# Patient Record
Sex: Male | Born: 1935
Health system: Southern US, Community
[De-identification: ages and names within clinical notes are randomized; demographics above are authoritative.]

## PROBLEM LIST (undated history)

## (undated) DIAGNOSIS — I10 Essential (primary) hypertension: Secondary | ICD-10-CM

## (undated) DIAGNOSIS — R600 Localized edema: Secondary | ICD-10-CM

## (undated) DIAGNOSIS — E785 Hyperlipidemia, unspecified: Secondary | ICD-10-CM

## (undated) DIAGNOSIS — I639 Cerebral infarction, unspecified: Secondary | ICD-10-CM

## (undated) HISTORY — PX: CHOLECYSTECTOMY: SHX55

## (undated) HISTORY — PX: ELBOW FRACTURE SURGERY: SHX616

## (undated) HISTORY — PX: TONSILLECTOMY AND ADENOIDECTOMY: SHX28

## (undated) HISTORY — PX: COLONOSCOPY: SHX174

## (undated) HISTORY — DX: Hyperlipidemia, unspecified: E78.5

## (undated) HISTORY — PX: APPENDECTOMY: SHX54

## (undated) HISTORY — DX: Localized edema: R60.0

---
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
## 2001-01-17 ENCOUNTER — Encounter: Admission: RE | Admit: 2001-01-17 | Discharge: 2001-01-17 | Payer: Self-pay | Admitting: *Deleted

## 2002-01-23 ENCOUNTER — Ambulatory Visit (HOSPITAL_COMMUNITY): Admission: RE | Admit: 2002-01-23 | Discharge: 2002-01-23 | Payer: Self-pay | Admitting: *Deleted

## 2004-08-16 ENCOUNTER — Ambulatory Visit: Payer: Self-pay | Admitting: Internal Medicine

## 2004-10-18 ENCOUNTER — Inpatient Hospital Stay (HOSPITAL_COMMUNITY): Admission: AD | Admit: 2004-10-18 | Discharge: 2004-10-22 | Payer: Self-pay | Admitting: Internal Medicine

## 2004-10-18 ENCOUNTER — Ambulatory Visit: Payer: Self-pay | Admitting: Internal Medicine

## 2004-10-19 ENCOUNTER — Encounter (INDEPENDENT_AMBULATORY_CARE_PROVIDER_SITE_OTHER): Payer: Self-pay | Admitting: *Deleted

## 2004-10-22 ENCOUNTER — Ambulatory Visit: Payer: Self-pay | Admitting: Internal Medicine

## 2004-11-16 ENCOUNTER — Ambulatory Visit: Payer: Self-pay | Admitting: Internal Medicine

## 2005-03-22 ENCOUNTER — Ambulatory Visit: Payer: Self-pay | Admitting: Internal Medicine

## 2005-06-22 ENCOUNTER — Ambulatory Visit: Payer: Self-pay | Admitting: Internal Medicine

## 2005-06-29 ENCOUNTER — Ambulatory Visit: Payer: Self-pay | Admitting: Internal Medicine

## 2005-09-21 ENCOUNTER — Ambulatory Visit: Payer: Self-pay | Admitting: Internal Medicine

## 2005-12-30 ENCOUNTER — Ambulatory Visit: Payer: Self-pay | Admitting: Internal Medicine

## 2006-03-30 ENCOUNTER — Ambulatory Visit: Payer: Self-pay | Admitting: Internal Medicine

## 2006-06-29 ENCOUNTER — Ambulatory Visit: Payer: Self-pay | Admitting: Internal Medicine

## 2006-10-17 ENCOUNTER — Ambulatory Visit: Payer: Self-pay | Admitting: Internal Medicine

## 2006-10-17 LAB — CONVERTED CEMR LAB
AST: 26 units/L (ref 0–37)
Alkaline Phosphatase: 75 units/L (ref 39–117)
Calcium: 9.5 mg/dL (ref 8.4–10.5)
Chloride: 103 meq/L (ref 96–112)
Eosinophils Absolute: 0.2 10*3/uL (ref 0.0–0.6)
Eosinophils Relative: 2.5 % (ref 0.0–5.0)
GFR calc Af Amer: 123 mL/min
GFR calc non Af Amer: 102 mL/min
HDL: 28.3 mg/dL — ABNORMAL LOW (ref >39.0)
Hemoglobin: 15.6 g/dL (ref 13.0–17.0)
Hgb A1c MFr Bld: 6.9 % — ABNORMAL HIGH (ref 4.6–6.0)
Lymphocytes Relative: 23.6 % (ref 12.0–46.0)
MCV: 88.8 fL (ref 78.0–100.0)
Microalb, Ur: 0.8 mg/dL (ref 0.0–1.9)
Monocytes Absolute: 0.6 10*3/uL (ref 0.2–0.7)
Neutro Abs: 4.6 10*3/uL (ref 1.4–7.7)
Neutrophils Relative %: 65.5 % (ref 43.0–77.0)
Potassium: 4.4 meq/L (ref 3.5–5.1)
RDW: 12.3 % (ref 11.5–14.6)
TSH: 2.05 microintl units/mL (ref 0.35–5.50)
Total Bilirubin: 0.8 mg/dL (ref 0.3–1.2)
Triglycerides: 117 mg/dL (ref 0–149)
WBC: 7.1 10*3/uL (ref 4.5–10.5)

## 2006-10-24 ENCOUNTER — Ambulatory Visit: Payer: Self-pay | Admitting: Internal Medicine

## 2006-12-04 ENCOUNTER — Ambulatory Visit: Payer: Self-pay | Admitting: Internal Medicine

## 2006-12-06 ENCOUNTER — Encounter: Payer: Self-pay | Admitting: Internal Medicine

## 2006-12-06 DIAGNOSIS — E119 Type 2 diabetes mellitus without complications: Secondary | ICD-10-CM | POA: Insufficient documentation

## 2006-12-06 DIAGNOSIS — H409 Unspecified glaucoma: Secondary | ICD-10-CM | POA: Insufficient documentation

## 2006-12-06 DIAGNOSIS — N400 Enlarged prostate without lower urinary tract symptoms: Secondary | ICD-10-CM | POA: Insufficient documentation

## 2007-01-29 ENCOUNTER — Encounter: Admission: RE | Admit: 2007-01-29 | Discharge: 2007-02-16 | Payer: Self-pay | Admitting: Orthopedic Surgery

## 2008-05-08 ENCOUNTER — Encounter: Admission: RE | Admit: 2008-05-08 | Discharge: 2008-05-08 | Payer: Self-pay | Admitting: Internal Medicine

## 2008-05-09 ENCOUNTER — Encounter: Admission: RE | Admit: 2008-05-09 | Discharge: 2008-05-09 | Payer: Self-pay | Admitting: Orthopedic Surgery

## 2008-05-14 ENCOUNTER — Ambulatory Visit (HOSPITAL_COMMUNITY): Admission: RE | Admit: 2008-05-14 | Discharge: 2008-05-15 | Payer: Self-pay | Admitting: Orthopedic Surgery

## 2010-11-30 NOTE — Op Note (Signed)
NAMECEDARIUS, KERSH NO.:  [id number]   MEDICAL RECORD NO.:  [id number]          PATIENT TYPE:  OIB   LOCATION:                               FACILITY:  [facility]   PHYSICIAN:  Madelynn Done, MD  DATE OF BIRTH:  May 04, 1934   DATE OF PROCEDURE:  05/14/2008  DATE OF DISCHARGE:                               OPERATIVE REPORT   PREOPERATIVE DIAGNOSES:  1. Right elbow, common type 3 radial head fracture.  2. Right elbow coronoid fracture.   POSTOPERATIVE DIAGNOSES:  1. Right elbow, common type 3 radial head fracture.  2. Right elbow coronoid fracture.   ATTENDING SURGEON:  Sharma Covert IV, MD, who was scrubbed and present  the entire procedure.   ASSISTANT SURGEON:  None.   SURGICAL PROCEDURES:  1. Open treatment of proximal radius fracture with a radial head      arthroplasty.  2. Closed treatment of right proximal olecranon fracture, coronoid      fracture.  3. Stress radiography, 3 views right elbow.  4. Surgical implant neck and 8 mm thick.   TOURNIQUET TIME:  250 mmHg.   INDICATIONS:  Mr. Derrick Wilson is a 77 year old right-hand-dominant gentleman  who sustained injury to his right arm from a fall.  He was evaluated in  the office and noted to have a displaced radial head fracture.  The  patient was counseled about his options, and elected to proceed with the  above procedure.  The benefits and options were discussed in detail with  the patient and signed consent was obtained.   DESCRIPTION OF PROCEDURE:  The patient was properly identified in the  preoperative holding area and mark made with a marker on the right elbow  to indicate correct operative site.  The patient was brought back to the  operating room, placed supine on the anesthesia table, where general  anesthesia was administered via endotracheal tube..  The patient  tolerated this well.  A well-padded tourniquet was then placed on the  right brachium and sealed with 1000 drape.  The patient  received  preoperative antibiotics prior to skin incision.  The right upper  extremity was then prepped and draped in normal sterile fashion.  A time-  out was called and correct site was identified.  The procedure was then  begun.  A lateral approach to the elbow was then used.  Dissection was  carried down through the skin and subcutaneous tissues, Kocher approach.  The interval was identified between the ECU and the interval was then  opened longitudinally to expose the joint.  A capsulotomy was then  carried out and the fracture site was then exposed.  After this exposure  of the fracture site there was noted to have a large degree of  comminution of the radial head.  Attempt was made to reduce the  fragments and was unsuccessful, and it felt necessary to perform then  radial head arthroplasty.  Making a perpendicular cut to the radial  shaft using a small sagittal saw, a radial neck cut was then made.  The  loose fragments  were then removed out of the joint.  The coronoid  fracture was visualized and I was able to continue with the closed  treatment of the coronoid fracture.  Following this, the radial shaft  was then repaired with the appropriate size broach, broaching up to 8  mm.  The head was removed and sized, and found to be 26 mm in diameter.  Following resection of the head with the correct sizing of the head, the  radial neck was then prepared appropriately.  The trial measurements  were then made and it was felt to be necessary for a 26+ to 8-mm stem.  After the trial implant implants were completed, the appropriate implant  was then prepared on the back table, and then placed down the radial  shaft.  The joint was then reduced and the elbow was placed through a  full range of motion with good stability, flexion and extension, as well  as pronation and supination.  Final images were obtained using the mini  C-arm, and felt to be in good position in all planes.  The wound  was  then thoroughly irrigated.  The deep layer of the elbow was then closed  with a 2-0 FiberWire suture and figure-of-eight sutures.  The tourniquet  was then deflated.  The subcutaneous tissues were then closed with 2-0  Vicryl and the skin was then closed with 3-0 nylon horizontal mattress.  Then 20 mL of 0.25% Marcaine were infiltrated locally around the  incision site.  Xeroform dressing, a sterile compressive dressing and a  long-arm splint were then applied.  The patient was then extubated and  taken to the recovery room in good condition.   Intraoperative radiographs revealed the elbow radial head arthroplasty  placed.  Good position and maintenance of the alignment of the coronoid,  and no joint incongruity.   POSTOPERATIVE PLAN:  The patient will be admitted for IV antibiotics and  pain control.  I will see him back in the office in approximately 10  days for radiographs to the elbow and then begin a therapy protocol to  begin motion and use the elbow.      Madelynn Done, MD  Electronically Signed     FWO/MEDQ  D:  05/14/2008  T:  05/14/2008  Job:  [id number]

## 2010-12-03 NOTE — H&P (Signed)
[name], [name] NO.:  [id number]   MEDICAL RECORD NO.:  [id number]          PATIENT TYPE:  INP   LOCATION:  5735                         FACILITY:  [facility]   PHYSICIAN:  Anselm Pancoast. Weatherly, M.D.DATE OF BIRTH:  March 09, 1934   DATE OF ADMISSION:  10/18/2004  DATE OF DISCHARGE:                                HISTORY & PHYSICAL   TOTALLY INAUDIBLE DICTATION      WJW/MEDQ  D:  10/19/2004  T:  10/19/2004  Job:  [id number]

## 2010-12-03 NOTE — Op Note (Signed)
Derrick Wilson, Derrick Wilson NO.:  [id number]   MEDICAL RECORD NO.:  [id number]          PATIENT TYPE:  INP   LOCATION:  5735                         FACILITY:  [facility]   PHYSICIAN:  Anselm Pancoast. Weatherly, M.D.DATE OF BIRTH:  1933-09-27   DATE OF PROCEDURE:  10/19/2004  DATE OF DISCHARGE:                                 OPERATIVE REPORT   PREOPERATIVE DIAGNOSIS:  Acute cholecystitis with stones.   POSTOPERATIVE DIAGNOSIS:  Acute cholecystitis.   OPERATION/PROCEDURE:  Laparoscopic cholecystectomy with cholangiogram.   ANESTHESIA:  General.   SURGEON:  Anselm Pancoast. Zachery Dakins, M.D.   ASSISTANT:  Gabrielle Dare. Janee Morn, M.D.   HISTORY:  Mr. Quame Spratlin is a 77 year old male who is a diabetic was  admitted yesterday afternoon by Dr. Eleonore Chiquito with acute  cholecystitis.  Surgical consultation was requested this morning.  On exam  the patient was significantly tender in the right upper quadrant with some  fullness.  Ultrasound yesterday afternoon had shown an acutely inflamed  gallbladder.  He has had temperature up to about 102 and was started on  Unasyn yesterday and his white count was 17,000 yesterday.  I recommend that  we proceed on with a urgent cholecystectomy.  I saw the patient probably  about 10 a.m. and added him to the OR schedule.  He was given a dose of  Unasyn immediately preoperatively.  His liver tests were normal with the  exception that his bilirubin was about 3 and on ultrasound the gallbladder  was acutely inflamed.  The cystic duct was not dilated and I thought that  this was probably just markedly inflamed gallbladder.   DESCRIPTION OF PROCEDURE:  The patient was given dose of Unasyn immediately  prior to going back to the operative suite and induction of general  anesthesia.  The abdomen was shaved around the port sites and a Betadine  prep and then draped in a sterile manner.   A small incision was made around the infraumbilical area.  The  fascia was  identified and picked up between two Kochers.  A small opening was made and  entered carefully into the peritoneal cavity.  A pursestring suture of 0  Vicryl was placed and the Hasson cannula introduced.  The gallbladder was  acutely inflamed.  All adherent up to the anterior intra-abdominal surface.  Fortunately I could see the falciform ligament subxiphoid area.  A 10 mm  trocar under direct vision was placed carefully at this level.  Next it was  necessary to use hot scissors to kind of free the adhesions from where the  gallbladder was markedly inflamed.  Obviously he has had previous episodes  even though the patient denied it.  He had also had an open appendectomy  years ago. He had a lot of adhesions to that and these were kind of taken  down so that we could get the 5 mm trocar placed by Dr. Janee Morn.  With  this, we then kind of peeled this very thick omentum off the markedly  inflamed gallbladder and we could this frankly nearly necrotic gallbladder.  We peeled the omentum off  medially and laterally and then used a __________  aspirator to aspirate the gallbladder and then could grasp it with grasper.  We continued kind of dissecting free and up and then I put another 5 mm port  to the left of the midline so that I could use it to sucker to kind of push  down on the omentum since visualization was still very limited and we also  had switched to the 30-degree scope.  We then placed the second 5 mm trocar  on the right subcostal area and then kept gradually dissecting, working down  to the proximal portion of the gallbladder.  Finally, we got to the most  dependent portion of the gallbladder and carefully dissected and identified  our little short cystic duct, probably about 1 cm in length and could not  see the cystic artery but I was sure that this was cystic duct since it  joined definitely a very inflamed edematous gallbladder and was placed a  clip flush with the  gallbladder, made a little opening and just proximal put  in a Cook catheter into the proximal cystic duct, held in place with clip.  Cholangiogram was obtained which showed good fill of the extrahepatic  biliary system, a very small cystic duct had been demonstrated with the  ultrasound.  The catheter showed good flow into the small bowel.  It did  show the intrahepatic radicals and we removed the catheter, then kind of  triply clipped the little short cystic duct.   Next, the cystic artery, after dividing it, we could tease and most likely  we clipped both the anterior and posterior branches of the cystic artery  since we did not dissect down in this markedly inflamed area but clipped  these proximally, then divided them.  The gallbladder was very difficult to  remove because of the marked inflammation.  Everything was very woody and at  the most distal portion of the gallbladder, there was a little venous  bleeding up into the liver that took cauterization to get hemostasis.  We  thoroughly irrigated, aspirated, reinspected.  I used a little Surgicel for  a few minutes to hold this and then removed it, cauterized the area again  and finally got good hemostasis.   Next, the gallbladder was then placed into an EndoCatch bag and we switched  the camera to the upper 10 mm port, withdrew the bag containing the  gallbladder and then cultured the bile aerobically and anaerobically.  Next,  we reinserted the Hasson cannula in the umbilicus, went ahead and put a #19  Blake drain through the lateral 5 mm port.  I cut it off and then put in the  gallbladder fossa area and then sutured it to the skin.  Next, the  reinspection and the hemostasis appeared adequate.  I put the camera back to  the upper 10 mm port, withdrew the Hasson cannula and then sutured the  fascia of the umbilicus with an additional figure-of-eight suture of 0 Vicryl, and then removed the 5 mm ports.  The drain was in good  position and  removed the upper 10 mm port.  Subcutaneous wounds were closed with 4-0  Vicryl and Benzoin and Steri-Strips for the skin.   We will continue the patient on broad antibiotic coverage and I do not think  there is any way he will go home tomorrow.  I expect we will keep the drain  in for two or three days and probably  discharge him with the drain.  Hopefully, he will not have any problems with bleeding.  He is not on any  type of anticoagulation and we will continue the PAS stockings.  Check an  Accu-Chek in the recovery room.  He is normally on just an oral insulin  medication but he may require a little insulin coverage if his sugar is over  about 200.       ___________________________________________  Anselm Pancoast. Zachery Dakins, M.D.    WJW/MEDQ  D:  10/19/2004  T:  10/20/2004  Job:  [id number]   cc:   Gordy Savers, M.D. [facility]

## 2010-12-03 NOTE — Discharge Summary (Signed)
NAME:  Derrick Wilson, Derrick Wilson             ACCOUNT NO.:  [id number]   MEDICAL RECORD NO.:  [id number]          PATIENT TYPE:  INP   LOCATION:  5735                         FACILITY:  [facility]   PHYSICIAN:  Valetta Mole. Swords, M.D. [facility] OF BIRTH:  1934/03/24   DATE OF ADMISSION:  10/18/2004  DATE OF DISCHARGE:  10/22/2004                                 DISCHARGE SUMMARY   DISCHARGE DIAGNOSES:  1.  Gangrenous cholecystitis, status post laparoscopic cholecystectomy.  2.  Diabetes.  3.  Hypertension, new start Atenolol.  4.  Glaucoma.  5.  History of appendectomy and tonsillectomy.   DISCHARGE MEDICATIONS:  1.  Metformin 1 gram p.o. every day.  2.  Glyburide 5 mg p.o. every day.  3.  Aspirin 81 mg daily.  4.  Vicodin p.r.n.  5.  Augmentin 500 mg p.o. b.i.d.  6.  Atenolol 25 mg p.o. b.i.d.  7.  He will continue eye drops for glaucoma.   HOSPITAL PROCEDURES:  Laparoscopic cholecystectomy, Jackson-Pratt drain  still in place.   FOLLOWUP PLANS:  1.  Dr. Zachery Dakins, Monday, 12:30 p.m.  2.  He will follow up with Dr. Amador Cunas in 2-3 weeks.   DISCHARGE LABORATORIES:  C-MET, on discharge, significant for bilirubin 1.6,  protein 5.6, albumin 2.2, and calcium 8.3.  CBC, on discharge, with a white  count of 13.1, hemoglobin 13.3, platelet count 255,000.   HOSPITAL COURSE:  The patient was admitted to the hospitalist service, on  October 18, 2004, with presumptive diagnosis of cholecystitis.  The patient  underwent laparoscopic cholecystectomy, after an abnormal ultrasound, after  abnormal laboratories reviewed.  Dr. Zachery Dakins surgeon.  Diabetes stayed well controlled.  Hypertension. The patient started on atenolol 25 mg p.o. every day that  increased to 25 mg p.o. b.i.d. at the time of discharge and follow up with  Dr. Amador Cunas in 2-3 weeks.      BHS/MEDQ  D:  10/22/2004  T:  10/22/2004  Job:  [id number]   cc:   Gordy Savers, M.D. [facility]   Anselm Pancoast. Zachery Dakins, M.D.  [address]  [place]  [place] [address]

## 2010-12-03 NOTE — Procedures (Signed)
[facility]  Patient:    Derrick Wilson, Derrick Wilson Visit Number: [id number] MRN: [id number]          Service Type: END Location: ENDO Attending Physician:  Sabino Gasser Dictated by:   Sabino Gasser, M.D. Proc. Date: 01/23/02 Admit Date:  01/23/2002 Discharge Date: 01/23/2002                             Procedure Report  PROCEDURE:  Colonoscopy.  INDICATION FOR PROCEDURE:  Colon cancer screening, rectal bleeding.  ANESTHESIA:  Demerol 60, Versed 7.5 mg.  DESCRIPTION OF PROCEDURE:  With the patient mildly sedated in the left lateral decubitus position, the Olympus videoscopic colonoscope was inserted in the rectum after a normal rectum exam and passed under direct vision to the cecum. identified by the ileocecal valve and appendiceal orifice. We entered into the terminal ileum which also appeared normal. From this point, the colonoscope was slowly withdrawn taking circumferential views of the entire colonic mucosa, stopping only in the rectum which appeared normal on direct and showed hemorrhoids on retroflexed view. The endoscope was straightened and withdrawn. The patients vital signs and pulse oximeter remained stable. The patient tolerated the procedure well without apparent complications.  FINDINGS:  Internal hemorrhoids, otherwise, unremarkable colonoscopic examination to the cecum.  PLAN:  Have the patient follow-up with me as needed. Dictated by:   Sabino Gasser, M.D. Attending Physician:  Sabino Gasser DD:  01/23/02 TD:  01/26/02 Job: [id number]

## 2010-12-03 NOTE — H&P (Signed)
[name], [name] NO.:  [id number]   MEDICAL RECORD NO.:  [id number]          PATIENT TYPE:  INP   LOCATION:  5735                         FACILITY:  [facility]   PHYSICIAN:  Gordy Savers, M.D. LHCDATE OF BIRTH:  1933-11-14   DATE OF ADMISSION:  10/18/2004  DATE OF DISCHARGE:                                HISTORY & PHYSICAL   CHIEF COMPLAINT:  Right-sided abdominal pain.   HISTORY OF PRESENT ILLNESS:  The patient is a 77 year old gentleman who  presents with a three-day history of right-sided abdominal pain.  This is  described as sharp and fairly constant.  This has been associated with  occasional episodes of emesis, weakness, and anorexia.  He denies any fever  or chills.   PAST MEDICAL HISTORY:  1.  The patient has had a remote appendectomy in 1944 and a tonsillectomy in      1947.  2.  He has a greater than 15-year history of diabetes.  3.  History of glaucoma.   He has no known allergies.   He is a nonsmoker.  No ethanol.   MEDICAL REGIMEN:  1.  Metformin 1 g daily.  2.  Glyburide 5 mg daily.  3.  Aspirin 81 mg daily.  4.  He is on two antiglaucomatous drops.   REVIEW OF SYSTEMS:  Fairly unremarkable.  He is followed by Dr. Linward Foster for his eye care.  Last colonoscopy was in 2002.  Diabetic control  has been good, with his last hemoglobin A1c of 5.9.   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  He  is married, with one son.   FAMILY HISTORY:  Father died at 65 of a stroke.  Mother died at 51.  Three  sisters in good health.  No family history of cancer.   EXAMINATION:  GENERAL:  Moderately overweight male who appeared unwell but  in no acute distress.  VITAL SIGNS:  Temperature 102.1 degrees; weight 248; blood pressure 128/68.  SKIN:  Slightly diaphoretic.  HEAD/NECK:  Revealed no scleral icterus.  Head and neck unremarkable.  NECK:  No bruits or adenopathy.  CHEST:  Clear.  CARDIOVASCULAR:   Revealed normal S1, S2.  Rate was about 100.  ABDOMEN:  Revealed some tenderness along the right midabdominal area, with  some mild voluntary guarding.  No rebound tenderness noted.  Bowel sounds  were present.  EXTREMITIES:  Revealed full peripheral pulses, without edema.  NEUROLOGIC:  Negative.   IMPRESSION:  Fever.  Right-sided abdominal pain.  Rule out cholecystitis.   DISPOSITION:  The patient will be admitted to the hospital, made n.p.o., and  supported with IV fluids.  Laboratory evaluation, including an abdominal  ultrasound, will be reviewed.      PFK/MEDQ  D:  10/18/2004  T:  10/18/2004  Job:  [id number]

## 2011-04-19 LAB — BASIC METABOLIC PANEL
BUN: 20
CO2: 29
Calcium: 9.2
Chloride: 103
Creatinine, Ser: 0.88
GFR calc Af Amer: >60
Glucose, Bld: 179 — ABNORMAL HIGH
Potassium: 4.5
Sodium: 138

## 2011-04-19 LAB — CBC
HCT: 44.7
Hemoglobin: 15
MCV: 89.6
RDW: 13.1

## 2011-04-19 LAB — GLUCOSE, CAPILLARY
Glucose-Capillary: 163 — ABNORMAL HIGH
Glucose-Capillary: 176 — ABNORMAL HIGH
Glucose-Capillary: 211 — ABNORMAL HIGH
Glucose-Capillary: 246 — ABNORMAL HIGH

## 2011-10-31 ENCOUNTER — Encounter: Payer: Self-pay | Admitting: Internal Medicine

## 2011-12-13 ENCOUNTER — Ambulatory Visit (AMBULATORY_SURGERY_CENTER): Payer: Medicare Other

## 2011-12-13 VITALS — Ht 69.0 in | Wt 245.0 lb

## 2011-12-13 DIAGNOSIS — Z1211 Encounter for screening for malignant neoplasm of colon: Secondary | ICD-10-CM

## 2011-12-13 MED ORDER — PEG-KCL-NACL-NASULF-NA ASC-C 100 G PO SOLR: 1.0000 | Freq: Once | ORAL | Status: AC

## 2011-12-13 NOTE — Progress Notes (Signed)
Pt came into the office today for his pre-visit prior to his colonoscopy with Dr Marina Goodell on 12/26/11. He states he had a colonoscopy done 11 years ago but does not remember the doctor's name. Ulis Rias RN

## 2011-12-26 ENCOUNTER — Ambulatory Visit (AMBULATORY_SURGERY_CENTER): Payer: Medicare Other | Admitting: Internal Medicine

## 2011-12-26 ENCOUNTER — Encounter: Payer: Self-pay | Admitting: Internal Medicine

## 2011-12-26 VITALS — BP 124/68 | HR 86 | Temp 98.6°F | Resp 22 | Ht 69.0 in | Wt 245.0 lb

## 2011-12-26 DIAGNOSIS — D126 Benign neoplasm of colon, unspecified: Secondary | ICD-10-CM

## 2011-12-26 DIAGNOSIS — Z1211 Encounter for screening for malignant neoplasm of colon: Secondary | ICD-10-CM

## 2011-12-26 MED ORDER — SODIUM CHLORIDE 0.9 % IV SOLN: 500.0000 mL | INTRAVENOUS | Status: DC

## 2011-12-26 NOTE — Patient Instructions (Signed)
YOU HAD AN ENDOSCOPIC PROCEDURE TODAY AT THE [facility]: Refer to the procedure report that was given to you for any specific questions about what was found during the examination.  If the procedure report does not answer your questions, please call your gastroenterologist to clarify.  If you requested that your care partner not be given the details of your procedure findings, then the procedure report has been included in a sealed envelope for you to review at your convenience later.  YOU SHOULD EXPECT: Some feelings of bloating in the abdomen. Passage of more gas than usual.  Walking can help get rid of the air that was put into your GI tract during the procedure and reduce the bloating. If you had a lower endoscopy (such as a colonoscopy or flexible sigmoidoscopy) you may notice spotting of blood in your stool or on the toilet paper. If you underwent a bowel prep for your procedure, then you may not have a normal bowel movement for a few days.  DIET: Your first meal following the procedure should be a light meal and then it is ok to progress to your normal diet.  A half-sandwich or bowl of soup is an example of a good first meal.  Heavy or fried foods are harder to digest and may make you feel nauseous or bloated.  Likewise meals heavy in dairy and vegetables can cause extra gas to form and this can also increase the bloating.  Drink plenty of fluids but you should avoid alcoholic beverages for 24 hours.  ACTIVITY: Your care partner should take you home directly after the procedure.  You should plan to take it easy, moving slowly for the rest of the day.  You can resume normal activity the day after the procedure however you should NOT DRIVE or use heavy machinery for 24 hours (because of the sedation medicines used during the test).    SYMPTOMS TO REPORT IMMEDIATELY: A gastroenterologist can be reached at any hour.  During normal business hours, 8:30 AM to 5:00 PM Monday through Friday,  call [phone].  After hours and on weekends, please call the GI answering service at [phone] who will take a message and have the physician on call contact you.   Following lower endoscopy (colonoscopy or flexible sigmoidoscopy):  Excessive amounts of blood in the stool  Significant tenderness or worsening of abdominal pains  Swelling of the abdomen that is new, acute  Fever of 100F or higher    FOLLOW UP: If any biopsies were taken you will be contacted by phone or by letter within the next 1-3 weeks.  Call your gastroenterologist if you have not heard about the biopsies in 3 weeks.  Our staff will call the home number listed on your records the next business day following your procedure to check on you and address any questions or concerns that you may have at that time regarding the information given to you following your procedure. This is a courtesy call and so if there is no answer at the home number and we have not heard from you through the emergency physician on call, we will assume that you have returned to your regular daily activities without incident.  SIGNATURES/CONFIDENTIALITY: You and/or your care partner have signed paperwork which will be entered into your electronic medical record.  These signatures attest to the fact that that the information above on your After Visit Summary has been reviewed and is understood.  Full responsibility of the confidentiality   of this discharge information lies with you and/or your care-partner.    INFORMATION ON POLYPS, DIVERTICULOSIS, AND HIGH FIBER DIET GIVEN TO YOU TODAY.   ,

## 2011-12-26 NOTE — Op Note (Signed)
[facility] [address] [place], [place]  [address]  COLONOSCOPY PROCEDURE REPORT  PATIENT:  Derrick Wilson, Derrick Wilson  MR#:  [id number] BIRTHDATE:  [birth year], 78 yrs. old  GENDER:  male ENDOSCOPIST:  Wilhemina Bonito. Eda Keys, MD REF. BY:  Ivery Quale, M.D. PROCEDURE DATE:  12/26/2011 PROCEDURE:  Colonoscopy with snare polypectomy x 1 ASA CLASS:  Class II INDICATIONS:  Routine Risk Screening ; He reports a negative exam about 11 years ago (Dr Virginia Rochester) MEDICATIONS:   MAC sedation, administered by CRNA, propofol (Diprivan) 280 mg IV  DESCRIPTION OF PROCEDURE:   After the risks benefits and alternatives of the procedure were thoroughly explained, informed consent was obtained.  Digital rectal exam was performed and revealed no abnormalities.   The LB CF-H180AL [id number] endoscope was introduced through the anus and advanced to the cecum, which was identified by both the appendix and ileocecal valve, without limitations.  The quality of the prep was excellent, using MoviPrep.  The instrument was then slowly withdrawn as the colon was fully examined. <<PROCEDUREIMAGES>>  FINDINGS:  A diminutive polyp was found in the cecum and snared without cautery. Retrieval was successful.  Mild diverticulosis was found in the sigmoid colon.  Otherwise normal colonoscopy without other polyps, masses, vascular ectasias, or inflammatory changes.   Retroflexed views in the rectum revealed no abnormalities.    The time to cecum =   6:50  minutes. The scope was then withdrawn in   13:40  minutes from the cecum and the procedure completed.  COMPLICATIONS:  None  ENDOSCOPIC IMPRESSION: 1) Diminutive polyp in the cecum - removed 2) Mild diverticulosis in the sigmoid colon 3) Otherwise normal colonoscopy  RECOMMENDATIONS: 1) Return to the care of your primary provider. GI follow up as needed  ______________________________ Wilhemina Bonito. Eda Keys, MD  CC:  Jarome Matin, MD;  The Patient  n. eSIGNED:    Wilhemina Bonito. Eda Keys at 12/26/2011 11:00 AM  Felicity Pellegrini, [phone]

## 2011-12-26 NOTE — Progress Notes (Signed)
Patient did not have preoperative order for IV antibiotic SSI prophylaxis. (G8918)  Patient did not experience any of the following events: a burn prior to discharge; a fall within the facility; wrong site/side/patient/procedure/implant event; or a hospital transfer or hospital admission upon discharge from the facility. (G8907)  

## 2011-12-27 ENCOUNTER — Telehealth: Payer: Self-pay | Admitting: *Deleted

## 2011-12-27 NOTE — Telephone Encounter (Signed)
  Follow up Call-  Call back number 12/26/2011  Post procedure Call Back phone  # [phone]  Permission to leave phone message Yes     No answer, phone rang but no answering machine picked up to leave message.

## 2011-12-30 ENCOUNTER — Encounter: Payer: Self-pay | Admitting: Internal Medicine

## 2014-05-08 ENCOUNTER — Encounter: Payer: Self-pay | Admitting: Podiatry

## 2014-05-08 ENCOUNTER — Ambulatory Visit (INDEPENDENT_AMBULATORY_CARE_PROVIDER_SITE_OTHER): Payer: Medicare Other | Admitting: Podiatry

## 2014-05-08 ENCOUNTER — Ambulatory Visit (INDEPENDENT_AMBULATORY_CARE_PROVIDER_SITE_OTHER): Payer: Medicare Other

## 2014-05-08 VITALS — BP 152/77 | HR 81 | Temp 97.6°F | Resp 14 | Ht 68.5 in | Wt 231.0 lb

## 2014-05-08 DIAGNOSIS — L03032 Cellulitis of left toe: Secondary | ICD-10-CM

## 2014-05-08 DIAGNOSIS — M79675 Pain in left toe(s): Secondary | ICD-10-CM

## 2014-05-08 MED ORDER — CEPHALEXIN 500 MG PO CAPS: 500.0000 mg | ORAL_CAPSULE | Freq: Three times a day (TID) | ORAL | Status: DC

## 2014-05-08 NOTE — Progress Notes (Signed)
Subjective:     Patient ID: Derrick Wilson, male   DOB: [birth year], 80 y.o.   MRN: [id number]  HPI patient presents stating that he thinks he did something to his left big toe around a week ago and the nail has been loose and the toe has been red left big toe. He does have neuropathy Derrick Wilson is not sure but he did dragging against the carpet and may have traumatized   Review of Systems  All other systems reviewed and are negative.      Objective:   Physical Exam  Nursing note and vitals reviewed. Cardiovascular: Intact distal pulses.   Musculoskeletal: Normal range of motion.  Neurological: He is alert.  Skin: Skin is warm and dry.   neurovascular status found to be diminished but intact with diminished sharp tall and vibratory and mild diminishment of pulses bilateral. I noted that the toes are well perfused and I did note mild warmth in the left big toe and extending into the metatarsophalangeal joint. The left big toe is moderately erythematous and the nail is loose with mild localized odor and there is keratotic tissue on the hallux itself     Assessment:     Abscess of the left big nail with possible cellulitic issue or trauma to the left big toe secondary to injury that he was not well aware of do to long-term neuropathy and 25 year history of diabetes    Plan:     H&P and x-ray reviewed. I discussed with him condition and today I infiltrated 100 mg I can Marcaine proximal to the MPJs and I then removed the nail and I did not note any drainage or anything for me to culture. I removed crusted tissue from the left big toe and flushed the area and applied sterile dressing and placed him on cephalexin 500 mg 3 times a day. Gave him strict instructions to check his temperature daily if he should develop any fever or indications of systemic infection he is to go straight to the emergency room or if he should develop redness in his foot or other conditions. I instructed him on soaks and he  will reappoint in 1 week

## 2014-05-08 NOTE — Patient Instructions (Signed)

## 2014-05-08 NOTE — Progress Notes (Signed)
   Subjective:    Patient ID: Derrick Wilson, male    DOB: 17-Jan-1934, 80 y.o.   MRN: [id number]  HPI Comments: Pt states he caught his left 1st toe in the bath rug, 1 week ago.  Pt has treated toe with epsom salt soaks and ice, without improvement.  Pt's left 1st toe is swollen, red and peeling, and the toenail has blood subungal.     Review of Systems  All other systems reviewed and are negative.      Objective:   Physical Exam        Assessment & Plan:

## 2014-05-09 ENCOUNTER — Telehealth: Payer: Self-pay | Admitting: *Deleted

## 2014-05-09 NOTE — Telephone Encounter (Signed)
Patient called wanting to know if he should continue with his therapy till next visit , spoke to Quintarius told him to continue til next visit

## 2014-05-15 ENCOUNTER — Telehealth: Payer: Self-pay | Admitting: *Deleted

## 2014-05-15 ENCOUNTER — Ambulatory Visit (INDEPENDENT_AMBULATORY_CARE_PROVIDER_SITE_OTHER): Payer: Medicare Other

## 2014-05-15 ENCOUNTER — Encounter: Payer: Self-pay | Admitting: Podiatry

## 2014-05-15 ENCOUNTER — Ambulatory Visit (INDEPENDENT_AMBULATORY_CARE_PROVIDER_SITE_OTHER): Payer: Medicare Other | Admitting: Podiatry

## 2014-05-15 VITALS — BP 119/62 | HR 77 | Temp 99.4°F | Resp 16

## 2014-05-15 DIAGNOSIS — L03032 Cellulitis of left toe: Secondary | ICD-10-CM

## 2014-05-15 MED ORDER — DOXYCYCLINE HYCLATE 100 MG PO TABS: 100.0000 mg | ORAL_TABLET | Freq: Two times a day (BID) | ORAL | Status: DC

## 2014-05-15 MED ORDER — LEVOFLOXACIN 500 MG PO TABS: 750.0000 mg | ORAL_TABLET | Freq: Every day | ORAL | Status: DC

## 2014-05-15 NOTE — Progress Notes (Signed)
Subjective:     Patient ID: Derrick Wilson, male   DOB: September 28, 1933, 80 y.o.   MRN: [id number]  HPI patient presents stating maybe it's a little bit better but it still sore. This patient does have 30 year history of diabetes which is been under good control and states that his sugar has been relatively normal the last week. Also states that he has not been taking his temperature as I instructed and I did check it today and it was 99.5   Review of Systems     Objective:   Physical Exam Neurovascular status found to be intact with muscle strength adequate and range of motion within normal limits. Patient is found to have continued calor and continued erythema around the left big toe with no odor noted or drainage and it extends around the metatarsal phalangeal joint patient is not noted to have any erythema or edema into the ankle or lower leg at this time. I did not note any active drainage currently    Assessment:     Continued infective process of the left big toe with no active drainage but still of concern with slight elevation of temperature    Plan:     X-rayed his foot and spent a great deal time educating him that there is a strong possibility he may have some bone infection. We are going to switch him to a much stronger oral antibiotic regimen and I gave him strict instructions to take his temperature every day and to watch the redness pattern on his foot and if it were to advance I want him to go straight to the hospital for IV antibiotics. I also advised that there is a chance he is in the loose his big toe due to the fact that it appears based on serial x-rays that there may be bone infection present. Placed on Levaquin 750 mg daily and doxycycline today and will reappoint in 1 week and he was given my cell number if there should be any issues that occur

## 2014-05-15 NOTE — Patient Instructions (Signed)

## 2014-05-15 NOTE — Telephone Encounter (Signed)
"  I'm calling in regards to two prescriptions that were written for antibiotics.  Patient is asking if he is to take them at the same time or separately."  I told him patient is to take both of them at the same time according to Dr. Mellody Drown note.  I verified with Dr. Paulla Dolly.

## 2014-05-22 ENCOUNTER — Ambulatory Visit (INDEPENDENT_AMBULATORY_CARE_PROVIDER_SITE_OTHER): Payer: Medicare Other | Admitting: Podiatry

## 2014-05-22 ENCOUNTER — Encounter: Payer: Self-pay | Admitting: Podiatry

## 2014-05-22 VITALS — BP 112/58 | HR 75 | Temp 98.9°F | Resp 16

## 2014-05-22 DIAGNOSIS — L03032 Cellulitis of left toe: Secondary | ICD-10-CM

## 2014-05-22 NOTE — Progress Notes (Signed)
Subjective:     Patient ID: Derrick Wilson, male   DOB: [birth year], 80 y.o.   MRN: [id number]  HPIpatient presents stating my foot been feeling a lot better this week and my temperature has been normal and today it was 98.5. I've noticed less drainage swelling and no discomfort   Review of Systems     Objective:   Physical Exam Neurovascular status intact with continued forefoot edema left but significant reduced calor and erythema. The left hallux itself shows no current drainage and there is no crusted tissue with no down of skin noted. There is no proximal edema erythema or lymph no distention noted    Assessment:     Appears to be responding currently oral antibiotic treatment    Plan:     Reviewed condition and continuation of antibiotics for the next 10 days. Reappoint one week or we will reevaluate and re-x-ray the left foot. Instructed if temperature should arise or any indications of cellulitic process to go straight to the emergency room and contact us

## 2014-05-23 ENCOUNTER — Other Ambulatory Visit: Payer: Self-pay | Admitting: *Deleted

## 2014-05-23 NOTE — Telephone Encounter (Signed)
[facility] sent over a refill request for Levofloxacin 500 mg.  Dr. Josephina Shih the refill with 2 additional refills.

## 2014-05-28 ENCOUNTER — Other Ambulatory Visit: Payer: Self-pay | Admitting: Podiatry

## 2014-05-29 ENCOUNTER — Ambulatory Visit (INDEPENDENT_AMBULATORY_CARE_PROVIDER_SITE_OTHER): Payer: Medicare Other

## 2014-05-29 ENCOUNTER — Ambulatory Visit (INDEPENDENT_AMBULATORY_CARE_PROVIDER_SITE_OTHER): Payer: Medicare Other | Admitting: Podiatry

## 2014-05-29 ENCOUNTER — Encounter: Payer: Self-pay | Admitting: Podiatry

## 2014-05-29 VITALS — BP 133/63 | HR 74 | Temp 98.8°F | Resp 16

## 2014-05-29 DIAGNOSIS — R609 Edema, unspecified: Secondary | ICD-10-CM

## 2014-05-29 DIAGNOSIS — L03032 Cellulitis of left toe: Secondary | ICD-10-CM

## 2014-05-29 MED ORDER — LEVOFLOXACIN 500 MG PO TABS: 500.0000 mg | ORAL_TABLET | Freq: Every day | ORAL | Status: DC

## 2014-05-29 MED ORDER — DOXYCYCLINE HYCLATE 100 MG PO TABS: 100.0000 mg | ORAL_TABLET | Freq: Two times a day (BID) | ORAL | Status: DC

## 2014-05-29 NOTE — Progress Notes (Signed)
Subjective:     Patient ID: Derrick Wilson, male   DOB: 1934-03-08, 80 y.o.   MRN: [id number]  HPIpatient states the toe is continuing to improve he still has some swelling in his foot but he's had no discomfort no noted redness has been checking his temperature daily and it's been running normal   Review of Systems     Objective:   Physical Exam Neurovascular status intact with continued forefoot edema left with no drainage currently noted from the left hallux and no indications of proximal edema erythema or drainage noted or systemic signs of infection    Assessment:     Continues to improve from of infection of the left hallux with still the possibility for osteomyelitis based on x-ray evaluation    Plan:     Re-x-rayed today and at this time told him to continue to keep taking anti biotics for several more weeks and at this point I'm very hopeful that this will completely resolve. Due to the edema I did go ahead and I placed him in an Unna boot today and Ace wrap in order to compress the foot and instructed him to keep it on for 3 days. He will take continued  Doxycycline and Levaquin for the next 3 weeks and will be seen back at that time unless there is any issues

## 2014-06-19 ENCOUNTER — Encounter: Payer: Self-pay | Admitting: Podiatry

## 2014-06-19 ENCOUNTER — Ambulatory Visit (INDEPENDENT_AMBULATORY_CARE_PROVIDER_SITE_OTHER): Payer: Medicare Other

## 2014-06-19 ENCOUNTER — Ambulatory Visit (INDEPENDENT_AMBULATORY_CARE_PROVIDER_SITE_OTHER): Payer: Medicare Other | Admitting: Podiatry

## 2014-06-19 VITALS — BP 115/61 | HR 70 | Resp 16

## 2014-06-19 DIAGNOSIS — M79675 Pain in left toe(s): Secondary | ICD-10-CM

## 2014-06-19 DIAGNOSIS — R609 Edema, unspecified: Secondary | ICD-10-CM

## 2014-06-19 DIAGNOSIS — L03032 Cellulitis of left toe: Secondary | ICD-10-CM

## 2014-06-19 NOTE — Progress Notes (Signed)
Subjective:     Patient ID: Derrick Wilson, male   DOB: 1934-06-03, 80 y.o.   MRN: [id number]  HPI patient states my toe seems to be doing much better and I'm not had any drainage or pain with it and I am almost done with my antibiotics   Review of Systems     Objective:   Physical Exam Neurovascular status intact muscle strength was within normal limits and I noted the left hallux the erythema continues to reduce as does the edema with the nailbed growing back with what appears to be good health and no proximal edema erythema or drainage noted currently    Assessment:     Appears to be doing well with infection of the left big toe with possible bone involvement    Plan:     X-rays were reviewed and I did discuss that there continues to be change in the proximal phalanx and distal phalanx and want him to stay on antibiotics at the current time and then we will stop and if redness were to occur swelling or proximal erythema edema it is very likely he will have to have the left hallux amputated for osteomyelitis. Clinically he is doing excellent so  i'm hopeful that this will die out and I am planning on seeing him in 5 weeks but strict instructions that I will see him earlier if any issues should occur and he will stop his antibiotics in 2 weeks and we will see how he does with no antibiotic treatment

## 2014-07-24 ENCOUNTER — Ambulatory Visit: Payer: Medicare Other | Admitting: Podiatry

## 2014-07-24 ENCOUNTER — Ambulatory Visit (INDEPENDENT_AMBULATORY_CARE_PROVIDER_SITE_OTHER): Payer: Medicare Other | Admitting: Podiatry

## 2014-07-24 VITALS — BP 128/66 | HR 58 | Resp 16

## 2014-07-24 DIAGNOSIS — M79675 Pain in left toe(s): Secondary | ICD-10-CM

## 2014-07-24 DIAGNOSIS — L03032 Cellulitis of left toe: Secondary | ICD-10-CM

## 2014-07-25 NOTE — Progress Notes (Signed)
Subjective:     Patient ID: Derrick Wilson, male   DOB: [birth year], 81 y.o.   MRN: [id number]  HPI patient presents stating my big toe is improving with diminished redness and swelling and while it's lower than it should be I'm able to walk without discomfort   Review of Systems     Objective:   Physical Exam Neurovascular status intact with significant improvement in the appearance of the left big toe with no drainage noted and minimal erythema or edema noted. I did note dysfunction of the extensor hallucis longus tendon    Assessment:     Doing well with possibility for osteomyelitic process left big toe that appears to be under control with a probable tear of the extensor hallucis longus tendon which she does not remember any history of trauma    Plan:     Discussed condition and at this point I want to keep him in a good stable shoe and explained if he should start to stumble on his toe we may need to treat the dysfunction of the extensor tendon. We will watch the infective condition and if any issues should occur he is to let us know immediately

## 2015-09-03 DIAGNOSIS — I10 Essential (primary) hypertension: Secondary | ICD-10-CM | POA: Diagnosis not present

## 2015-09-03 DIAGNOSIS — E1151 Type 2 diabetes mellitus with diabetic peripheral angiopathy without gangrene: Secondary | ICD-10-CM | POA: Diagnosis not present

## 2015-09-03 DIAGNOSIS — E784 Other hyperlipidemia: Secondary | ICD-10-CM | POA: Diagnosis not present

## 2015-09-03 DIAGNOSIS — Z125 Encounter for screening for malignant neoplasm of prostate: Secondary | ICD-10-CM | POA: Diagnosis not present

## 2015-09-10 DIAGNOSIS — Z6835 Body mass index (BMI) 35.0-35.9, adult: Secondary | ICD-10-CM | POA: Diagnosis not present

## 2015-09-10 DIAGNOSIS — E11319 Type 2 diabetes mellitus with unspecified diabetic retinopathy without macular edema: Secondary | ICD-10-CM | POA: Diagnosis not present

## 2015-09-10 DIAGNOSIS — I7389 Other specified peripheral vascular diseases: Secondary | ICD-10-CM | POA: Diagnosis not present

## 2015-09-10 DIAGNOSIS — E1151 Type 2 diabetes mellitus with diabetic peripheral angiopathy without gangrene: Secondary | ICD-10-CM | POA: Diagnosis not present

## 2015-09-10 DIAGNOSIS — E784 Other hyperlipidemia: Secondary | ICD-10-CM | POA: Diagnosis not present

## 2015-09-10 DIAGNOSIS — Z0000 Encounter for general adult medical examination without abnormal findings: Secondary | ICD-10-CM | POA: Diagnosis not present

## 2015-09-10 DIAGNOSIS — G4733 Obstructive sleep apnea (adult) (pediatric): Secondary | ICD-10-CM | POA: Diagnosis not present

## 2015-09-10 DIAGNOSIS — H4089 Other specified glaucoma: Secondary | ICD-10-CM | POA: Diagnosis not present

## 2015-09-10 DIAGNOSIS — Z1389 Encounter for screening for other disorder: Secondary | ICD-10-CM | POA: Diagnosis not present

## 2015-09-10 DIAGNOSIS — I10 Essential (primary) hypertension: Secondary | ICD-10-CM | POA: Diagnosis not present

## 2015-12-17 DIAGNOSIS — G4733 Obstructive sleep apnea (adult) (pediatric): Secondary | ICD-10-CM | POA: Diagnosis not present

## 2015-12-17 DIAGNOSIS — E1151 Type 2 diabetes mellitus with diabetic peripheral angiopathy without gangrene: Secondary | ICD-10-CM | POA: Diagnosis not present

## 2015-12-17 DIAGNOSIS — Z6835 Body mass index (BMI) 35.0-35.9, adult: Secondary | ICD-10-CM | POA: Diagnosis not present

## 2015-12-17 DIAGNOSIS — I7389 Other specified peripheral vascular diseases: Secondary | ICD-10-CM | POA: Diagnosis not present

## 2015-12-17 DIAGNOSIS — I10 Essential (primary) hypertension: Secondary | ICD-10-CM | POA: Diagnosis not present

## 2015-12-17 DIAGNOSIS — H401122 Primary open-angle glaucoma, left eye, moderate stage: Secondary | ICD-10-CM | POA: Diagnosis not present

## 2015-12-17 DIAGNOSIS — H401111 Primary open-angle glaucoma, right eye, mild stage: Secondary | ICD-10-CM | POA: Diagnosis not present

## 2016-03-18 DIAGNOSIS — E784 Other hyperlipidemia: Secondary | ICD-10-CM | POA: Diagnosis not present

## 2016-03-18 DIAGNOSIS — I7389 Other specified peripheral vascular diseases: Secondary | ICD-10-CM | POA: Diagnosis not present

## 2016-03-18 DIAGNOSIS — G4733 Obstructive sleep apnea (adult) (pediatric): Secondary | ICD-10-CM | POA: Diagnosis not present

## 2016-03-18 DIAGNOSIS — Z6835 Body mass index (BMI) 35.0-35.9, adult: Secondary | ICD-10-CM | POA: Diagnosis not present

## 2016-03-18 DIAGNOSIS — E1151 Type 2 diabetes mellitus with diabetic peripheral angiopathy without gangrene: Secondary | ICD-10-CM | POA: Diagnosis not present

## 2016-03-18 DIAGNOSIS — I10 Essential (primary) hypertension: Secondary | ICD-10-CM | POA: Diagnosis not present

## 2016-04-30 DIAGNOSIS — Z23 Encounter for immunization: Secondary | ICD-10-CM | POA: Diagnosis not present

## 2016-06-17 DIAGNOSIS — E113291 Type 2 diabetes mellitus with mild nonproliferative diabetic retinopathy without macular edema, right eye: Secondary | ICD-10-CM | POA: Diagnosis not present

## 2016-06-17 DIAGNOSIS — E113292 Type 2 diabetes mellitus with mild nonproliferative diabetic retinopathy without macular edema, left eye: Secondary | ICD-10-CM | POA: Diagnosis not present

## 2016-06-17 DIAGNOSIS — H401111 Primary open-angle glaucoma, right eye, mild stage: Secondary | ICD-10-CM | POA: Diagnosis not present

## 2016-07-20 DIAGNOSIS — E1151 Type 2 diabetes mellitus with diabetic peripheral angiopathy without gangrene: Secondary | ICD-10-CM | POA: Diagnosis not present

## 2016-07-20 DIAGNOSIS — E784 Other hyperlipidemia: Secondary | ICD-10-CM | POA: Diagnosis not present

## 2016-07-20 DIAGNOSIS — I7389 Other specified peripheral vascular diseases: Secondary | ICD-10-CM | POA: Diagnosis not present

## 2016-07-20 DIAGNOSIS — I10 Essential (primary) hypertension: Secondary | ICD-10-CM | POA: Diagnosis not present

## 2016-07-20 DIAGNOSIS — Z6835 Body mass index (BMI) 35.0-35.9, adult: Secondary | ICD-10-CM | POA: Diagnosis not present

## 2016-07-20 DIAGNOSIS — Z1389 Encounter for screening for other disorder: Secondary | ICD-10-CM | POA: Diagnosis not present

## 2016-07-20 DIAGNOSIS — G4733 Obstructive sleep apnea (adult) (pediatric): Secondary | ICD-10-CM | POA: Diagnosis not present

## 2016-07-25 DIAGNOSIS — R0683 Snoring: Secondary | ICD-10-CM | POA: Diagnosis not present

## 2016-08-11 ENCOUNTER — Encounter: Payer: Self-pay | Admitting: Podiatry

## 2016-08-11 ENCOUNTER — Ambulatory Visit (INDEPENDENT_AMBULATORY_CARE_PROVIDER_SITE_OTHER): Payer: Medicare Other | Admitting: Podiatry

## 2016-08-11 DIAGNOSIS — B351 Tinea unguium: Secondary | ICD-10-CM | POA: Diagnosis not present

## 2016-08-11 DIAGNOSIS — M79675 Pain in left toe(s): Secondary | ICD-10-CM | POA: Diagnosis not present

## 2016-08-11 NOTE — Progress Notes (Signed)
Subjective:     Patient ID: Derrick Wilson, male   DOB: [birth year], 83 y.o.   MRN: [id number]  HPI patient presents stating the nail second left is loose and he is worried it's going to become infected   Review of Systems     Objective:   Physical Exam Neurovascular status intact with damaged second nail left that's loose and irritated    Assessment:     Damaged second nail left with trauma    Plan:     Carefully remove nail with no drainage underneath and advised on soaks and applied Band-Aid and reappoint as needed for condition

## 2016-12-16 DIAGNOSIS — H401111 Primary open-angle glaucoma, right eye, mild stage: Secondary | ICD-10-CM | POA: Diagnosis not present

## 2017-01-20 DIAGNOSIS — G4733 Obstructive sleep apnea (adult) (pediatric): Secondary | ICD-10-CM | POA: Diagnosis not present

## 2017-01-20 DIAGNOSIS — M25521 Pain in right elbow: Secondary | ICD-10-CM | POA: Diagnosis not present

## 2017-01-20 DIAGNOSIS — I10 Essential (primary) hypertension: Secondary | ICD-10-CM | POA: Diagnosis not present

## 2017-01-20 DIAGNOSIS — E1151 Type 2 diabetes mellitus with diabetic peripheral angiopathy without gangrene: Secondary | ICD-10-CM | POA: Diagnosis not present

## 2017-02-09 DIAGNOSIS — M7711 Lateral epicondylitis, right elbow: Secondary | ICD-10-CM | POA: Diagnosis not present

## 2017-02-10 DIAGNOSIS — I10 Essential (primary) hypertension: Secondary | ICD-10-CM | POA: Diagnosis not present

## 2017-05-02 DIAGNOSIS — G4733 Obstructive sleep apnea (adult) (pediatric): Secondary | ICD-10-CM | POA: Diagnosis not present

## 2017-05-02 DIAGNOSIS — R2681 Unsteadiness on feet: Secondary | ICD-10-CM | POA: Diagnosis not present

## 2017-05-02 DIAGNOSIS — E7849 Other hyperlipidemia: Secondary | ICD-10-CM | POA: Diagnosis not present

## 2017-05-02 DIAGNOSIS — E1151 Type 2 diabetes mellitus with diabetic peripheral angiopathy without gangrene: Secondary | ICD-10-CM | POA: Diagnosis not present

## 2017-05-09 DIAGNOSIS — R2681 Unsteadiness on feet: Secondary | ICD-10-CM | POA: Diagnosis not present

## 2017-05-18 DIAGNOSIS — R2681 Unsteadiness on feet: Secondary | ICD-10-CM | POA: Diagnosis not present

## 2017-05-23 DIAGNOSIS — R2681 Unsteadiness on feet: Secondary | ICD-10-CM | POA: Diagnosis not present

## 2017-05-31 DIAGNOSIS — R2681 Unsteadiness on feet: Secondary | ICD-10-CM | POA: Diagnosis not present

## 2017-06-07 DIAGNOSIS — R2681 Unsteadiness on feet: Secondary | ICD-10-CM | POA: Diagnosis not present

## 2017-06-15 DIAGNOSIS — R2681 Unsteadiness on feet: Secondary | ICD-10-CM | POA: Diagnosis not present

## 2017-06-21 DIAGNOSIS — R2681 Unsteadiness on feet: Secondary | ICD-10-CM | POA: Diagnosis not present

## 2017-06-23 DIAGNOSIS — E113293 Type 2 diabetes mellitus with mild nonproliferative diabetic retinopathy without macular edema, bilateral: Secondary | ICD-10-CM | POA: Diagnosis not present

## 2017-06-23 DIAGNOSIS — H401111 Primary open-angle glaucoma, right eye, mild stage: Secondary | ICD-10-CM | POA: Diagnosis not present

## 2017-06-28 DIAGNOSIS — R2681 Unsteadiness on feet: Secondary | ICD-10-CM | POA: Diagnosis not present

## 2017-07-06 DIAGNOSIS — R2681 Unsteadiness on feet: Secondary | ICD-10-CM | POA: Diagnosis not present

## 2017-07-19 DIAGNOSIS — R2681 Unsteadiness on feet: Secondary | ICD-10-CM | POA: Diagnosis not present

## 2017-07-27 DIAGNOSIS — R2681 Unsteadiness on feet: Secondary | ICD-10-CM | POA: Diagnosis not present

## 2017-08-03 DIAGNOSIS — R2681 Unsteadiness on feet: Secondary | ICD-10-CM | POA: Diagnosis not present

## 2017-08-10 DIAGNOSIS — R2681 Unsteadiness on feet: Secondary | ICD-10-CM | POA: Diagnosis not present

## 2017-08-15 DIAGNOSIS — G4733 Obstructive sleep apnea (adult) (pediatric): Secondary | ICD-10-CM | POA: Diagnosis not present

## 2017-08-15 DIAGNOSIS — R2681 Unsteadiness on feet: Secondary | ICD-10-CM | POA: Diagnosis not present

## 2017-08-15 DIAGNOSIS — I7389 Other specified peripheral vascular diseases: Secondary | ICD-10-CM | POA: Diagnosis not present

## 2017-08-15 DIAGNOSIS — E1151 Type 2 diabetes mellitus with diabetic peripheral angiopathy without gangrene: Secondary | ICD-10-CM | POA: Diagnosis not present

## 2017-08-17 DIAGNOSIS — R2681 Unsteadiness on feet: Secondary | ICD-10-CM | POA: Diagnosis not present

## 2017-08-24 DIAGNOSIS — R2681 Unsteadiness on feet: Secondary | ICD-10-CM | POA: Diagnosis not present

## 2017-08-31 DIAGNOSIS — R2681 Unsteadiness on feet: Secondary | ICD-10-CM | POA: Diagnosis not present

## 2017-11-14 DIAGNOSIS — Z125 Encounter for screening for malignant neoplasm of prostate: Secondary | ICD-10-CM | POA: Diagnosis not present

## 2017-11-14 DIAGNOSIS — E7849 Other hyperlipidemia: Secondary | ICD-10-CM | POA: Diagnosis not present

## 2017-11-14 DIAGNOSIS — I10 Essential (primary) hypertension: Secondary | ICD-10-CM | POA: Diagnosis not present

## 2017-11-14 DIAGNOSIS — R82998 Other abnormal findings in urine: Secondary | ICD-10-CM | POA: Diagnosis not present

## 2017-11-14 DIAGNOSIS — E1151 Type 2 diabetes mellitus with diabetic peripheral angiopathy without gangrene: Secondary | ICD-10-CM | POA: Diagnosis not present

## 2017-11-17 DIAGNOSIS — Z1212 Encounter for screening for malignant neoplasm of rectum: Secondary | ICD-10-CM | POA: Diagnosis not present

## 2017-11-21 DIAGNOSIS — R2681 Unsteadiness on feet: Secondary | ICD-10-CM | POA: Diagnosis not present

## 2017-11-21 DIAGNOSIS — Z794 Long term (current) use of insulin: Secondary | ICD-10-CM | POA: Diagnosis not present

## 2017-11-21 DIAGNOSIS — Z0000 Encounter for general adult medical examination without abnormal findings: Secondary | ICD-10-CM | POA: Diagnosis not present

## 2017-11-21 DIAGNOSIS — R152 Fecal urgency: Secondary | ICD-10-CM | POA: Diagnosis not present

## 2017-12-22 DIAGNOSIS — H401111 Primary open-angle glaucoma, right eye, mild stage: Secondary | ICD-10-CM | POA: Diagnosis not present

## 2018-03-06 DIAGNOSIS — E1151 Type 2 diabetes mellitus with diabetic peripheral angiopathy without gangrene: Secondary | ICD-10-CM | POA: Diagnosis not present

## 2018-03-06 DIAGNOSIS — I7389 Other specified peripheral vascular diseases: Secondary | ICD-10-CM | POA: Diagnosis not present

## 2018-03-06 DIAGNOSIS — Z794 Long term (current) use of insulin: Secondary | ICD-10-CM | POA: Diagnosis not present

## 2018-03-06 DIAGNOSIS — R2681 Unsteadiness on feet: Secondary | ICD-10-CM | POA: Diagnosis not present

## 2018-03-24 DIAGNOSIS — Z23 Encounter for immunization: Secondary | ICD-10-CM | POA: Diagnosis not present

## 2018-06-04 DIAGNOSIS — E1151 Type 2 diabetes mellitus with diabetic peripheral angiopathy without gangrene: Secondary | ICD-10-CM | POA: Diagnosis not present

## 2018-06-04 DIAGNOSIS — G4733 Obstructive sleep apnea (adult) (pediatric): Secondary | ICD-10-CM | POA: Diagnosis not present

## 2018-06-04 DIAGNOSIS — I7389 Other specified peripheral vascular diseases: Secondary | ICD-10-CM | POA: Diagnosis not present

## 2018-06-04 DIAGNOSIS — Z794 Long term (current) use of insulin: Secondary | ICD-10-CM | POA: Diagnosis not present

## 2018-06-21 DIAGNOSIS — E113291 Type 2 diabetes mellitus with mild nonproliferative diabetic retinopathy without macular edema, right eye: Secondary | ICD-10-CM | POA: Diagnosis not present

## 2018-06-21 DIAGNOSIS — H401111 Primary open-angle glaucoma, right eye, mild stage: Secondary | ICD-10-CM | POA: Diagnosis not present

## 2018-07-18 DIAGNOSIS — I639 Cerebral infarction, unspecified: Secondary | ICD-10-CM

## 2018-07-18 HISTORY — DX: Cerebral infarction, unspecified: I63.9

## 2018-07-29 ENCOUNTER — Observation Stay (HOSPITAL_COMMUNITY): Payer: Medicare Other

## 2018-07-29 ENCOUNTER — Emergency Department (HOSPITAL_COMMUNITY): Payer: Medicare Other

## 2018-07-29 ENCOUNTER — Other Ambulatory Visit: Payer: Self-pay

## 2018-07-29 ENCOUNTER — Encounter (HOSPITAL_COMMUNITY): Payer: Self-pay | Admitting: Emergency Medicine

## 2018-07-29 ENCOUNTER — Observation Stay (HOSPITAL_COMMUNITY)
Admission: EM | Admit: 2018-07-29 | Discharge: 2018-07-30 | Disposition: A | Discharge status: Home / Self Care | Payer: Medicare Other | Attending: Family Medicine | Admitting: Family Medicine

## 2018-07-29 DIAGNOSIS — Z7982 Long term (current) use of aspirin: Secondary | ICD-10-CM | POA: Diagnosis not present

## 2018-07-29 DIAGNOSIS — I4510 Unspecified right bundle-branch block: Secondary | ICD-10-CM | POA: Diagnosis not present

## 2018-07-29 DIAGNOSIS — E1165 Type 2 diabetes mellitus with hyperglycemia: Secondary | ICD-10-CM | POA: Diagnosis not present

## 2018-07-29 DIAGNOSIS — R0902 Hypoxemia: Secondary | ICD-10-CM | POA: Diagnosis not present

## 2018-07-29 DIAGNOSIS — Z79899 Other long term (current) drug therapy: Secondary | ICD-10-CM | POA: Diagnosis not present

## 2018-07-29 DIAGNOSIS — R000 Tachycardia, unspecified: Secondary | ICD-10-CM | POA: Diagnosis not present

## 2018-07-29 DIAGNOSIS — R531 Weakness: Secondary | ICD-10-CM | POA: Diagnosis not present

## 2018-07-29 DIAGNOSIS — Z794 Long term (current) use of insulin: Secondary | ICD-10-CM | POA: Diagnosis not present

## 2018-07-29 DIAGNOSIS — E119 Type 2 diabetes mellitus without complications: Secondary | ICD-10-CM | POA: Diagnosis present

## 2018-07-29 DIAGNOSIS — I10 Essential (primary) hypertension: Secondary | ICD-10-CM | POA: Diagnosis not present

## 2018-07-29 DIAGNOSIS — M6281 Muscle weakness (generalized): Secondary | ICD-10-CM | POA: Diagnosis not present

## 2018-07-29 DIAGNOSIS — G459 Transient cerebral ischemic attack, unspecified: Principal | ICD-10-CM | POA: Diagnosis present

## 2018-07-29 DIAGNOSIS — I499 Cardiac arrhythmia, unspecified: Secondary | ICD-10-CM | POA: Diagnosis not present

## 2018-07-29 DIAGNOSIS — I959 Hypotension, unspecified: Secondary | ICD-10-CM | POA: Diagnosis not present

## 2018-07-29 DIAGNOSIS — E1159 Type 2 diabetes mellitus with other circulatory complications: Secondary | ICD-10-CM | POA: Diagnosis not present

## 2018-07-29 DIAGNOSIS — R29818 Other symptoms and signs involving the nervous system: Secondary | ICD-10-CM | POA: Diagnosis not present

## 2018-07-29 DIAGNOSIS — I639 Cerebral infarction, unspecified: Secondary | ICD-10-CM | POA: Diagnosis not present

## 2018-07-29 LAB — DIFFERENTIAL
Abs Immature Granulocytes: 0.22 10*3/uL — ABNORMAL HIGH (ref 0.00–0.07)
Basophils Absolute: 0.1 10*3/uL (ref 0.0–0.1)
Basophils Relative: 0 %
EOS ABS: 0.1 10*3/uL (ref 0.0–0.5)
Eosinophils Relative: 1 %
Immature Granulocytes: 2 %
LYMPHS ABS: 1 10*3/uL (ref 0.7–4.0)
Lymphocytes Relative: 8 %
Monocytes Absolute: 0.6 10*3/uL (ref 0.1–1.0)
Monocytes Relative: 5 %
Neutro Abs: 9.6 10*3/uL — ABNORMAL HIGH (ref 1.7–7.7)
Neutrophils Relative %: 84 %

## 2018-07-29 LAB — COMPREHENSIVE METABOLIC PANEL
ALT: 21 U/L (ref 0–44)
AST: 30 U/L (ref 15–41)
Albumin: 3.7 g/dL (ref 3.5–5.0)
Alkaline Phosphatase: 81 U/L (ref 38–126)
Anion gap: 14 (ref 5–15)
BILIRUBIN TOTAL: 1.4 mg/dL — AB (ref 0.3–1.2)
BUN: 16 mg/dL (ref 8–23)
CO2: 19 mmol/L — ABNORMAL LOW (ref 22–32)
Calcium: 9.3 mg/dL (ref 8.9–10.3)
Chloride: 105 mmol/L (ref 98–111)
Creatinine, Ser: 1.23 mg/dL (ref 0.61–1.24)
GFR calc Af Amer: >60 mL/min (ref >60)
GFR calc non Af Amer: 54 mL/min — ABNORMAL LOW (ref >60)
Glucose, Bld: 269 mg/dL — ABNORMAL HIGH (ref 70–99)
POTASSIUM: 4.1 mmol/L (ref 3.5–5.1)
Sodium: 138 mmol/L (ref 135–145)
Total Protein: 6.1 g/dL — ABNORMAL LOW (ref 6.5–8.1)

## 2018-07-29 LAB — I-STAT CHEM 8, ED
BUN: 19 mg/dL (ref 8–23)
Calcium, Ion: 1.09 mmol/L — ABNORMAL LOW (ref 1.15–1.40)
Chloride: 105 mmol/L (ref 98–111)
Creatinine, Ser: 1.1 mg/dL (ref 0.61–1.24)
Glucose, Bld: 269 mg/dL — ABNORMAL HIGH (ref 70–99)
HCT: 49 % (ref 39.0–52.0)
Hemoglobin: 16.7 g/dL (ref 13.0–17.0)
Potassium: 4.1 mmol/L (ref 3.5–5.1)
Sodium: 139 mmol/L (ref 135–145)
TCO2: 19 mmol/L — ABNORMAL LOW (ref 22–32)

## 2018-07-29 LAB — PROTIME-INR
INR: 1.02
Prothrombin Time: 13.3 seconds (ref 11.4–15.2)

## 2018-07-29 LAB — LIPID PANEL
CHOLESTEROL: 97 mg/dL (ref 0–200)
HDL: 28 mg/dL — ABNORMAL LOW (ref >40)
LDL Cholesterol: 47 mg/dL (ref 0–99)
Total CHOL/HDL Ratio: 3.5 RATIO
Triglycerides: 111 mg/dL (ref <150)
VLDL: 22 mg/dL (ref 0–40)

## 2018-07-29 LAB — HEMOGLOBIN A1C
Hgb A1c MFr Bld: 8.5 % — ABNORMAL HIGH (ref 4.8–5.6)
Mean Plasma Glucose: 197.25 mg/dL

## 2018-07-29 LAB — APTT: aPTT: 24 seconds (ref 24–36)

## 2018-07-29 LAB — CBC
HCT: 50.9 % (ref 39.0–52.0)
Hemoglobin: 16.3 g/dL (ref 13.0–17.0)
MCH: 29.8 pg (ref 26.0–34.0)
MCHC: 32 g/dL (ref 30.0–36.0)
MCV: 93.1 fL (ref 80.0–100.0)
PLATELETS: 174 10*3/uL (ref 150–400)
RBC: 5.47 MIL/uL (ref 4.22–5.81)
RDW: 12.7 % (ref 11.5–15.5)
WBC: 11.5 10*3/uL — ABNORMAL HIGH (ref 4.0–10.5)
nRBC: 0 % (ref 0.0–0.2)

## 2018-07-29 LAB — ETHANOL: Alcohol, Ethyl (B): <10 mg/dL (ref <10)

## 2018-07-29 LAB — I-STAT TROPONIN, ED: Troponin i, poc: 0.01 ng/mL (ref 0.00–0.08)

## 2018-07-29 MED ORDER — LATANOPROST 0.005 % OP SOLN
1.0000 [drp] | Freq: Every day | OPHTHALMIC | Status: DC
  Filled 2018-07-29: qty 2.5

## 2018-07-29 MED ORDER — TIMOLOL MALEATE 0.25 % OP SOLN
1.0000 [drp] | Freq: Every day | OPHTHALMIC | Status: DC
  Administered 2018-07-30: 1 [drp] via OPHTHALMIC
  Filled 2018-07-29: qty 5

## 2018-07-29 MED ORDER — ACETAMINOPHEN 160 MG/5ML PO SOLN: 650.0000 mg | ORAL | Status: DC | PRN

## 2018-07-29 MED ORDER — ACETAMINOPHEN 325 MG PO TABS: 650.0000 mg | ORAL_TABLET | ORAL | Status: DC | PRN

## 2018-07-29 MED ORDER — SODIUM CHLORIDE 0.9 % IV SOLN
INTRAVENOUS | Status: DC
  Administered 2018-07-30: 1000 mL via INTRAVENOUS
  Administered 2018-07-30: 11:00:00 via INTRAVENOUS

## 2018-07-29 MED ORDER — ASPIRIN 325 MG PO TABS: 325.0000 mg | ORAL_TABLET | Freq: Every day | ORAL | Status: DC

## 2018-07-29 MED ORDER — IOPAMIDOL (ISOVUE-370) INJECTION 76%
INTRAVENOUS | Status: AC
  Filled 2018-07-29: qty 100

## 2018-07-29 MED ORDER — LACTATED RINGERS IV BOLUS
1000.0000 mL | Freq: Once | INTRAVENOUS | Status: AC
  Administered 2018-07-29: 1000 mL via INTRAVENOUS

## 2018-07-29 MED ORDER — BRIMONIDINE TARTRATE 0.15 % OP SOLN
1.0000 [drp] | Freq: Every day | OPHTHALMIC | Status: DC
  Filled 2018-07-29: qty 5

## 2018-07-29 MED ORDER — ACETAMINOPHEN 650 MG RE SUPP: 650.0000 mg | RECTAL | Status: DC | PRN

## 2018-07-29 MED ORDER — ATORVASTATIN CALCIUM 10 MG PO TABS
20.0000 mg | ORAL_TABLET | Freq: Every evening | ORAL | Status: DC
  Filled 2018-07-29: qty 2

## 2018-07-29 MED ORDER — LOSARTAN POTASSIUM 50 MG PO TABS: 100.0000 mg | ORAL_TABLET | Freq: Every day | ORAL | Status: DC

## 2018-07-29 MED ORDER — INSULIN ASPART 100 UNIT/ML ~~LOC~~ SOLN
0.0000 [IU] | Freq: Three times a day (TID) | SUBCUTANEOUS | Status: DC
  Administered 2018-07-30: 5 [IU] via SUBCUTANEOUS

## 2018-07-29 MED ORDER — ENOXAPARIN SODIUM 40 MG/0.4ML ~~LOC~~ SOLN
40.0000 mg | SUBCUTANEOUS | Status: DC
  Administered 2018-07-30: 40 mg via SUBCUTANEOUS
  Filled 2018-07-29: qty 0.4

## 2018-07-29 MED ORDER — IOPAMIDOL (ISOVUE-370) INJECTION 76%
75.0000 mL | Freq: Once | INTRAVENOUS | Status: AC | PRN
  Administered 2018-07-29: 75 mL via INTRAVENOUS

## 2018-07-29 MED ORDER — STROKE: EARLY STAGES OF RECOVERY BOOK
Freq: Once | Status: AC
  Administered 2018-07-30: 02:00:00
  Filled 2018-07-29: qty 1

## 2018-07-29 MED ORDER — ASPIRIN 300 MG RE SUPP: 300.0000 mg | Freq: Every day | RECTAL | Status: DC

## 2018-07-29 MED ORDER — INSULIN GLARGINE 100 UNIT/ML ~~LOC~~ SOLN
20.0000 [IU] | Freq: Every day | SUBCUTANEOUS | Status: DC
  Administered 2018-07-30: 20 [IU] via SUBCUTANEOUS
  Filled 2018-07-29: qty 0.2

## 2018-07-29 NOTE — ED Notes (Signed)
ED Provider at bedside. 

## 2018-07-29 NOTE — ED Notes (Signed)
Pt taken to MRI from CT, Per Desirae in MRI she will transport pt to 3W after MRI

## 2018-07-29 NOTE — ED Notes (Signed)
Attempted report to 3W unsuccessful

## 2018-07-29 NOTE — H&P (Signed)
History and Physical    Derrick Wilson BTD:[id number] DOB: 11/16/1933 DOA: 07/29/2018  PCP: Leanna Battles, MD  Patient coming from: Home.  Chief Complaint: Left lower extremity weakness.  HPI: Derrick Wilson is a 85 y.o. male with history of hypertension, hyperlipidemia, diabetes mellitus type 2 started developing sudden onset of left lower extremity weakness around 7 PM tonight.  Patient was not able to walk when this happened and was trying to move the trash can.  Neighbors came to help him and EMS was called.  Patient did not have any difficulty with speaking swallowing or any weakness of the other extremities or any visual symptoms.  ED Course: In the ER patient was mildly hypotensive and was given fluid bolus.  Patient states he has not eaten the whole day.  Blood sugars were high.  CT head followed by CT angiogram of the head and neck and MRI of the brain done were negative.  Initially when patient came he was weak on the left side and by the time he was getting a CT his symptoms completely resolved.  Neurology on-call was consulted and patient admitted for TIA work-up.  Review of Systems: As per HPI, rest all negative.   Past Medical History:  Diagnosis Date  . Diabetes mellitus   . Edema leg    legs   . Glaucoma   . Hyperlipidemia     Past Surgical History:  Procedure Laterality Date  . APPENDECTOMY    . CHOLECYSTECTOMY    . COLONOSCOPY    . TONSILLECTOMY AND ADENOIDECTOMY       reports that he has never smoked. He has never used smokeless tobacco. He reports that he does not drink alcohol or use drugs.  No Known Allergies  Family History  Problem Relation Age of Onset  . Diabetes Father     Prior to Admission medications   Medication Sig Start Date End Date Taking? Authorizing Provider  aspirin 81 MG tablet Take 81 mg by mouth every evening.    Yes [provider]  atorvastatin (LIPITOR) 20 MG tablet Take 20 mg by mouth every evening.   Yes  [provider]  brimonidine (ALPHAGAN P) 0.15 % ophthalmic solution Place 1 drop into both eyes at bedtime. Use one drop each eye bid    Yes [provider]  Canagliflozin (INVOKANA) 100 MG TABS Take 100 mg by mouth every morning.    Yes [provider]  glipiZIDE (GLUCOTROL XL) 5 MG 24 hr tablet Take 5 mg by mouth daily with breakfast.   Yes [provider]  insulin glargine (LANTUS) 100 UNIT/ML injection Inject 20 Units into the skin every morning.    Yes [provider]  latanoprost (XALATAN) 0.005 % ophthalmic solution Place 1 drop into both eyes at bedtime.   Yes [provider]  losartan (COZAAR) 100 MG tablet Take 100 mg by mouth every morning.    Yes [provider]  metFORMIN (GLUCOPHAGE) 1000 MG tablet Take 1,000 mg by mouth at bedtime.    Yes [provider]  Multiple Vitamin (MULTIVITAMIN) tablet Take 1 tablet by mouth every morning. Mature multi-vitamin and minerals   Yes [provider]  OMEGA-3 KRILL OIL PO Take 1 capsule by mouth every morning.   Yes [provider]  timolol (TIMOPTIC) 0.25 % ophthalmic solution Place 1 drop into both eyes every morning.    Yes [provider]  glucose blood test strip 1 each by Other route  as needed. Use as instructed    [provider]  [facility] DELICA LANCETS MISC by Does not apply route daily.    [provider]    Physical Exam: Vitals:   07/29/18 2100 07/29/18 2115 07/29/18 2130 07/29/18 2145  BP: (!) 100/58 112/70 103/62 125/61  Pulse: 90 87 92 95  Resp: (!) 22 17 14  (!) 24  Temp:      SpO2: 94% 94% 96% 95%  Weight:          Constitutional: Moderately built and nourished. Vitals:   07/29/18 2100 07/29/18 2115 07/29/18 2130 07/29/18 2145  BP: (!) 100/58 112/70 103/62 125/61  Pulse: 90 87 92 95  Resp: (!) 22 17 14  (!) 24  Temp:      SpO2: 94% 94% 96% 95%  Weight:       Eyes: Anicteric no pallor. ENMT: No  discharge from the ears eyes nose or mouth. Neck: No mass felt.  No neck rigidity.  No JVD appreciated. Respiratory: No rhonchi or crepitations. Cardiovascular: S1-S2 heard.  Abdomen: Soft nontender bowel sounds present. Musculoskeletal: No edema.  No joint effusion. Skin: No rash. Neurologic: Alert awake oriented to time place and person.  Moves all extremities 5 x 5.  No facial asymmetry tongue is midline. Psychiatric: Appears normal.  Normal affect.   Labs on Admission: I have personally reviewed following labs and imaging studies  CBC: Recent Labs  Lab 07/29/18 2020 07/29/18 2032  WBC 11.5*  --   NEUTROABS 9.6*  --   HGB 16.3 16.7  HCT 50.9 49.0  MCV 93.1  --   PLT 174  --    Basic Metabolic Panel: Recent Labs  Lab 07/29/18 2020 07/29/18 2032  NA 138 139  K 4.1 4.1  CL 105 105  CO2 19*  --   GLUCOSE 269* 269*  BUN 16 19  CREATININE 1.23 1.10  CALCIUM 9.3  --    GFR: CrCl cannot be calculated (Unknown ideal weight.). Liver Function Tests: Recent Labs  Lab 07/29/18 2020  AST 30  ALT 21  ALKPHOS 81  BILITOT 1.4*  PROT 6.1*  ALBUMIN 3.7   No results for input(s): LIPASE, AMYLASE in the last 168 hours. No results for input(s): AMMONIA in the last 168 hours. Coagulation Profile: Recent Labs  Lab 07/29/18 2020  INR 1.02   Cardiac Enzymes: No results for input(s): CKTOTAL, CKMB, CKMBINDEX, TROPONINI in the last 168 hours. BNP (last 3 results) No results for input(s): PROBNP in the last 8760 hours. HbA1C: Recent Labs    07/29/18 2020  HGBA1C 8.5*   CBG: No results for input(s): GLUCAP in the last 168 hours. Lipid Profile: Recent Labs    07/29/18 2112  CHOL 97  HDL 28*  LDLCALC 47  TRIG 111  CHOLHDL 3.5   Thyroid Function Tests: No results for input(s): TSH, T4TOTAL, FREET4, T3FREE, THYROIDAB in the last 72 hours. Anemia Panel: No results for input(s): VITAMINB12, FOLATE, FERRITIN, TIBC, IRON, RETICCTPCT in the last 72 hours. Urine  analysis: No results found for: COLORURINE, APPEARANCEUR, LABSPEC, PHURINE, GLUCOSEU, HGBUR, BILIRUBINUR, KETONESUR, PROTEINUR, UROBILINOGEN, NITRITE, LEUKOCYTESUR Sepsis Labs: @LABRCNTIP (procalcitonin:4,lacticidven:4) )No results found for this or any previous visit (from the past 240 hour(s)).   Radiological Exams on Admission: Dg Chest 2 View  Result Date: 07/29/2018 CLINICAL DATA:  Hypoxia. EXAM: CHEST - 2 VIEW COMPARISON:  05/14/2008. FINDINGS: AP and lateral views of the chest were obtained. Low volume film with stable asymmetric elevation right hemidiaphragm. Cardiopericardial silhouette  is at upper limits of normal for size. Interstitial markings are diffusely coarsened with chronic features. Stable chronic atelectasis or scarring at the right base. The visualized bony structures of the thorax are intact. Telemetry leads overlie the chest. IMPRESSION: Stable. No acute cardiopulmonary findings. Electronically Signed   By: Misty Stanley M.D.   On: 07/29/2018 22:05   Ct Head Code Stroke Wo Contrast  Result Date: 07/29/2018 CLINICAL DATA:  Code stroke. Initial evaluation for acute left-sided weakness. EXAM: CT HEAD WITHOUT CONTRAST TECHNIQUE: Contiguous axial images were obtained from the base of the skull through the vertex without intravenous contrast. COMPARISON:  None. FINDINGS: Brain: Generalized age-related cerebral atrophy with mild chronic small vessel ischemic disease. No acute intracranial hemorrhage. No acute large vessel territory infarct. No mass lesion, midline shift or mass effect. No hydrocephalus. No extra-axial fluid collection. Vascular: No hyperdense vessel. Calcified atherosclerosis at the skull base. Skull: Scalp soft tissues and calvarium within normal limits. Sinuses/Orbits: Globes and orbital soft tissues within normal limits. Scattered mucosal thickening within the paranasal sinuses. No mastoid effusion. Other: None. ASPECTS [facility] Stroke Program Early CT Score) -  Ganglionic level infarction (caudate, lentiform nuclei, internal capsule, insula, M1-M3 cortex): 7 - Supraganglionic infarction (M4-M6 cortex): 3 Total score (0-10 with 10 being normal): 10 IMPRESSION: 1. No acute intracranial infarct or other abnormality identified. 2. ASPECTS is 10. 3. Age-related cerebral atrophy with mild chronic small vessel ischemic disease. These results were communicated to Dr. Leonel Ramsay at 8:58 pmon 1/12/2020by text page via the [facility] messaging system. Electronically Signed   By: Jeannine Boga M.D.   On: 07/29/2018 21:01    EKG: Independently reviewed.  Normal sinus rhythm EKG is not a good quality repeat EKG has been ordered to show sinus rhythm.  Assessment/Plan Principal Problem:   TIA (transient ischemic attack) Active Problems:   Type 2 diabetes mellitus with vascular disease ([place])   Essential hypertension    1. TIA -appreciate neurology consult patient is on aspirin and statins.  Neurochecks.  2D echo is pending.  Continue to monitor in telemetry.  Repeat EKG ordered.  Physical therapy consult.  Patient passed swallow evaluation.  Hemoglobin A1c is 8.5 LDL is within acceptable limits. 2. Diabetes mellitus type 2 uncontrolled -advised about more stricter control of diabetes.  For now patient will continue on Lantus and sliding scale coverage.  Follow CBGs closely. 3. Hypertension with initial hypotensive episode in the ER was given fluid bolus.  Will hold Cozaar for now and closely monitor blood pressure trends.  PRN IV hydralazine. 4. Hyperlipidemia on statins.   DVT prophylaxis: Lovenox. Code Status: Full code. Family Communication: Patient's wife and son. Disposition Plan: Home. Consults called: Neurology. Admission status: Observation.   Rise Patience MD [facility] Pager [phone].  If 7PM-7AM, please contact night-coverage [email or web address] Password [facility]  07/29/2018, 10:28 PM

## 2018-07-29 NOTE — ED Triage Notes (Signed)
Pt arrived [facility] from home with reports of L leg weakness, per EMS pt was walking to take his trash out and had a sudden onset of weakness to the left leg and has since not been able to bear weight.  EMS reports O2 saturation of 88% on RA, and 94% on 2L [place].  Vitals with EMS BP 116/67 P80 SR CBG 289

## 2018-07-29 NOTE — Consult Note (Signed)
Neurology Consultation Reason for Consult: Leg weakness Referring Physician: Aileen Pilot  CC: Leg weakness  History is obtained from: Patient  HPI: Derrick Wilson is a 85 y.o. male with a history of diabetes, hyperlipidemia who presents with leg weakness that started abruptly around 7 PM.  He states that he was walking down the street pushing his trash can out, he walked down fine, but has he was walking back he noticed that his left leg was giving out.  He denies any pain in the leg or his back.  He states that he just did not have the strength to hold him.  He initially leaned against the trash can, but then sat down.  His neighbor and his neighbor son came over to try and help him to stand up, but even with the 2 of them, they were unable to help him stand.  Therefore, EMS was called and he was transported via EMS.  A code stroke was not initially activated until he was evaluated by the ED staff.  They found him to have significant left leg weakness with pretty severe drift.  Code stroke was activated and he was taken to CT where I first saw the patient.  By the time I evaluated the patient, his left leg weakness had mostly resolved.  Of note, he has a chronic left toe weakness and difficulty with dorsiflexion of the left foot.   LKW: 7 PM tpa given?: no, resolution of symptoms   ROS: A 14 point ROS was performed and is negative except as noted in the HPI.    Past Medical History:  Diagnosis Date  . Diabetes mellitus   . Edema leg    legs   . Glaucoma   . Hyperlipidemia      Family History  Problem Relation Age of Onset  . Diabetes Father      Social History:  reports that he has never smoked. He has never used smokeless tobacco. He reports that he does not drink alcohol or use drugs.    Exam: Current vital signs: BP 121/62   Pulse 86   Temp 99.2 F (37.3 C)   Resp 20   Wt 100.8 kg   SpO2 98%   BMI 33.30 kg/m  Vital signs in last 24 hours: Temp:  [99.2 F  (37.3 C)] 99.2 F (37.3 C) (01/12 2018) Pulse Rate:  [86-101] 86 (01/12 2230) Resp:  [13-31] 20 (01/12 2230) BP: (89-125)/(47-79) 121/62 (01/12 2230) SpO2:  [88 %-98 %] 98 % (01/12 2230) Weight:  [100.8 kg] 100.8 kg (01/12 2018)   Physical Exam  Constitutional: Appears well-developed and well-nourished.  Psych: Affect appropriate to situation Eyes: No scleral injection HENT: No OP obstrucion Head: Normocephalic.  Cardiovascular: Normal rate and regular rhythm.  Respiratory: Effort normal, non-labored breathing GI: Soft.  No distension. There is no tenderness.  Skin: WDI  Neuro: Mental Status: Patient is awake, alert, oriented to person, place, month, year, and situation. Patient is able to give a clear and coherent history. No signs of aphasia or neglect Cranial Nerves: II: Visual Fields are full. Pupils are equal, round, and reactive to light.   III,IV, VI: EOMI without ptosis or diploplia.  V: Facial sensation is symmetric to temperature VII: Facial movement is symmetric.  VIII: hearing is intact to voice X: Uvula elevates symmetrically XI: Shoulder shrug is symmetric. XII: tongue is midline without atrophy or fasciculations.  Motor: Tone is normal. Bulk is normal. 5/5 strength was present in bilateral arms  and right leg, he is able to hold the left leg without drift, with 5 out of 5 knee extension and knee flexion, but he does have 0/5 ankle dorsiflexion and 0/5 left toe extension Sensory: Sensation is symmetric to light touch and temperature in the arms and legs.  There is no clear peroneal distribution or radicular distribution numbness in the left leg Cerebellar: Finger-nose-finger with mild intentional tremor bilaterally  I have reviewed labs in epic and the results pertinent to this consultation are: Chem-8-elevated glucose  I have reviewed the images obtained: CT head-unremarkable  Impression: 85 year old male with transient painless left leg weakness.  The  sudden resolution would argue against spinal pathology, and the lack of pain would argue against peripheral vascular disease.  I suspect at this point that the most likely etiology of transient severe left leg weakness would be transient ischemic attack.  Would favor admission for further evaluation and secondary risk factor modification.  Recommendations: - HgbA1c, fasting lipid panel - MRI  of the brain without contrast - Frequent neuro checks - Echocardiogram -CT angiogram head neck - Prophylactic therapy-Antiplatelet med: Aspirin - dose 325mg  PO or 300mg  PR - Risk factor modification - Telemetry monitoring - PT consult, OT consult, Speech consult - Stroke team to follow    Roland Rack, MD [facility] [phone]  If 7pm- 7am, please page neurology on call as listed in [place].

## 2018-07-29 NOTE — ED Notes (Signed)
Patient transported to CT 

## 2018-07-29 NOTE — ED Provider Notes (Addendum)
[place] EMERGENCY DEPARTMENT Provider Note   CSN: [id number] Arrival date & time: 07/29/18  2007   History   Chief Complaint Chief Complaint  Patient presents with  . Extremity Weakness    HPI Derrick Wilson is a 85 y.o. male.  HPI  Patient is a 85 year old male with a past medical history of DM, HTN, and HDL without any known cardiac or neurological history who presents via EMS from home for evaluation of acute onset of left lower extremity weakness that began at approximately 7 PM today.  Patient states he was walking in his driveway taking his trash out when he fell onto both knees because his left lower extremity collapsed underneath him.  He denies any headache, vertigo, vision changes, chest pain, cough, shortness of breath, nausea, vomiting, diarrhea, dysuria, recent fevers or chills, rash, or weakness numbness or tingling in any other extremity.  He denies prior similar episodes.  Denies alleviating or aggravating factors.  Denies any recent falls or traumatic injuries.  Denies taking any blood thinners.  Denies any history of malignancy, DVT/PE, tobacco abuse, CAD history, or prior CVA history.  Past Medical History:  Diagnosis Date  . Diabetes mellitus   . Edema leg    legs   . Glaucoma   . Hyperlipidemia     Patient Active Problem List   Diagnosis Date Noted  . DIABETES MELLITUS, TYPE II 12/06/2006  . GLAUCOMA NOS 12/06/2006  . BENIGN PROSTATIC HYPERTROPHY 12/06/2006    Past Surgical History:  Procedure Laterality Date  . APPENDECTOMY    . CHOLECYSTECTOMY    . COLONOSCOPY    . TONSILLECTOMY AND ADENOIDECTOMY          Home Medications    Prior to Admission medications   Medication Sig Start Date End Date Taking? Authorizing Provider  aspirin 162 MG EC tablet Take 162 mg by mouth daily.    [provider]  Brimonidine Tartrate (ALPHAGAN P OP) Apply 0.1 % to eye. Use one drop each eye bid    [provider]    Canagliflozin (INVOKANA) 100 MG TABS Take by mouth.    [provider]  desoximetasone (TOPICORT) 0.25 % cream Apply 1 application topically 2 (two) times daily.    [provider]  glipiZIDE (GLUCOTROL XL) 5 MG 24 hr tablet Take 5 mg by mouth daily with breakfast.    [provider]  glucose blood test strip 1 each by Other route as needed. Use as instructed    [provider]  glyBURIDE (DIABETA) 5 MG tablet Take 5 mg by mouth 2 (two) times daily.    [provider]  Insulin Glargine (LANTUS [place]) Inject 30 Units into the skin daily.     [provider]  Javier Docker Oil 300 MG CAPS Take by mouth every evening.    [provider]  latanoprost (XALATAN) 0.005 % ophthalmic solution Place 1 drop into both eyes at bedtime.    [provider]  levofloxacin (LEVAQUIN) 500 MG tablet Take 1 tablet (500 mg total) by mouth daily. 05/29/14   Wallene Huh, DPM  lisinopril (PRINIVIL,ZESTRIL) 10 MG tablet Take 10 mg by mouth daily.    [provider]  losartan (COZAAR) 100 MG tablet Take 100 mg by mouth daily.    [provider]  metFORMIN (GLUCOPHAGE) 1000 MG tablet Take 1,000 mg by mouth 2 (two) times daily with a meal.    [provider]  metipranolol (OPTIPRANOLOL) 0.3 %  ophthalmic solution Place 1 drop into both eyes 2 (two) times daily.    [provider]  Multiple Vitamin (MULTIVITAMIN) tablet Take by mouth. Mature multi-vitamin and minerals-Take one daily    [provider]  niacin (NIASPAN) 500 MG CR tablet Take 500 mg by mouth at bedtime.    [provider]  [facility] DELICA LANCETS MISC by Does not apply route daily.    [provider]  timolol (TIMOPTIC) 0.25 % ophthalmic solution 1 drop 2 (two) times daily.    [provider]    Family History Family History  Problem Relation Age of Onset  . Diabetes Father     Social History Social History   Tobacco  Use  . Smoking status: Never Smoker  . Smokeless tobacco: Never Used  Substance Use Topics  . Alcohol use: No  . Drug use: No     Allergies   Patient has no known allergies.   Review of Systems Review of Systems  Constitutional: Negative for chills and fever.  HENT: Negative for ear pain and sore throat.   Eyes: Negative for pain and visual disturbance.  Respiratory: Negative for cough and shortness of breath.   Cardiovascular: Negative for chest pain and palpitations.  Gastrointestinal: Negative for abdominal pain and vomiting.  Genitourinary: Negative for dysuria and hematuria.  Musculoskeletal: Negative for arthralgias and back pain.  Skin: Positive for wound (" I scrapped both knees"). Negative for color change and rash.  Neurological: Positive for weakness ( LLE). Negative for seizures and syncope.  All other systems reviewed and are negative.    Physical Exam Updated Vital Signs BP 95/60   Pulse 91   Temp 99.2 F (37.3 C)   Resp (!) 21   Wt 100.8 kg   SpO2 95%   BMI 33.30 kg/m   Physical Exam Vitals signs and nursing note reviewed.  Constitutional:      Appearance: Normal appearance. He is well-developed and normal weight.  HENT:     Head: Normocephalic and atraumatic.     Right Ear: External ear normal.     Left Ear: External ear normal.     Nose: Nose normal.     Mouth/Throat:     Mouth: Mucous membranes are moist.  Eyes:     Extraocular Movements: Extraocular movements intact.     Conjunctiva/sclera: Conjunctivae normal.     Pupils: Pupils are equal, round, and reactive to light.  Neck:     Musculoskeletal: Neck supple. No neck rigidity.  Cardiovascular:     Rate and Rhythm: Normal rate and regular rhythm.     Heart sounds: No murmur.  Pulmonary:     Effort: Pulmonary effort is normal. No respiratory distress.     Breath sounds: Normal breath sounds.  Abdominal:     Palpations: Abdomen is soft.     Tenderness: There is no abdominal  tenderness.  Musculoskeletal:     Right lower leg: No edema.     Left lower leg: No edema.  Skin:    General: Skin is warm and dry.  Neurological:     Mental Status: He is alert and oriented to person, place, and time.     Cranial Nerves: No cranial nerve deficit.     Sensory: No sensory deficit.     Motor: Weakness present.    Patient has full strength throughout both upper extremities and right lower extremity.  He has 3/5 strength with left hip flexion extension as well as dorsiflexion of  the left foot.  Patient is able to flex and extend his left knee against resistance with full/strength as well as plantar flex his left foot with full strength.  Sensation is intact light touch throughout the left lower extremity.  ED Treatments / Results  Labs (all labs ordered are listed, but only abnormal results are displayed) Labs Reviewed  CBC - Abnormal; Notable for the following components:      Result Value   WBC 11.5 (*)    All other components within normal limits  DIFFERENTIAL - Abnormal; Notable for the following components:   Neutro Abs 9.6 (*)    Abs Immature Granulocytes 0.22 (*)    All other components within normal limits  COMPREHENSIVE METABOLIC PANEL - Abnormal; Notable for the following components:   CO2 19 (*)    Glucose, Bld 269 (*)    Total Protein 6.1 (*)    Total Bilirubin 1.4 (*)    GFR calc non Af Amer 54 (*)    All other components within normal limits  I-STAT CHEM 8, ED - Abnormal; Notable for the following components:   Glucose, Bld 269 (*)    Calcium, Ion 1.09 (*)    TCO2 19 (*)    All other components within normal limits  ETHANOL  PROTIME-INR  APTT  RAPID URINE DRUG SCREEN, HOSP PERFORMED  URINALYSIS, ROUTINE W REFLEX MICROSCOPIC  LIPID PANEL  HEMOGLOBIN A1C  I-STAT TROPONIN, ED    EKG EKG Interpretation  Date/Time:  Sunday July 29 2018 20:18:23 EST Ventricular Rate:  96 PR Interval:    QRS Duration: 134 QT Interval:  372 QTC  Calculation: 471 R Axis:   -39 Text Interpretation:  Sinus tachycardia Atrial premature complexes in couplets Right bundle branch block Inferior infarct, age indeterminate new since last ekg in 2006 Confirmed by Isla Pence [phone]) on 07/29/2018 8:23:09 PM   Radiology Ct Head Code Stroke Wo Contrast  Result Date: 07/29/2018 CLINICAL DATA:  Code stroke. Initial evaluation for acute left-sided weakness. EXAM: CT HEAD WITHOUT CONTRAST TECHNIQUE: Contiguous axial images were obtained from the base of the skull through the vertex without intravenous contrast. COMPARISON:  None. FINDINGS: Brain: Generalized age-related cerebral atrophy with mild chronic small vessel ischemic disease. No acute intracranial hemorrhage. No acute large vessel territory infarct. No mass lesion, midline shift or mass effect. No hydrocephalus. No extra-axial fluid collection. Vascular: No hyperdense vessel. Calcified atherosclerosis at the skull base. Skull: Scalp soft tissues and calvarium within normal limits. Sinuses/Orbits: Globes and orbital soft tissues within normal limits. Scattered mucosal thickening within the paranasal sinuses. No mastoid effusion. Other: None. ASPECTS [facility] Stroke Program Early CT Score) - Ganglionic level infarction (caudate, lentiform nuclei, internal capsule, insula, M1-M3 cortex): 7 - Supraganglionic infarction (M4-M6 cortex): 3 Total score (0-10 with 10 being normal): 10 IMPRESSION: 1. No acute intracranial infarct or other abnormality identified. 2. ASPECTS is 10. 3. Age-related cerebral atrophy with mild chronic small vessel ischemic disease. These results were communicated to Dr. Leonel Ramsay at 8:58 pmon 1/12/2020by text page via the [facility] messaging system. Electronically Signed   By: Jeannine Boga M.D.   On: 07/29/2018 21:01    Procedures Procedures (including critical care time)  Medications Ordered in ED Medications  lactated ringers bolus 1,000 mL (has no administration in  time range)  iopamidol (ISOVUE-370) 76 % injection (has no administration in time range)     Initial Impression / Assessment and Plan / ED Course  I have reviewed the triage vital signs and  the nursing notes.  Pertinent labs & imaging results that were available during my care of the patient were reviewed by me and considered in my medical decision making (see chart for details).     Patient is an 85 year old male who presents with above-stated history exam.  On presentation patient is noted to be afebrile with SPO2 in the high 80s on room air with otherwise stable vital signs.  Nasal cannula was immediately placed with SPO2 increased to the low 90s.  Given above neurological deficits with history of acute onset approximately 1.5 hours prior to arrival patient was made code stroke on arrival.  ECG shows sinus rhythm with a right bundle branch block, normal intervals, ST depressions in leads I and aVL with elevations in 3.  QRS is widened leads V2 with flipped T waves in V1, V2, V3 and ST depressions in V6.  Troponin 0 0.01.  CBC shows a WBC of 11.5, hemoglobin 16.3, platelets 174.  Ethanol undetectable.  Please see consultants notes for further details regarding evaluation management recommendations.  In brief on reassessment by neurology patient symptoms have largely resolved and it was determined that his symptoms were most likely from a TIA.  Recommendations include admission for CTA head and neck, MRI brain, TTE, and re-stratification studies.  These orders were placed.  CT Head 1. No acute intracranial infarct or other abnormality identified. 2. ASPECTS is 10. 3. Age-related cerebral atrophy with mild chronic small vessel ischemic disease.  Patient admitted to hospital service in stable condition for further evaluation management.  Final Clinical Impressions(s) / ED Diagnoses   Final diagnoses:  Weakness    ED Discharge Orders    None       Hulan Saas, MD 07/29/18  2108    Hulan Saas, MD 07/29/18 2140    Isla Pence, MD 07/29/18 2236

## 2018-07-30 ENCOUNTER — Observation Stay (HOSPITAL_BASED_OUTPATIENT_CLINIC_OR_DEPARTMENT_OTHER): Payer: Medicare Other

## 2018-07-30 ENCOUNTER — Encounter (HOSPITAL_COMMUNITY): Payer: Self-pay | Admitting: *Deleted

## 2018-07-30 DIAGNOSIS — E1159 Type 2 diabetes mellitus with other circulatory complications: Secondary | ICD-10-CM | POA: Diagnosis not present

## 2018-07-30 DIAGNOSIS — G459 Transient cerebral ischemic attack, unspecified: Secondary | ICD-10-CM | POA: Diagnosis not present

## 2018-07-30 DIAGNOSIS — I10 Essential (primary) hypertension: Secondary | ICD-10-CM | POA: Diagnosis not present

## 2018-07-30 LAB — URINALYSIS, ROUTINE W REFLEX MICROSCOPIC
Bacteria, UA: NONE SEEN
Bilirubin Urine: NEGATIVE
Glucose, UA: >=500 mg/dL — AB
Hgb urine dipstick: NEGATIVE
Ketones, ur: 5 mg/dL — AB
Leukocytes, UA: NEGATIVE
Nitrite: NEGATIVE
Protein, ur: NEGATIVE mg/dL
SPECIFIC GRAVITY, URINE: 1.033 — AB (ref 1.005–1.030)
pH: 5 (ref 5.0–8.0)

## 2018-07-30 LAB — ECHOCARDIOGRAM COMPLETE
Height: 69 in
Weight: 3530.89 oz

## 2018-07-30 LAB — RAPID URINE DRUG SCREEN, HOSP PERFORMED
AMPHETAMINES: NOT DETECTED
BARBITURATES: NOT DETECTED
Benzodiazepines: NOT DETECTED
COCAINE: NOT DETECTED
Opiates: NOT DETECTED
Tetrahydrocannabinol: NOT DETECTED

## 2018-07-30 LAB — GLUCOSE, CAPILLARY
GLUCOSE-CAPILLARY: 148 mg/dL — AB (ref 70–99)
Glucose-Capillary: 279 mg/dL — ABNORMAL HIGH (ref 70–99)

## 2018-07-30 MED ORDER — ASPIRIN 81 MG PO TABS: 81.0000 mg | ORAL_TABLET | Freq: Every evening | ORAL | 0 refills | Status: AC

## 2018-07-30 MED ORDER — ASPIRIN EC 81 MG PO TBEC
81.0000 mg | DELAYED_RELEASE_TABLET | Freq: Every day | ORAL | Status: DC
  Administered 2018-07-30: 81 mg via ORAL
  Filled 2018-07-30: qty 1

## 2018-07-30 MED ORDER — HYDRALAZINE HCL 20 MG/ML IJ SOLN: 10.0000 mg | INTRAMUSCULAR | Status: DC | PRN

## 2018-07-30 MED ORDER — CLOPIDOGREL BISULFATE 75 MG PO TABS: 75.0000 mg | ORAL_TABLET | Freq: Every day | ORAL | 0 refills | Status: AC

## 2018-07-30 MED ORDER — CLOPIDOGREL BISULFATE 75 MG PO TABS
75.0000 mg | ORAL_TABLET | Freq: Every day | ORAL | Status: DC
  Administered 2018-07-30: 75 mg via ORAL
  Filled 2018-07-30: qty 1

## 2018-07-30 NOTE — Care Management Note (Signed)
Case Management Note  Patient Details  Name: KYRESE GARTMAN MRN: [id number] Date of Birth: 09/23/1933  Subjective/Objective:   Pt in with TIA. He is from home with his spouse.   DME: none No issue with obtaining home meds.  No issues with transportation.              Action/Plan: Recommendations are for [facility] services. CM provided choice and the selected [facility]. Dorian Pod with [facility] notified and accepted the referral.  Pt with orders for walker and 3 in 1. Will notify Jeneen Rinks with [facility] D[facility] and he will deliver to the room. Family to provide transportation home.    Expected Discharge Date:  07/31/18               Expected Discharge Plan:  [place]  In-House Referral:     Discharge planning Services  CM Consult  Post Acute Care Choice:  Home Health, Durable Medical Equipment Choice offered to:  Patient, Spouse, Adult Children  DME Arranged:  Walker rolling DME Agency:  [place] Arranged:  PT, OT, Nurse's Aide [place] Agency:  [facility]  Status of Service:  In process, will continue to follow  If discussed at Long Length of Stay Meetings, dates discussed:    Additional Comments:  Pollie Friar, RN 07/30/2018, 2:19 PM

## 2018-07-30 NOTE — Progress Notes (Addendum)
STROKE TEAM PROGRESS NOTE   INTERVAL HISTORY His PT is at the bedside working with him. He feels back to his baseline. Has had 2D. Hoping for d/c today.  Son and family arrived during rounds. Complains of R knee pain. Has B knee strawberries.  Vitals:   07/30/18 0155 07/30/18 0355 07/30/18 0554 07/30/18 1217  BP: 124/68 (!) 149/72 133/68 125/69  Pulse: 85 85 88 75  Resp: 20 20 20    Temp: 98.7 F (37.1 C) 98.7 F (37.1 C) 98.7 F (37.1 C) 98.2 F (36.8 C)  TempSrc: Oral Oral Oral Oral  SpO2: 94% 95% 94% 95%  Weight:      Height:        CBC:  Recent Labs  Lab 07/29/18 2020 07/29/18 2032  WBC 11.5*  --   NEUTROABS 9.6*  --   HGB 16.3 16.7  HCT 50.9 49.0  MCV 93.1  --   PLT 174  --     Basic Metabolic Panel:  Recent Labs  Lab 07/29/18 2020 07/29/18 2032  NA 138 139  K 4.1 4.1  CL 105 105  CO2 19*  --   GLUCOSE 269* 269*  BUN 16 19  CREATININE 1.23 1.10  CALCIUM 9.3  --    Lipid Panel:     Component Value Date/Time   CHOL 97 07/29/2018 2112   TRIG 111 07/29/2018 2112   HDL 28 (L) 07/29/2018 2112   CHOLHDL 3.5 07/29/2018 2112   VLDL 22 07/29/2018 2112   LDLCALC 47 07/29/2018 2112   HgbA1c:  Lab Results  Component Value Date   HGBA1C 8.5 (H) 07/29/2018   Urine Drug Screen: No results found for: LABOPIA, COCAINSCRNUR, LABBENZ, AMPHETMU, THCU, LABBARB  Alcohol Level     Component Value Date/Time   ETH <10 07/29/2018 2020    IMAGING Ct Angio Head W Or Wo Contrast  Result Date: 07/29/2018 CLINICAL DATA:  85 y/o  M; left leg weakness.  Stroke patient. EXAM: CT ANGIOGRAPHY HEAD AND NECK TECHNIQUE: Multidetector CT imaging of the head and neck was performed using the standard protocol during bolus administration of intravenous contrast. Multiplanar CT image reconstructions and MIPs were obtained to evaluate the vascular anatomy. Carotid stenosis measurements (when applicable) are obtained utilizing NASCET criteria, using the distal internal carotid diameter  as the denominator. CONTRAST:  12mL ISOVUE-370 IOPAMIDOL (ISOVUE-370) INJECTION 76% COMPARISON:  07/29/2017 CT head. FINDINGS: CTA NECK FINDINGS Aortic arch: Standard branching. Imaged portion shows no evidence of aneurysm or dissection. No significant stenosis of the major arch vessel origins. Mild calcific atherosclerosis. Right carotid system: No evidence of dissection, stenosis (50% or greater) or occlusion. Mild non stenotic calcific atherosclerosis of the carotid bifurcation. Left carotid system: No evidence of dissection, stenosis (50% or greater) or occlusion. Mild non stenotic calcific atherosclerosis of the carotid bifurcation. Vertebral arteries: Codominant. No evidence of dissection, stenosis (50% or greater) or occlusion. Skeleton: Advanced spondylosis of the cervical spine. C3-C five vertebral body and facet fusion. Multilevel spinal canal stenosis greatest at the C5-6 level where it is moderate to severe. Other neck: Negative. Upper chest: Negative. Review of the MIP images confirms the above findings CTA HEAD FINDINGS Anterior circulation: No significant stenosis, proximal occlusion, aneurysm, or vascular malformation. Calcific atherosclerosis of carotid siphons with segments of mild less than 50% stenosis. Posterior circulation: No significant stenosis, proximal occlusion, aneurysm, or vascular malformation. Calcific atherosclerosis of left-greater-than-right vertebral arteries with mild less than 50% stenosis. Venous sinuses: As permitted by contrast timing, patent. Anatomic  variants: None significant. Delayed phase: No abnormal intracranial enhancement. Review of the MIP images confirms the above findings IMPRESSION: 1. Patent carotid and vertebral arteries. No dissection, aneurysm, or hemodynamically significant stenosis utilizing NASCET criteria. 2. Patent anterior and posterior intracranial circulation. No large vessel occlusion, aneurysm, or significant stenosis. These results were called by  telephone at the time of interpretation on 07/29/2018 at 11:14 pm to Dr. Leonel Ramsay , who verbally acknowledged these results. Electronically Signed   By: Kristine Garbe M.D.   On: 07/29/2018 23:16   Dg Chest 2 View  Result Date: 07/29/2018 CLINICAL DATA:  Hypoxia. EXAM: CHEST - 2 VIEW COMPARISON:  05/14/2008. FINDINGS: AP and lateral views of the chest were obtained. Low volume film with stable asymmetric elevation right hemidiaphragm. Cardiopericardial silhouette is at upper limits of normal for size. Interstitial markings are diffusely coarsened with chronic features. Stable chronic atelectasis or scarring at the right base. The visualized bony structures of the thorax are intact. Telemetry leads overlie the chest. IMPRESSION: Stable. No acute cardiopulmonary findings. Electronically Signed   By: Misty Stanley M.D.   On: 07/29/2018 22:05   Ct Angio Neck W And/or Wo Contrast  Result Date: 07/29/2018 CLINICAL DATA:  85 y/o  M; left leg weakness.  Stroke patient. EXAM: CT ANGIOGRAPHY HEAD AND NECK TECHNIQUE: Multidetector CT imaging of the head and neck was performed using the standard protocol during bolus administration of intravenous contrast. Multiplanar CT image reconstructions and MIPs were obtained to evaluate the vascular anatomy. Carotid stenosis measurements (when applicable) are obtained utilizing NASCET criteria, using the distal internal carotid diameter as the denominator. CONTRAST:  41mL ISOVUE-370 IOPAMIDOL (ISOVUE-370) INJECTION 76% COMPARISON:  07/29/2017 CT head. FINDINGS: CTA NECK FINDINGS Aortic arch: Standard branching. Imaged portion shows no evidence of aneurysm or dissection. No significant stenosis of the major arch vessel origins. Mild calcific atherosclerosis. Right carotid system: No evidence of dissection, stenosis (50% or greater) or occlusion. Mild non stenotic calcific atherosclerosis of the carotid bifurcation. Left carotid system: No evidence of dissection,  stenosis (50% or greater) or occlusion. Mild non stenotic calcific atherosclerosis of the carotid bifurcation. Vertebral arteries: Codominant. No evidence of dissection, stenosis (50% or greater) or occlusion. Skeleton: Advanced spondylosis of the cervical spine. C3-C five vertebral body and facet fusion. Multilevel spinal canal stenosis greatest at the C5-6 level where it is moderate to severe. Other neck: Negative. Upper chest: Negative. Review of the MIP images confirms the above findings CTA HEAD FINDINGS Anterior circulation: No significant stenosis, proximal occlusion, aneurysm, or vascular malformation. Calcific atherosclerosis of carotid siphons with segments of mild less than 50% stenosis. Posterior circulation: No significant stenosis, proximal occlusion, aneurysm, or vascular malformation. Calcific atherosclerosis of left-greater-than-right vertebral arteries with mild less than 50% stenosis. Venous sinuses: As permitted by contrast timing, patent. Anatomic variants: None significant. Delayed phase: No abnormal intracranial enhancement. Review of the MIP images confirms the above findings IMPRESSION: 1. Patent carotid and vertebral arteries. No dissection, aneurysm, or hemodynamically significant stenosis utilizing NASCET criteria. 2. Patent anterior and posterior intracranial circulation. No large vessel occlusion, aneurysm, or significant stenosis. These results were called by telephone at the time of interpretation on 07/29/2018 at 11:14 pm to Dr. Leonel Ramsay , who verbally acknowledged these results. Electronically Signed   By: Kristine Garbe M.D.   On: 07/29/2018 23:16   Mr Brain Wo Contrast  Result Date: 07/29/2018 CLINICAL DATA:  85 y/o  M; sudden onset of left leg weakness. EXAM: MRI HEAD WITHOUT CONTRAST TECHNIQUE: Multiplanar, multiecho  pulse sequences of the brain and surrounding structures were obtained without intravenous contrast. COMPARISON:  07/29/2018 CT head and CTA head.  FINDINGS: Brain: No acute infarction, hemorrhage, hydrocephalus, extra-axial collection or mass lesion. Few punctate nonspecific T2 FLAIR hyperintensities in subcortical and periventricular white matter are compatible with mild chronic microvascular ischemic changes for age. Moderate volume loss of the brain. Vascular: Normal flow voids. Skull and upper cervical spine: Normal marrow signal. Sinuses/Orbits: Mild maxillary sinus mucosal thickening. Normal signal of mastoid air cells. Bilateral intra-ocular lens replacement. Other: None. IMPRESSION: 1. No acute intracranial abnormality identified. 2. Mild chronic microvascular ischemic changes and moderate volume loss of the brain. Electronically Signed   By: Kristine Garbe M.D.   On: 07/29/2018 23:55   Ct Head Code Stroke Wo Contrast  Result Date: 07/29/2018 CLINICAL DATA:  Code stroke. Initial evaluation for acute left-sided weakness. EXAM: CT HEAD WITHOUT CONTRAST TECHNIQUE: Contiguous axial images were obtained from the base of the skull through the vertex without intravenous contrast. COMPARISON:  None. FINDINGS: Brain: Generalized age-related cerebral atrophy with mild chronic small vessel ischemic disease. No acute intracranial hemorrhage. No acute large vessel territory infarct. No mass lesion, midline shift or mass effect. No hydrocephalus. No extra-axial fluid collection. Vascular: No hyperdense vessel. Calcified atherosclerosis at the skull base. Skull: Scalp soft tissues and calvarium within normal limits. Sinuses/Orbits: Globes and orbital soft tissues within normal limits. Scattered mucosal thickening within the paranasal sinuses. No mastoid effusion. Other: None. ASPECTS [facility] Stroke Program Early CT Score) - Ganglionic level infarction (caudate, lentiform nuclei, internal capsule, insula, M1-M3 cortex): 7 - Supraganglionic infarction (M4-M6 cortex): 3 Total score (0-10 with 10 being normal): 10 IMPRESSION: 1. No acute intracranial  infarct or other abnormality identified. 2. ASPECTS is 10. 3. Age-related cerebral atrophy with mild chronic small vessel ischemic disease. These results were communicated to Dr. Leonel Ramsay at 8:58 pmon 1/12/2020by text page via the [facility] messaging system. Electronically Signed   By: Jeannine Boga M.D.   On: 07/29/2018 21:01   2D Echocardiogram  - Left ventricle: The cavity size was normal. Wall thickness was increased in a pattern of mild LVH. Systolic function was normal. The estimated ejection fraction was in the range of 60% to 65%. Wall motion was normal; there were no regional wall motion abnormalities. Doppler parameters are consistent with abnormal left ventricular relaxation (grade 1 diastolic dysfunction). The E/e&' ratio is between 8-15, suggesting indeterminate LV filling pressure. - Aortic valve: Sclerosis without stenosis. There was no regurgitation. - Mitral valve: Mildly thickened leaflets . There was trivial regurgitation. - Left atrium: The atrium was normal in size. - Inferior vena cava: The vessel was normal in size. The respirophasic diameter changes were in the normal range (>= 50%), consistent with normal central venous pressure. Impressions:   LVEF 60-65%, mild LVH, normal wall motion,. grade 1 DD, indeterminate LV filling pressure, trivial MR, normal LA size, normal IVC.   PHYSICAL EXAM Constitutional: Appears well-developed and well-nourished.  Psych: Affect appropriate to situation Eyes: No scleral injection HENT: No OP obstrucion Head: Normocephalic.  Cardiovascular: Normal rate and regular rhythm.  Respiratory: Effort normal, non-labored breathing Skin: B knees reddened with carpet burn appearance  Neuro: Mental Status: Patient is awake, alert, oriented to person, place, month, year, and situation. Patient is able to give a clear and coherent history. No signs of aphasia or neglect Cranial Nerves: II: Visual Fields are full. Pupils are equal, round,  and reactive to light.   III,IV, VI: EOMI without ptosis  or diploplia.  V: Facial sensation is symmetric to temperature VII: Facial movement is symmetric.  VIII: hearing is intact to voice X: Uvula elevates symmetrically XI: Shoulder shrug is symmetric. XII: tongue is midline without atrophy or fasciculations.  Motor: Tone is normal. Bulk is normal. 5/5 strength was present in bilateral arms and right leg, he is able to hold the left leg without drift, with 5 out of 5 knee extension and knee flexion, but he does have 0/5 ankle dorsiflexion and 0/5 left toe extension Sensory: Sensation is symmetric to light touch and temperature in the arms and legs.  There is no clear peroneal distribution or radicular distribution numbness in the left leg Cerebellar: Finger-nose-finger with mild intentional tremor bilaterally   ASSESSMENT/PLAN Mr. CHUCKIE MCCATHERN is a 85 y.o. male with history of HLD, DB and chronic L foot drop presenting with significant leg weakness and drift that resolved.   L brain TIA  Code Stroke CT head No acute stroke. Small vessel disease. Atrophy. ASPECTS 10.     CTA head & neck Patent arteries. No LVO   MRI  No stroke. Small vessel disease. Atrophy.   2D Echo  EF 60-65%. No source of embolus   LDL 47  HgbA1c 8.5  Lovenox 40 mg sq daily for VTE prophylaxis  aspirin 81 mg daily prior to admission, now on aspirin 325 mg daily. Given TIA with ABCD2 score >4, recommend aspirin 81 mg and plavix 75 mg daily x 3 weeks, then PLAVIX alone. Orders adjusted.   Therapy recommendations:  HH PT and OT  Disposition:  Return home  Recommend OP 30d monitor to look for AF as source of TIA Ok for d/c home from stroke standpoint Follow-up Stroke Clinic at [facility] in 4 weeks. Office will call with appointment date and time. Order placed.  Hypertension Hypotension, resolved  On cozaar at home  BP initially low and cozaar stopped  BP normalized  now . BP goal normotensive  Hyperlipidemia  Home meds:  lipitor 20, resumed in hospital  LDL 47, goal < 70  Continue statin at discharge  Diabetes type II  HgbA1c 8.5, goal < 7.0  Uncontrolled  Pt admitted to eating 1-2 biscotti at night over the holidays. He plans to stop  Other Stroke Risk Factors  Advanced age  Obesity, Body mass index is 32.59 kg/m., recommend weight loss, diet and exercise as appropriate   Hospital day # 0  Burnetta Sabin, MSN, APRN, ANVP-BC, AGPCNP-BC Advanced Practice Stroke Nurse [place] for Schedule & Pager information 07/30/2018 12:24 PM   ATTENDING NOTE: I reviewed above note and agree with the assessment and plan. Pt was seen and examined.   85 year old male with history of diabetes, hyperlipidemia admitted for transient left leg weakness.  Denies any deficit on the left arm or face.  He does have chronic left foot drop.  CT negative, CT head and neck unremarkable.  MRI no acute infarct.  LDL 47 and A1c 8.5.  EF 60 to 65%.  Currently neurologic intact except chronic left foot drop.  His presentation consistent with TIA.  Risk factor including uncontrolled diabetes and history of hyperlipidemia but under control with Lipitor 20.  Patient presentation likely to be right ACA territory TIA.  Given his advanced age, recommend 30-day CardioNet monitoring as outpatient to rule out A. fib.  Continue aspirin 81 and Plavix 75 for 3 weeks and then Plavix alone.  Continue Lipitor 20.  Aggressive risk  factor modification.  PT/OT recommend home PT/OT.  Neurology will sign off. Please call with questions. Pt will follow up with stroke clinic NP at [facility] in about 4 weeks. Thanks for the consult.   Rosalin Hawking, MD PhD Stroke Neurology 07/30/2018 5:49 PM   To contact Stroke Continuity provider, please refer to [email or web address] After hours, contact General Neurology

## 2018-07-30 NOTE — Evaluation (Addendum)
Occupational Therapy Evaluation Patient Details Name: Derrick Wilson MRN: [id number] DOB: 07/18/1934 Today's Date: 07/30/2018    History of Present Illness Patient is a 85 y/o male who presents with LLE weakness. Concern for TIA. Head CT and MRI-unremarkable. EKG NSR with RBBB. PMH includes HTN, glaucoma, DM.    Clinical Impression   This 85 y/o male presents with the above. At baseline pt is independent with ADL and functional mobility. Pt mostly limited due to R knee pain during this session. Pt requiring minA for room level functional mobility using RW, intermittent cues for safe RW use. He currently requires setup assist for seated UB ADL, minA for LB ADLs. Pt's family present and supportive during session completion. Pt will benefit from continued acute OT services and recommend follow up [place] services after return home to maximize his overall safety and independence with ADL and mobility. Feel pt will progress well as R knee pain lessens. Will follow.     Follow Up Recommendations  Home health OT;Supervision/Assistance - 24 hour    Equipment Recommendations  3 in 1 bedside commode           Precautions / Restrictions Precautions Precautions: Fall Precaution Comments: foot drop on LLE- chronic Restrictions Weight Bearing Restrictions: No      Mobility Bed Mobility Overal bed mobility: Needs Assistance Bed Mobility: Supine to Sit     Supine to sit: Min guard;HOB elevated     General bed mobility comments: Pt received sitting in recliner  Transfers Overall transfer level: Needs assistance Equipment used: Rolling walker (2 wheeled) Transfers: Sit to/from Stand Sit to Stand: Min assist         General transfer comment: Assist to power to standing with cues for hand placement/technique. Pt with increased effort and difficulty due to pain through RLE.     Balance Overall balance assessment: Needs assistance Sitting-balance support: Feet supported;No upper  extremity supported Sitting balance-Leahy Scale: Good     Standing balance support: During functional activity Standing balance-Leahy Scale: Poor Standing balance comment: Requires UE support in standing due to right knee pain.                            ADL either performed or assessed with clinical judgement   ADL Overall ADL's : Needs assistance/impaired Eating/Feeding: Modified independent;Sitting   Grooming: Set up;Sitting   Upper Body Bathing: Min guard;Sitting   Lower Body Bathing: Minimal assistance;Sit to/from stand Lower Body Bathing Details (indicate cue type and reason): discussed with pt and family option of sponge bathing initially due to safety concerns with stepping into/out of bathtub Upper Body Dressing : Set up;Sitting   Lower Body Dressing: Minimal assistance;Sit to/from stand Lower Body Dressing Details (indicate cue type and reason): pt able to reach LEs despite knee pain, requires minA standing balance Toilet Transfer: Minimal assistance;Ambulation;BSC;RW Toilet Transfer Details (indicate cue type and reason): BSC over toilet; simulated in transfer to/from recliner, room level ambulation Toileting- Clothing Manipulation and Hygiene: Minimal assistance;Sit to/from Nurse, children's Details (indicate cue type and reason): educated on use of 3:1 as shower seat for increased safety Functional mobility during ADLs: Minimal assistance;Rolling walker General ADL Comments: pt mostly limited due to R knee pain     Vision         Perception     Praxis      Pertinent Vitals/Pain Pain Assessment: Faces Faces Pain Scale: Hurts even more Pain  Location: right knee with mobility Pain Descriptors / Indicators: Discomfort;Guarding;Grimacing;Sore Pain Intervention(s): Limited activity within patient's tolerance;Monitored during session;Repositioned;Ice applied     Hand Dominance     Extremity/Trunk Assessment Upper Extremity  Assessment Upper Extremity Assessment: Overall WFL for tasks assessed   Lower Extremity Assessment Lower Extremity Assessment: Defer to PT evaluation RLE Deficits / Details: Grossly ~4/5 throughout RLE Sensation: WNL RLE Coordination: WNL LLE Deficits / Details: Grossly ~3+-4/5 throughout except 1/5 DF- premorbid.  LLE Sensation: WNL LLE Coordination: WNL       Communication Communication Communication: No difficulties   Cognition Arousal/Alertness: Awake/alert Behavior During Therapy: WFL for tasks assessed/performed Overall Cognitive Status: Within Functional Limits for tasks assessed                                     General Comments  pt's son and spouse present end of session    Exercises     Shoulder Instructions      Home Living Family/patient expects to be discharged to:: Private residence Living Arrangements: Spouse/significant other Available Help at Discharge: Family;Available 24 hours/day Type of Home: House Home Access: Stairs to enter [employer] of Steps: 1   Home Layout: Two level Alternate Level Stairs-Number of Steps: 5 + 5 Alternate Level Stairs-Rails: Right Bathroom Shower/Tub: Teacher, early years/pre: Standard     Home Equipment: None          Prior Functioning/Environment Level of Independence: Independent with assistive device(s)        Comments: Drives, cooks, does grocery shopping        OT Problem List: Decreased strength;Decreased activity tolerance;Decreased range of motion;Impaired balance (sitting and/or standing);Pain;Decreased knowledge of use of DME or AE      OT Treatment/Interventions: Self-care/ADL training;Therapeutic exercise;Neuromuscular education;DME and/or AE instruction;Therapeutic activities;Patient/family education;Balance training    OT Goals(Current goals can be found in the care plan section) Acute Rehab OT Goals Patient Stated Goal: to get this knee pain better and  go home OT Goal Formulation: With patient Time For Goal Achievement: 08/13/18 Potential to Achieve Goals: Good  OT Frequency: Min 2X/week   Barriers to D/C:            Co-evaluation              AM-PAC OT "6 Clicks" Daily Activity     Outcome Measure Help from another person eating meals?: None Help from another person taking care of personal grooming?: A Little Help from another person toileting, which includes using toliet, bedpan, or urinal?: A Little Help from another person bathing (including washing, rinsing, drying)?: A Little Help from another person to put on and taking off regular upper body clothing?: None Help from another person to put on and taking off regular lower body clothing?: A Lot 6 Click Score: 19   End of Session Equipment Utilized During Treatment: Gait belt;Rolling walker Nurse Communication: Mobility status  Activity Tolerance: Patient tolerated treatment well;Patient limited by pain Patient left: in chair;with call bell/phone within reach;with chair alarm set;with family/visitor present  OT Visit Diagnosis: Other abnormalities of gait and mobility (R26.89);History of falling (Z91.81);Pain Pain - Right/Left: Right Pain - part of body: Knee                Time: 1208-1255(-42min with MD present) OT Time Calculation (min): 47 min Charges:  OT General Charges $OT Visit: 1 Visit OT Evaluation $OT Eval Moderate  Complexity: 1 Mod OT Treatments $Self Care/Home Management : 8-22 mins  Lou Cal, OT Supplemental Rehabilitation Services Pager [phone] Office [place] 07/30/2018, 1:54 PM

## 2018-07-30 NOTE — Progress Notes (Signed)
Inpatient Diabetes Program Recommendations  AACE/ADA: New Consensus Statement on Inpatient Glycemic Control (2015)  Target Ranges:  Prepandial:   less than 140 mg/dL      Peak postprandial:   less than 180 mg/dL (1-2 hours)      Critically ill patients:  140 - 180 mg/dL   Lab Results  Component Value Date   GLUCAP 279 (H) 07/30/2018   HGBA1C 8.5 (H) 07/29/2018    DM2  Patient preparing to go home this afternoon. Spoke to him and his wife/son regarding his diabetes. Dr. Leanna Battles is his primary MD who manages his diabetes. Patient states MD checks his Hgb A1c quarterly. Made patient aware current Hgb A1c was 8.5% (average of 197 over 2-3 month period). He states he does have a meter at home and checks daily. Encouraged patient to continued doing checks and take with him to his follow up appointments.     Jonna Clark RN, MSN Diabetes Coordinator Inpatient Glycemic Control Team Team Pager: [phone] (8am-5pm)

## 2018-07-30 NOTE — Discharge Summary (Signed)
Physician Discharge Summary  Derrick Wilson GUR:[id number] DOB: 1933/12/05 DOA: 07/29/2018  PCP: Leanna Battles, MD  Admit date: 07/29/2018 Discharge date: 07/30/2018  Admitted From: Home Disposition: [place]  Recommendations for Outpatient Follow-up:  1. Follow up with PCP in 1 week 2. Follow up with Cardiology for 30 day event monitor 3. Please obtain BMP/CBC in one week 4. Please follow up on the following pending results: None  Home Health: PT, OT Equipment/Devices: 3 in 1  Discharge Condition: Stable CODE STATUS: Full code Diet recommendation: Heart healthy/carb modified   Brief/Interim Summary:  Admission HPI written by Rise Patience, MD   Chief Complaint: Left lower extremity weakness.  HPI: Derrick Wilson is a 85 y.o. male with history of hypertension, hyperlipidemia, diabetes mellitus type 2 started developing sudden onset of left lower extremity weakness around 7 PM tonight.  Patient was not able to walk when this happened and was trying to move the trash can.  Neighbors came to help him and EMS was called.  Patient did not have any difficulty with speaking swallowing or any weakness of the other extremities or any visual symptoms.  ED Course: In the ER patient was mildly hypotensive and was given fluid bolus.  Patient states he has not eaten the whole day.  Blood sugars were high.  CT head followed by CT angiogram of the head and neck and MRI of the brain done were negative.  Initially when patient came he was weak on the left side and by the time he was getting a CT his symptoms completely resolved.  Neurology on-call was consulted and patient admitted for TIA work-up.   Hospital course:  TIA CT head and MRI brain negative for acute ischemia.  CT angios with patent carotid and vertebral arteries in addition to intracranial arteries.  LDL of 47, hemoglobin A1c of 8.5%.  Patient is already on statin.  Patient will likely need tighter diabetes control.   Neurology was consulted and is recommending patient be discharged on aspirin and Plavix followed by Plavix alone in 3 weeks in addition to a 30-day event monitor which will be scheduled by cardiology.  Echocardiogram obtained which did not show any thrombus.  Diabetes mellitus Uncontrolled with hyperglycemia.  Patient is on Lantus, metformin, glipizide.  Would recommend patient be discontinued on glipizide at some point in the setting of Lantus.  Resume home regimen on discharge.  Essential hypertension Antihypertensives held on admission secondary to concern of stroke.  Patient can resume home regimen on discharge.  Hyperlipidemia Patient is on atorvastatin 20 mg as an outpatient.  LDL 47 as mentioned above.  Continue atorvastatin.  Diastolic heart dysfunction No history of acute or chronic heart failure.  Discharge Diagnoses:  Principal Problem:   TIA (transient ischemic attack) Active Problems:   Type 2 diabetes mellitus with vascular disease ([place])   Essential hypertension    Discharge Instructions   Allergies as of 07/30/2018   No Known Allergies     Medication List    TAKE these medications   ALPHAGAN P 0.15 % ophthalmic solution Generic drug:  brimonidine Place 1 drop into both eyes at bedtime. Use one drop each eye bid   aspirin 81 MG tablet Take 1 tablet (81 mg total) by mouth every evening for 21 days.   atorvastatin 20 MG tablet Commonly known as:  LIPITOR Take 20 mg by mouth every evening.   clopidogrel 75 MG tablet Commonly known as:  PLAVIX Take 1 tablet (75 mg  total) by mouth daily. Start taking on:  July 31, 2018   glipiZIDE 5 MG 24 hr tablet Commonly known as:  GLUCOTROL XL Take 5 mg by mouth daily with breakfast.   glucose blood test strip 1 each by Other route as needed. Use as instructed   INVOKANA 100 MG Tabs tablet Generic drug:  canagliflozin Take 100 mg by mouth every morning.   LANTUS 100 UNIT/ML injection Generic drug:  insulin  glargine Inject 20 Units into the skin every morning.   latanoprost 0.005 % ophthalmic solution Commonly known as:  XALATAN Place 1 drop into both eyes at bedtime.   losartan 100 MG tablet Commonly known as:  COZAAR Take 100 mg by mouth every morning.   metFORMIN 1000 MG tablet Commonly known as:  GLUCOPHAGE Take 1,000 mg by mouth at bedtime.   multivitamin tablet Take 1 tablet by mouth every morning. Mature multi-vitamin and minerals   OMEGA-3 KRILL OIL PO Take 1 capsule by mouth every morning.   ONETOUCH DELICA LANCETS Misc by Does not apply route daily.   timolol 0.25 % ophthalmic solution Commonly known as:  TIMOPTIC Place 1 drop into both eyes every morning.            Durable Medical Equipment  (From admission, onward)         Start     Ordered   07/30/18 1427  For home use only DME 3 n 1  Once     07/30/18 1426   07/30/18 1427  For home use only DME Walker rolling  Once    Question:  Patient needs a walker to treat with the following condition  Answer:  TIA (transient ischemic attack)   07/30/18 1426   07/30/18 1416  For home use only DME Walker rolling  Once    Question:  Patient needs a walker to treat with the following condition  Answer:  TIA (transient ischemic attack)   07/30/18 1419   07/30/18 1415  For home use only DME 3 n 1  Once     07/30/18 1419         Follow-up Information    Leanna Battles, MD. Schedule an appointment as soon as possible for a visit in 1 week(s).   Specialty:  Internal Medicine Why:  Hospital follow-up Contact information: [address] [place] [address] [phone]          No Known Allergies  Consultations:  Neurology/stroke   Procedures/Studies: Ct Angio Head W Or Wo Contrast  Result Date: 07/29/2018 CLINICAL DATA:  85 y/o  M; left leg weakness.  Stroke patient. EXAM: CT ANGIOGRAPHY HEAD AND NECK TECHNIQUE: Multidetector CT imaging of the head and neck was performed using the standard  protocol during bolus administration of intravenous contrast. Multiplanar CT image reconstructions and MIPs were obtained to evaluate the vascular anatomy. Carotid stenosis measurements (when applicable) are obtained utilizing NASCET criteria, using the distal internal carotid diameter as the denominator. CONTRAST:  43mL ISOVUE-370 IOPAMIDOL (ISOVUE-370) INJECTION 76% COMPARISON:  07/29/2017 CT head. FINDINGS: CTA NECK FINDINGS Aortic arch: Standard branching. Imaged portion shows no evidence of aneurysm or dissection. No significant stenosis of the major arch vessel origins. Mild calcific atherosclerosis. Right carotid system: No evidence of dissection, stenosis (50% or greater) or occlusion. Mild non stenotic calcific atherosclerosis of the carotid bifurcation. Left carotid system: No evidence of dissection, stenosis (50% or greater) or occlusion. Mild non stenotic calcific atherosclerosis of the carotid bifurcation. Vertebral arteries: Codominant. No evidence of  dissection, stenosis (50% or greater) or occlusion. Skeleton: Advanced spondylosis of the cervical spine. C3-C five vertebral body and facet fusion. Multilevel spinal canal stenosis greatest at the C5-6 level where it is moderate to severe. Other neck: Negative. Upper chest: Negative. Review of the MIP images confirms the above findings CTA HEAD FINDINGS Anterior circulation: No significant stenosis, proximal occlusion, aneurysm, or vascular malformation. Calcific atherosclerosis of carotid siphons with segments of mild less than 50% stenosis. Posterior circulation: No significant stenosis, proximal occlusion, aneurysm, or vascular malformation. Calcific atherosclerosis of left-greater-than-right vertebral arteries with mild less than 50% stenosis. Venous sinuses: As permitted by contrast timing, patent. Anatomic variants: None significant. Delayed phase: No abnormal intracranial enhancement. Review of the MIP images confirms the above findings  IMPRESSION: 1. Patent carotid and vertebral arteries. No dissection, aneurysm, or hemodynamically significant stenosis utilizing NASCET criteria. 2. Patent anterior and posterior intracranial circulation. No large vessel occlusion, aneurysm, or significant stenosis. These results were called by telephone at the time of interpretation on 07/29/2018 at 11:14 pm to Dr. Leonel Ramsay , who verbally acknowledged these results. Electronically Signed   By: Kristine Garbe M.D.   On: 07/29/2018 23:16   Dg Chest 2 View  Result Date: 07/29/2018 CLINICAL DATA:  Hypoxia. EXAM: CHEST - 2 VIEW COMPARISON:  05/14/2008. FINDINGS: AP and lateral views of the chest were obtained. Low volume film with stable asymmetric elevation right hemidiaphragm. Cardiopericardial silhouette is at upper limits of normal for size. Interstitial markings are diffusely coarsened with chronic features. Stable chronic atelectasis or scarring at the right base. The visualized bony structures of the thorax are intact. Telemetry leads overlie the chest. IMPRESSION: Stable. No acute cardiopulmonary findings. Electronically Signed   By: Misty Stanley M.D.   On: 07/29/2018 22:05   Ct Angio Neck W And/or Wo Contrast  Result Date: 07/29/2018 CLINICAL DATA:  85 y/o  M; left leg weakness.  Stroke patient. EXAM: CT ANGIOGRAPHY HEAD AND NECK TECHNIQUE: Multidetector CT imaging of the head and neck was performed using the standard protocol during bolus administration of intravenous contrast. Multiplanar CT image reconstructions and MIPs were obtained to evaluate the vascular anatomy. Carotid stenosis measurements (when applicable) are obtained utilizing NASCET criteria, using the distal internal carotid diameter as the denominator. CONTRAST:  25mL ISOVUE-370 IOPAMIDOL (ISOVUE-370) INJECTION 76% COMPARISON:  07/29/2017 CT head. FINDINGS: CTA NECK FINDINGS Aortic arch: Standard branching. Imaged portion shows no evidence of aneurysm or dissection. No  significant stenosis of the major arch vessel origins. Mild calcific atherosclerosis. Right carotid system: No evidence of dissection, stenosis (50% or greater) or occlusion. Mild non stenotic calcific atherosclerosis of the carotid bifurcation. Left carotid system: No evidence of dissection, stenosis (50% or greater) or occlusion. Mild non stenotic calcific atherosclerosis of the carotid bifurcation. Vertebral arteries: Codominant. No evidence of dissection, stenosis (50% or greater) or occlusion. Skeleton: Advanced spondylosis of the cervical spine. C3-C five vertebral body and facet fusion. Multilevel spinal canal stenosis greatest at the C5-6 level where it is moderate to severe. Other neck: Negative. Upper chest: Negative. Review of the MIP images confirms the above findings CTA HEAD FINDINGS Anterior circulation: No significant stenosis, proximal occlusion, aneurysm, or vascular malformation. Calcific atherosclerosis of carotid siphons with segments of mild less than 50% stenosis. Posterior circulation: No significant stenosis, proximal occlusion, aneurysm, or vascular malformation. Calcific atherosclerosis of left-greater-than-right vertebral arteries with mild less than 50% stenosis. Venous sinuses: As permitted by contrast timing, patent. Anatomic variants: None significant. Delayed phase: No abnormal intracranial enhancement. Review  of the MIP images confirms the above findings IMPRESSION: 1. Patent carotid and vertebral arteries. No dissection, aneurysm, or hemodynamically significant stenosis utilizing NASCET criteria. 2. Patent anterior and posterior intracranial circulation. No large vessel occlusion, aneurysm, or significant stenosis. These results were called by telephone at the time of interpretation on 07/29/2018 at 11:14 pm to Dr. Leonel Ramsay , who verbally acknowledged these results. Electronically Signed   By: Kristine Garbe M.D.   On: 07/29/2018 23:16   Mr Brain Wo Contrast  Result  Date: 07/29/2018 CLINICAL DATA:  85 y/o  M; sudden onset of left leg weakness. EXAM: MRI HEAD WITHOUT CONTRAST TECHNIQUE: Multiplanar, multiecho pulse sequences of the brain and surrounding structures were obtained without intravenous contrast. COMPARISON:  07/29/2018 CT head and CTA head. FINDINGS: Brain: No acute infarction, hemorrhage, hydrocephalus, extra-axial collection or mass lesion. Few punctate nonspecific T2 FLAIR hyperintensities in subcortical and periventricular white matter are compatible with mild chronic microvascular ischemic changes for age. Moderate volume loss of the brain. Vascular: Normal flow voids. Skull and upper cervical spine: Normal marrow signal. Sinuses/Orbits: Mild maxillary sinus mucosal thickening. Normal signal of mastoid air cells. Bilateral intra-ocular lens replacement. Other: None. IMPRESSION: 1. No acute intracranial abnormality identified. 2. Mild chronic microvascular ischemic changes and moderate volume loss of the brain. Electronically Signed   By: Kristine Garbe M.D.   On: 07/29/2018 23:55   Ct Head Code Stroke Wo Contrast  Result Date: 07/29/2018 CLINICAL DATA:  Code stroke. Initial evaluation for acute left-sided weakness. EXAM: CT HEAD WITHOUT CONTRAST TECHNIQUE: Contiguous axial images were obtained from the base of the skull through the vertex without intravenous contrast. COMPARISON:  None. FINDINGS: Brain: Generalized age-related cerebral atrophy with mild chronic small vessel ischemic disease. No acute intracranial hemorrhage. No acute large vessel territory infarct. No mass lesion, midline shift or mass effect. No hydrocephalus. No extra-axial fluid collection. Vascular: No hyperdense vessel. Calcified atherosclerosis at the skull base. Skull: Scalp soft tissues and calvarium within normal limits. Sinuses/Orbits: Globes and orbital soft tissues within normal limits. Scattered mucosal thickening within the paranasal sinuses. No mastoid effusion.  Other: None. ASPECTS [facility] Stroke Program Early CT Score) - Ganglionic level infarction (caudate, lentiform nuclei, internal capsule, insula, M1-M3 cortex): 7 - Supraganglionic infarction (M4-M6 cortex): 3 Total score (0-10 with 10 being normal): 10 IMPRESSION: 1. No acute intracranial infarct or other abnormality identified. 2. ASPECTS is 10. 3. Age-related cerebral atrophy with mild chronic small vessel ischemic disease. These results were communicated to Dr. Leonel Ramsay at 8:58 pmon 1/12/2020by text page via the [facility] messaging system. Electronically Signed   By: Jeannine Boga M.D.   On: 07/29/2018 21:01     Transthoracic Echocardiogram (07/30/18) Study Conclusions  - Left ventricle: The cavity size was normal. Wall thickness was   increased in a pattern of mild LVH. Systolic function was normal.   The estimated ejection fraction was in the range of 60% to 65%.   Wall motion was normal; there were no regional wall motion   abnormalities. Doppler parameters are consistent with abnormal   left ventricular relaxation (grade 1 diastolic dysfunction). The   E/e&' ratio is between 8-15, suggesting indeterminate LV filling   pressure. - Aortic valve: Sclerosis without stenosis. There was no   regurgitation. - Mitral valve: Mildly thickened leaflets . There was trivial   regurgitation. - Left atrium: The atrium was normal in size. - Inferior vena cava: The vessel was normal in size. The   respirophasic diameter changes were in the  normal range (>= 50%),   consistent with normal central venous pressure.  Impressions:  - LVEF 60-65%, mild LVH, normal wall motion,. grade 1 DD,   indeterminate LV filling pressure, trivial MR, normal LA size,   normal IVC.   Subjective: No issues today. Weakness improved.  Discharge Exam: Vitals:   07/30/18 0554 07/30/18 1217  BP: 133/68 125/69  Pulse: 88 75  Resp: 20   Temp: 98.7 F (37.1 C) 98.2 F (36.8 C)  SpO2: 94% 95%   Vitals:    07/30/18 0155 07/30/18 0355 07/30/18 0554 07/30/18 1217  BP: 124/68 (!) 149/72 133/68 125/69  Pulse: 85 85 88 75  Resp: 20 20 20    Temp: 98.7 F (37.1 C) 98.7 F (37.1 C) 98.7 F (37.1 C) 98.2 F (36.8 C)  TempSrc: Oral Oral Oral Oral  SpO2: 94% 95% 94% 95%  Weight:      Height:        General: Pt is alert, awake, not in acute distress Cardiovascular: RRR, S1/S2 +, no rubs, no gallops Respiratory: CTA bilaterally, no wheezing, no rhonchi Abdominal: Soft, NT, ND, bowel sounds + Extremities: LE pitting edema, no cyanosis    The results of significant diagnostics from this hospitalization (including imaging, microbiology, ancillary and laboratory) are listed below for reference.     Microbiology: No results found for this or any previous visit (from the past 240 hour(s)).   Labs: Basic Metabolic Panel: Recent Labs  Lab 07/29/18 2020 07/29/18 2032  NA 138 139  K 4.1 4.1  CL 105 105  CO2 19*  --   GLUCOSE 269* 269*  BUN 16 19  CREATININE 1.23 1.10  CALCIUM 9.3  --    Liver Function Tests: Recent Labs  Lab 07/29/18 2020  AST 30  ALT 21  ALKPHOS 81  BILITOT 1.4*  PROT 6.1*  ALBUMIN 3.7   CBC: Recent Labs  Lab 07/29/18 2020 07/29/18 2032  WBC 11.5*  --   NEUTROABS 9.6*  --   HGB 16.3 16.7  HCT 50.9 49.0  MCV 93.1  --   PLT 174  --    CBG: Recent Labs  Lab 07/30/18 0641 07/30/18 1304  GLUCAP 148* 279*   Hgb A1c Recent Labs    07/29/18 2020  HGBA1C 8.5*   Lipid Profile Recent Labs    07/29/18 2112  CHOL 97  HDL 28*  LDLCALC 47  TRIG 111  CHOLHDL 3.5   Urinalysis    Component Value Date/Time   COLORURINE YELLOW 07/30/2018 1259   APPEARANCEUR CLEAR 07/30/2018 1259   LABSPEC 1.033 (H) 07/30/2018 1259   PHURINE 5.0 07/30/2018 1259   GLUCOSEU >=500 (A) 07/30/2018 1259   HGBUR NEGATIVE 07/30/2018 1259   BILIRUBINUR NEGATIVE 07/30/2018 1259   KETONESUR 5 (A) 07/30/2018 1259   PROTEINUR NEGATIVE 07/30/2018 1259   NITRITE NEGATIVE  07/30/2018 1259   LEUKOCYTESUR NEGATIVE 07/30/2018 1259    SIGNED:   Cordelia Poche, MD [facility] 07/30/2018, 2:21 PM

## 2018-07-30 NOTE — Plan of Care (Signed)
  Problem: Education: Goal: Knowledge of General Education information will improve Description: Including pain rating scale, medication(s)/side effects and non-pharmacologic comfort measures Outcome: Progressing   Problem: Health Behavior/Discharge Planning: Goal: Ability to manage health-related needs will improve Outcome: Progressing   Problem: Safety: Goal: Ability to remain free from injury will improve Outcome: Progressing   

## 2018-07-30 NOTE — Evaluation (Signed)
Physical Therapy Evaluation Patient Details Name: AMARIE TARTE MRN: [id number] DOB: 09-13-1933 Today's Date: 07/30/2018   History of Present Illness  Patient is a 85 y/o male who presents with LLE weakness. Concern for TIA. Head CT and MRI-unremarkable. EKG NSR with RBBB. PMH includes HTN, glaucoma, DM.   Clinical Impression  Patient presents with right knee pain, hx of left foot drop and impaired mobility s/p above. Pt independent PTA and lives with wife. Drives and does grocery shopping. Reports right knee pain (from fall) is limiting mobility today. Tolerated gait training with Min guard assist for balance/safety as well as cues for proper use of DME. Pt's bedroom is on second level at home. Will need to be able to negotiate steps prior to d/c. Education re: BeFAST, RW use and safety. Will follow acutely to maximize independence and mobility prior to return home.     Follow Up Recommendations Home health PT;Supervision for mobility/OOB    Equipment Recommendations  Rolling walker with 5" wheels    Recommendations for Other Services       Precautions / Restrictions Precautions Precautions: Fall Precaution Comments: foot drop on LLE- chronic Restrictions Weight Bearing Restrictions: No      Mobility  Bed Mobility Overal bed mobility: Needs Assistance Bed Mobility: Supine to Sit     Supine to sit: Min guard;HOB elevated     General bed mobility comments: Increased time and effort to get to EOB with reports of right knee pain from fall.   Transfers Overall transfer level: Needs assistance Equipment used: Rolling walker (2 wheeled) Transfers: Sit to/from Stand Sit to Stand: Min assist         General transfer comment: Assist to power to standing with cues for hand placement/technique. Stood from [employer] x2 with increased effort and difficulty due ot pain through RLE. Transferred to chair post ambulation.  Ambulation/Gait Ambulation/Gait assistance: Min guard;Min  assist Gait Distance (Feet): 20 Feet(+15') Assistive device: Rolling walker (2 wheeled) Gait Pattern/deviations: Step-to pattern;Decreased dorsiflexion - left;Decreased stance time - right;Wide base of support;Trunk flexed Gait velocity: decreased   General Gait Details: Slow, mildly unsteady gait with cues for RW management/proximity- pain through right knee limiting stance time and foot drop in left which is chronic. Limited.   Stairs            Wheelchair Mobility    Modified Rankin (Stroke Patients Only) Modified Rankin (Stroke Patients Only) Pre-Morbid Rankin Score: Slight disability Modified Rankin: Moderately severe disability     Balance Overall balance assessment: Needs assistance Sitting-balance support: Feet supported;No upper extremity supported Sitting balance-Leahy Scale: Good     Standing balance support: During functional activity Standing balance-Leahy Scale: Poor Standing balance comment: Requires UE support in standing due to right knee pain.                              Pertinent Vitals/Pain Pain Assessment: Faces Faces Pain Scale: Hurts even more Pain Location: right knee with mobility Pain Descriptors / Indicators: Discomfort;Guarding;Grimacing;Sore Pain Intervention(s): Monitored during session;Repositioned;Patient requesting pain meds-RN notified;Limited activity within patient's tolerance    Home Living Family/patient expects to be discharged to:: Private residence Living Arrangements: Spouse/significant other Available Help at Discharge: Family;Available 24 hours/day Type of Home: House Home Access: Stairs to enter   [employer] of Steps: 1 Home Layout: Two level Home Equipment: None      Prior Function Level of Independence: Independent with assistive device(s)  Comments: Drives, cooks, does grocery shopping     Hand Dominance        Extremity/Trunk Assessment   Upper Extremity  Assessment Upper Extremity Assessment: Defer to OT evaluation    Lower Extremity Assessment Lower Extremity Assessment: RLE deficits/detail;LLE deficits/detail RLE Deficits / Details: Grossly ~4/5 throughout RLE Sensation: WNL RLE Coordination: WNL LLE Deficits / Details: Grossly ~3+-4/5 throughout except 1/5 DF- premorbid.  LLE Sensation: WNL LLE Coordination: WNL       Communication   Communication: No difficulties  Cognition Arousal/Alertness: Awake/alert Behavior During Therapy: WFL for tasks assessed/performed Overall Cognitive Status: Within Functional Limits for tasks assessed                                        General Comments      Exercises     Assessment/Plan    PT Assessment Patient needs continued PT services  PT Problem List Decreased strength;Decreased balance;Pain;Decreased knowledge of use of DME;Decreased activity tolerance       PT Treatment Interventions Functional mobility training;Balance training;Patient/family education;Gait training;Therapeutic activities;Stair training;Therapeutic exercise;DME instruction    PT Goals (Current goals can be found in the Care Plan section)  Acute Rehab PT Goals Patient Stated Goal: to get this knee pain better and go home PT Goal Formulation: With patient Time For Goal Achievement: 08/13/18 Potential to Achieve Goals: Good    Frequency Min 4X/week   Barriers to discharge Inaccessible home environment stairs to get to bedroom    Co-evaluation               AM-PAC PT "6 Clicks" Mobility  Outcome Measure Help needed turning from your back to your side while in a flat bed without using bedrails?: A Little Help needed moving from lying on your back to sitting on the side of a flat bed without using bedrails?: A Little Help needed moving to and from a bed to a chair (including a wheelchair)?: A Little Help needed standing up from a chair using your arms (e.g., wheelchair or bedside  chair)?: A Little Help needed to walk in hospital room?: A Little Help needed climbing 3-5 steps with a railing? : A Lot 6 Click Score: 17    End of Session Equipment Utilized During Treatment: Gait belt Activity Tolerance: Patient limited by pain Patient left: in chair;with call bell/phone within reach;with chair alarm set Nurse Communication: Mobility status;Other (comment);Patient requests pain meds(breakfast needs to be orderes ) PT Visit Diagnosis: Pain;Difficulty in walking, not elsewhere classified (R26.2) Pain - Right/Left: Right Pain - part of body: Knee    Time: 2952-8413 PT Time Calculation (min) (ACUTE ONLY): 38 min   Charges:   PT Evaluation $PT Eval Low Complexity: 1 Low PT Treatments $Gait Training: 23-37 mins        Wray Kearns, PT, DPT Acute Rehabilitation Services Pager [phone] Office [place] 07/30/2018, 10:17 AM

## 2018-07-30 NOTE — Discharge Instructions (Signed)
Derrick Wilson,  You have concern for a TIA. Your medications have been adjusted. Please follow-up with the neurologist. You will also need a monitor for your heart for 30 days. The cardiology office should get in touch with you.

## 2018-08-01 DIAGNOSIS — Z9181 History of falling: Secondary | ICD-10-CM | POA: Diagnosis not present

## 2018-08-01 DIAGNOSIS — E1159 Type 2 diabetes mellitus with other circulatory complications: Secondary | ICD-10-CM | POA: Diagnosis not present

## 2018-08-01 DIAGNOSIS — N400 Enlarged prostate without lower urinary tract symptoms: Secondary | ICD-10-CM | POA: Diagnosis not present

## 2018-08-01 DIAGNOSIS — H409 Unspecified glaucoma: Secondary | ICD-10-CM | POA: Diagnosis not present

## 2018-08-01 DIAGNOSIS — I69354 Hemiplegia and hemiparesis following cerebral infarction affecting left non-dominant side: Secondary | ICD-10-CM | POA: Diagnosis not present

## 2018-08-01 DIAGNOSIS — I10 Essential (primary) hypertension: Secondary | ICD-10-CM | POA: Diagnosis not present

## 2018-08-03 DIAGNOSIS — G458 Other transient cerebral ischemic attacks and related syndromes: Secondary | ICD-10-CM | POA: Diagnosis not present

## 2018-08-03 DIAGNOSIS — G4733 Obstructive sleep apnea (adult) (pediatric): Secondary | ICD-10-CM | POA: Diagnosis not present

## 2018-08-03 DIAGNOSIS — M21372 Foot drop, left foot: Secondary | ICD-10-CM | POA: Diagnosis not present

## 2018-08-03 DIAGNOSIS — E1151 Type 2 diabetes mellitus with diabetic peripheral angiopathy without gangrene: Secondary | ICD-10-CM | POA: Diagnosis not present

## 2018-08-29 DIAGNOSIS — E1151 Type 2 diabetes mellitus with diabetic peripheral angiopathy without gangrene: Secondary | ICD-10-CM | POA: Diagnosis not present

## 2018-08-29 DIAGNOSIS — Z794 Long term (current) use of insulin: Secondary | ICD-10-CM | POA: Diagnosis not present

## 2018-08-29 DIAGNOSIS — I10 Essential (primary) hypertension: Secondary | ICD-10-CM | POA: Diagnosis not present

## 2018-09-13 ENCOUNTER — Other Ambulatory Visit: Payer: Self-pay

## 2018-09-13 ENCOUNTER — Encounter: Payer: Self-pay | Admitting: Adult Health

## 2018-09-13 ENCOUNTER — Ambulatory Visit: Payer: Medicare Other | Admitting: Adult Health

## 2018-09-13 VITALS — BP 112/63 | HR 78 | Resp 18 | Ht 69.0 in | Wt 227.0 lb

## 2018-09-13 DIAGNOSIS — G459 Transient cerebral ischemic attack, unspecified: Secondary | ICD-10-CM

## 2018-09-13 DIAGNOSIS — I10 Essential (primary) hypertension: Secondary | ICD-10-CM | POA: Diagnosis not present

## 2018-09-13 DIAGNOSIS — E1159 Type 2 diabetes mellitus with other circulatory complications: Secondary | ICD-10-CM

## 2018-09-13 DIAGNOSIS — E785 Hyperlipidemia, unspecified: Secondary | ICD-10-CM | POA: Diagnosis not present

## 2018-09-13 NOTE — Progress Notes (Signed)
I agree with the above plan 

## 2018-09-13 NOTE — Progress Notes (Signed)
[facility] [address] [name]. [place] [address] [phone]       OFFICE FOLLOW UP NOTE  Derrick Wilson Date of Birth:  09-25-1933 Medical Record Number:  [id number]   Reason for Referral:  hospital stroke follow up  CHIEF COMPLAINT:  Chief Complaint  Patient presents with  . Cerebrovascular Accident    Treatment Rm. Zuhair is here with his son Derrick Wilson for eval of stroke like sx. Sts. he has a steep driveway, and on 0/93/26, he had taken his trashcan down the driveway, was unable to  get back up the driveway due to left leg weakness, specifically his knee.  Admitted to the hospital, but no definite stroke seen. Denies reoccurrance of sx/fim  . Hospitalization Follow-up    HPI: Derrick Wilson is being seen today for initial visit in the office for right ACA territory TIA on 07/29/2018. History obtained from patient, son and chart review. Reviewed all radiology images and labs personally.  Derrick Wilson is a 85 y.o. male with history of HLD, DB and chronic L foot drop  who presented with significant left leg weakness and drift that resolved.   CT head reviewed and was negative for acute infarct but did show evidence of the small vessel disease and atrophy.  CTA head and neck showed patent arteries without evidence of LVO.  MRI brain reviewed and was negative for acute infarct but evidence of small vessel disease and atrophy.  2D echo showed an EF of 60 to 65% without cardiac source of embolus.  LDL 47 and A1c 8.5.  Determined his symptoms were likely related to right ACA territory TIA and recommended undergoing 30-day cardiac event monitor outpatient to look for possible A. fib as source of TIA given his advanced age.  He was previously on aspirin 81 mg and recommended DAPT for 3 weeks and Plavix alone along with continuation of atorvastatin 20 mg daily for continued HDL management.  Due to elevated A1c, recommended to glycemic control with close PCP follow-up  for DM management.  Therapy recommended home PT/OT and was discharged in stable condition.  Derrick Wilson is a 85 year old male who is being seen today for hospital follow-up and is accompanied by his son.  He denies any recurrent or new stroke/TIA symptoms.  He has continued DAPT despite 3-week recommendation but denies bleeding or bruising.  Continues on atorvastatin without side effects myalgias. Blood pressure today 112/63. Denies new or worsening stroke/TIA symptoms.    ROS:   14 system review of systems performed and negative with exception of restless legs  PMH:  Past Medical History:  Diagnosis Date  . Diabetes mellitus   . Edema leg    legs   . Glaucoma   . Hyperlipidemia     PSH:  Past Surgical History:  Procedure Laterality Date  . APPENDECTOMY    . CHOLECYSTECTOMY    . COLONOSCOPY    . TONSILLECTOMY AND ADENOIDECTOMY      Social History:  Social History   Socioeconomic History  . Marital status: Married    Spouse name: Derrick Wilson  . Number of children: 1  . Years of education: Not on file  . Highest education level: Bachelor's degree (e.g., BA, AB, BS)  Occupational History  . Occupation: Freight forwarder in Psychiatrist.    Comment: [employer]  Social Needs  . Financial resource strain: Not on file  . Food insecurity:    Worry: Not on file    Inability:  Not on file  . Transportation needs:    Medical: Not on file    Non-medical: Not on file  Tobacco Use  . Smoking status: Never Smoker  . Smokeless tobacco: Never Used  Substance and Sexual Activity  . Alcohol use: No  . Drug use: No  . Sexual activity: Not on file  Lifestyle  . Physical activity:    Days per week: Not on file    Minutes per session: Not on file  . Stress: Not on file  Relationships  . Social connections:    Talks on phone: Not on file    Gets together: Not on file    Attends religious service: Not on file    Active member of club or organization: Not on file    Attends  meetings of clubs or organizations: Not on file    Relationship status: Not on file  . Intimate partner violence:    Fear of current or ex partner: Not on file    Emotionally abused: Not on file    Physically abused: Not on file    Forced sexual activity: Not on file  Other Topics Concern  . Not on file  Social History Narrative  . Not on file    Family History:  Family History  Problem Relation Age of Onset  . Diabetes Father   . Stroke Father     Medications:   Current Outpatient Medications on File Prior to Visit  Medication Sig Dispense Refill  . atorvastatin (LIPITOR) 20 MG tablet Take 20 mg by mouth every evening.    . brimonidine (ALPHAGAN P) 0.15 % ophthalmic solution Place 1 drop into both eyes at bedtime. Use one drop each eye bid     . Canagliflozin (INVOKANA) 100 MG TABS Take 100 mg by mouth every morning.     . clopidogrel (PLAVIX) 75 MG tablet Take 1 tablet (75 mg total) by mouth daily. 30 tablet 0  . glipiZIDE (GLUCOTROL XL) 5 MG 24 hr tablet Take 5 mg by mouth daily with breakfast.    . glucose blood test strip 1 each by Other route as needed. Use as instructed    . insulin glargine (LANTUS) 100 UNIT/ML injection Inject 20 Units into the skin every morning.     . latanoprost (XALATAN) 0.005 % ophthalmic solution Place 1 drop into both eyes at bedtime.    Marland Kitchen losartan (COZAAR) 100 MG tablet Take 100 mg by mouth every morning.     . metFORMIN (GLUCOPHAGE) 1000 MG tablet Take 1,000 mg by mouth at bedtime.     . Multiple Vitamin (MULTIVITAMIN) tablet Take 1 tablet by mouth every morning. Mature multi-vitamin and minerals    . OMEGA-3 KRILL OIL PO Take 1 capsule by mouth every morning.    Glory Rosebush DELICA LANCETS MISC by Does not apply route daily.    . timolol (TIMOPTIC) 0.25 % ophthalmic solution Place 1 drop into both eyes every morning.      No current facility-administered medications on file prior to visit.     Allergies:  No Known Allergies   Physical  Exam  Vitals:   09/13/18 0913  BP: 112/63  Pulse: 78  Resp: 18  Weight: 227 lb (103 kg)  Height: 5\' 9"  (1.753 m)   Body mass index is 33.52 kg/m.  Visual Acuity Screening   Right eye Left eye Both eyes  Without correction: 20/50 20/50 20/50   With correction:       No flowsheet data  found.   General: well developed, well nourished, pleasant elderly Caucasian male, seated, in no evident distress Head: head normocephalic and atraumatic.   Neck: supple with no carotid or supraclavicular bruits Cardiovascular: regular rate and rhythm, no murmurs Musculoskeletal: no deformity Skin:  no rash/petichiae Vascular:  Normal pulses all extremities  Neurologic Exam Mental Status: Awake and fully alert. Oriented to place and time. Recent and remote memory intact. Attention span, concentration and fund of knowledge appropriate. Mood and affect appropriate.  Cranial Nerves: Fundoscopic exam reveals sharp disc margins. Pupils equal, briskly reactive to light. Extraocular movements full without nystagmus. Visual fields full to confrontation. Hearing intact. Facial sensation intact. Face, tongue, palate moves normally and symmetrically.  Motor: Normal bulk and tone. Normal strength in all tested extremity muscles except for chronic left foot drop Sensory.: intact to touch , pinprick , position and vibratory sensation.  Coordination: Rapid alternating movements normal in all extremities. Finger-to-nose and heel-to-shin performed accurately bilaterally. Gait and Station: Arises from chair without difficulty. Stance is normal. Gait demonstrates normal stride length and balance. Able to heel, toe and tandem walk with mild difficulty.  Reflexes: 1+ and symmetric. Toes downgoing.    NIHSS  0 Modified Rankin  0    Diagnostic Data (Labs, Imaging, Testing)  CT HEAD WO CONTRAST 07/29/2018 IMPRESSION: 1. No acute intracranial infarct or other abnormality identified. 2. ASPECTS is 10. 3. Age-related  cerebral atrophy with mild chronic small vessel ischemic disease.  CT ANGIO HEAD W OR WO CONTRAST CT ANGIO NECK W OR WO CONTRAST 07/29/2018 IMPRESSION: 1. Patent carotid and vertebral arteries. No dissection, aneurysm, or hemodynamically significant stenosis utilizing NASCET criteria. 2. Patent anterior and posterior intracranial circulation. No large vessel occlusion, aneurysm, or significant stenosis.  MR BRAIN WO CONTRAST 07/29/2018 IMPRESSION: 1. No acute intracranial abnormality identified. 2. Mild chronic microvascular ischemic changes and moderate volume loss of the brain.  ECHOCARDIOGRAM 07/30/2018 - Left ventricle: The cavity size was normal. Wall thickness wasincreased in a pattern of mild LVH. Systolic function was normal.The estimated ejection fraction was in the range of 60% to 65%.Wall motion was normal; there were no regional wall motionabnormalities. Doppler parameters are consistent with abnormalleft ventricular relaxation (grade 1 diastolic dysfunction). TheE/e&' ratio is between 8-15, suggesting indeterminate LV fillingpressure. - Aortic valve: Sclerosis without stenosis. There was noregurgitation. - Mitral valve: Mildly thickened leaflets . There was trivialregurgitation. - Left atrium: The atrium was normal in size. - Inferior vena cava: The vessel was normal in size. Therespirophasic diameter changes were in the normal range (>= 50%),consistent with normal central venous pressure. Impressions:   LVEF 60-65%, mild LVH, normal wall motion,. grade 1 DD,indeterminate LV filling pressure, trivial MR, normal LA size,normal IVC.   ASSESSMENT: Derrick Wilson is a 85 y.o. year old male here with right ACA territory TIA on 07/29/2018. Vascular risk factors include DM, HTN, and HLD.  He is being seen today for hospital follow-up without recurring or new stroke/TIA symptoms.    PLAN:  1. TIA: Continue clopidogrel 75 mg daily  and atorvastatin 20 mg daily for  secondary stroke prevention.  Advised to discontinue aspirin 81 mg at this time as 3-week DAPT completed and continue on Plavix alone.  Maintain strict control of hypertension with blood pressure goal below 130/90, diabetes with hemoglobin A1c goal below 6.5% and cholesterol with LDL cholesterol (bad cholesterol) goal below 70 mg/dL.  I also advised the patient to eat a healthy diet with plenty of whole grains, cereals, fruits and vegetables,  exercise regularly with at least 30 minutes of continuous activity daily and maintain ideal body weight. 2. HTN: Advised to continue current treatment regimen.  Today's BP 112/63.  Advised to continue to monitor at home along with continued follow-up with PCP for management 3. HLD: Advised to continue current treatment regimen along with continued follow-up with PCP for future prescribing and monitoring of lipid panel 4. DMII: Advised to continue to monitor glucose levels at home along with continued follow-up with PCP for management and monitoring    Follow up in 6 months or call earlier if needed   Greater than 50% of time during this 25 minute visit was spent on counseling, explanation of diagnosis of TIA, reviewing risk factor management of HTN, HLD and DM, planning of further management along with potential future management, and discussion with patient and family answering all questions.    Venancio Poisson, AGNP-BC  [facility] [address] [place], [place] [address]  Phone [phone] Fax [phone] Note: This document was prepared with digital dictation and possible smart phrase technology. Any transcriptional errors that result from this process are unintentional.

## 2018-09-13 NOTE — Patient Instructions (Signed)
Continue clopidogrel 75 mg daily  and lipitor  for secondary stroke prevention  Stop use of aspirin 81mg  at this time  Continue to follow up with PCP regarding cholesterol, blood pressure and diabetes management   Continue to monitor blood pressure at home  Maintain strict control of hypertension with blood pressure goal below 130/90, diabetes with hemoglobin A1c goal below 6.5% and cholesterol with LDL cholesterol (bad cholesterol) goal below 70 mg/dL. I also advised the patient to eat a healthy diet with plenty of whole grains, cereals, fruits and vegetables, exercise regularly and maintain ideal body weight.  Followup in the future with me in 6 months or call earlier if needed       Thank you for coming to see [place] at [facility]. I hope we have been able to provide you high quality care today.  You may receive a patient satisfaction survey over the next few weeks. We would appreciate your feedback and comments so that we may continue to improve ourselves and the health of our patients.

## 2018-09-24 ENCOUNTER — Telehealth: Payer: Self-pay | Admitting: *Deleted

## 2018-09-24 NOTE — Telephone Encounter (Signed)
Pt called need his medication list updated, pt referring doctor request that the aspirin 81 be added back  to pt med list.

## 2018-09-24 NOTE — Telephone Encounter (Signed)
Med list updated. Aspirin added back per pt and referring MD.

## 2018-10-11 DIAGNOSIS — E1151 Type 2 diabetes mellitus with diabetic peripheral angiopathy without gangrene: Secondary | ICD-10-CM | POA: Diagnosis not present

## 2018-10-11 DIAGNOSIS — I10 Essential (primary) hypertension: Secondary | ICD-10-CM | POA: Diagnosis not present

## 2018-10-11 DIAGNOSIS — Z794 Long term (current) use of insulin: Secondary | ICD-10-CM | POA: Diagnosis not present

## 2018-11-16 DIAGNOSIS — I10 Essential (primary) hypertension: Secondary | ICD-10-CM | POA: Diagnosis not present

## 2018-11-16 DIAGNOSIS — R82998 Other abnormal findings in urine: Secondary | ICD-10-CM | POA: Diagnosis not present

## 2018-11-16 DIAGNOSIS — E1151 Type 2 diabetes mellitus with diabetic peripheral angiopathy without gangrene: Secondary | ICD-10-CM | POA: Diagnosis not present

## 2018-11-16 DIAGNOSIS — E7849 Other hyperlipidemia: Secondary | ICD-10-CM | POA: Diagnosis not present

## 2018-11-29 DIAGNOSIS — Z794 Long term (current) use of insulin: Secondary | ICD-10-CM | POA: Diagnosis not present

## 2018-11-29 DIAGNOSIS — E1151 Type 2 diabetes mellitus with diabetic peripheral angiopathy without gangrene: Secondary | ICD-10-CM | POA: Diagnosis not present

## 2018-11-29 DIAGNOSIS — I10 Essential (primary) hypertension: Secondary | ICD-10-CM | POA: Diagnosis not present

## 2018-12-28 DIAGNOSIS — H401111 Primary open-angle glaucoma, right eye, mild stage: Secondary | ICD-10-CM | POA: Diagnosis not present

## 2019-02-18 DIAGNOSIS — R2232 Localized swelling, mass and lump, left upper limb: Secondary | ICD-10-CM | POA: Diagnosis not present

## 2019-02-18 DIAGNOSIS — M62542 Muscle wasting and atrophy, not elsewhere classified, left hand: Secondary | ICD-10-CM | POA: Insufficient documentation

## 2019-02-18 DIAGNOSIS — M65341 Trigger finger, right ring finger: Secondary | ICD-10-CM | POA: Diagnosis not present

## 2019-02-18 DIAGNOSIS — M65331 Trigger finger, right middle finger: Secondary | ICD-10-CM | POA: Diagnosis not present

## 2019-02-19 ENCOUNTER — Other Ambulatory Visit: Payer: Self-pay | Admitting: Orthopedic Surgery

## 2019-02-19 DIAGNOSIS — R2232 Localized swelling, mass and lump, left upper limb: Secondary | ICD-10-CM

## 2019-02-22 ENCOUNTER — Ambulatory Visit
Admission: RE | Admit: 2019-02-22 | Discharge: 2019-02-22 | Disposition: A | Discharge status: Home / Self Care | Payer: Medicare Other | Source: Ambulatory Visit | Attending: Orthopedic Surgery | Admitting: Orthopedic Surgery

## 2019-02-22 DIAGNOSIS — R2232 Localized swelling, mass and lump, left upper limb: Secondary | ICD-10-CM | POA: Diagnosis not present

## 2019-03-06 DIAGNOSIS — G6289 Other specified polyneuropathies: Secondary | ICD-10-CM | POA: Diagnosis not present

## 2019-03-06 DIAGNOSIS — R2232 Localized swelling, mass and lump, left upper limb: Secondary | ICD-10-CM | POA: Diagnosis not present

## 2019-03-06 DIAGNOSIS — M62542 Muscle wasting and atrophy, not elsewhere classified, left hand: Secondary | ICD-10-CM | POA: Diagnosis not present

## 2019-03-06 DIAGNOSIS — G5602 Carpal tunnel syndrome, left upper limb: Secondary | ICD-10-CM | POA: Diagnosis not present

## 2019-03-07 ENCOUNTER — Other Ambulatory Visit: Payer: Self-pay | Admitting: Orthopedic Surgery

## 2019-03-15 ENCOUNTER — Ambulatory Visit: Payer: Medicare Other | Admitting: Adult Health

## 2019-03-18 DIAGNOSIS — G5602 Carpal tunnel syndrome, left upper limb: Secondary | ICD-10-CM | POA: Diagnosis not present

## 2019-03-18 DIAGNOSIS — G5622 Lesion of ulnar nerve, left upper limb: Secondary | ICD-10-CM | POA: Diagnosis not present

## 2019-03-26 DIAGNOSIS — E1151 Type 2 diabetes mellitus with diabetic peripheral angiopathy without gangrene: Secondary | ICD-10-CM | POA: Diagnosis not present

## 2019-03-26 DIAGNOSIS — Z794 Long term (current) use of insulin: Secondary | ICD-10-CM | POA: Diagnosis not present

## 2019-03-26 DIAGNOSIS — E11319 Type 2 diabetes mellitus with unspecified diabetic retinopathy without macular edema: Secondary | ICD-10-CM | POA: Diagnosis not present

## 2019-03-26 DIAGNOSIS — G4733 Obstructive sleep apnea (adult) (pediatric): Secondary | ICD-10-CM | POA: Diagnosis not present

## 2019-03-28 DIAGNOSIS — Z23 Encounter for immunization: Secondary | ICD-10-CM | POA: Diagnosis not present

## 2019-03-28 DIAGNOSIS — E1151 Type 2 diabetes mellitus with diabetic peripheral angiopathy without gangrene: Secondary | ICD-10-CM | POA: Diagnosis not present

## 2019-04-01 ENCOUNTER — Encounter: Payer: Self-pay | Admitting: Podiatry

## 2019-04-01 ENCOUNTER — Other Ambulatory Visit: Payer: Self-pay

## 2019-04-01 ENCOUNTER — Ambulatory Visit: Payer: Medicare Other | Admitting: Podiatry

## 2019-04-01 DIAGNOSIS — L600 Ingrowing nail: Secondary | ICD-10-CM

## 2019-04-01 DIAGNOSIS — M21372 Foot drop, left foot: Secondary | ICD-10-CM

## 2019-04-01 NOTE — Progress Notes (Signed)
Subjective:   Patient ID: Derrick Wilson, male   DOB: 85 y.o.   MRN: [id number]   HPI Patient presents stating the left hallux nail is loose and he is due to have carpal tunnel surgery and also he is experiencing foot drop and states that that is what 1 of the reasons why the big toe did what it did   ROS      Objective:  Physical Exam  Neurovascular status unchanged with patient having complete loss of function anterior tibial tendon and extensor tendon function.  Patient is found to have a damaged left hallux nail that is loose with bleeding underneath the nail bed and dystrophic changes with pain     Assessment:  Foot drop deformity left probably due to back injury along with damage left hallux nail that is loose with low-grade infection     Plan:  H&P conditions reviewed and I anesthetized the left hallux 60 mg like Marcaine mixture sterile prep applied removed hallux nail and flushed out the bed with no active drainage or proximal edema erythema drainage noted.  I then discussed foot drop and we will consider AFO bracing due to his abnormal gait and instability issues organ to wait till after he has his surgery done

## 2019-04-01 NOTE — Patient Instructions (Signed)

## 2019-04-09 ENCOUNTER — Encounter (HOSPITAL_BASED_OUTPATIENT_CLINIC_OR_DEPARTMENT_OTHER): Payer: Self-pay | Admitting: *Deleted

## 2019-04-09 ENCOUNTER — Other Ambulatory Visit: Payer: Self-pay

## 2019-04-10 ENCOUNTER — Other Ambulatory Visit: Payer: Self-pay | Admitting: Orthopedic Surgery

## 2019-04-12 ENCOUNTER — Other Ambulatory Visit: Payer: Self-pay

## 2019-04-12 ENCOUNTER — Encounter (HOSPITAL_BASED_OUTPATIENT_CLINIC_OR_DEPARTMENT_OTHER)
Admission: RE | Admit: 2019-04-12 | Discharge: 2019-04-12 | Disposition: A | Discharge status: Home / Self Care | Payer: Medicare Other | Source: Ambulatory Visit | Attending: Orthopedic Surgery | Admitting: Orthopedic Surgery

## 2019-04-12 ENCOUNTER — Other Ambulatory Visit (HOSPITAL_COMMUNITY)
Admission: RE | Admit: 2019-04-12 | Discharge: 2019-04-12 | Disposition: A | Discharge status: Home / Self Care | Payer: Medicare Other | Source: Ambulatory Visit | Attending: Orthopedic Surgery | Admitting: Orthopedic Surgery

## 2019-04-12 DIAGNOSIS — Z8673 Personal history of transient ischemic attack (TIA), and cerebral infarction without residual deficits: Secondary | ICD-10-CM | POA: Diagnosis not present

## 2019-04-12 DIAGNOSIS — E1142 Type 2 diabetes mellitus with diabetic polyneuropathy: Secondary | ICD-10-CM | POA: Diagnosis not present

## 2019-04-12 DIAGNOSIS — H409 Unspecified glaucoma: Secondary | ICD-10-CM | POA: Diagnosis not present

## 2019-04-12 DIAGNOSIS — M1990 Unspecified osteoarthritis, unspecified site: Secondary | ICD-10-CM | POA: Diagnosis not present

## 2019-04-12 DIAGNOSIS — Z01818 Encounter for other preprocedural examination: Secondary | ICD-10-CM | POA: Insufficient documentation

## 2019-04-12 DIAGNOSIS — G5622 Lesion of ulnar nerve, left upper limb: Secondary | ICD-10-CM | POA: Insufficient documentation

## 2019-04-12 DIAGNOSIS — E785 Hyperlipidemia, unspecified: Secondary | ICD-10-CM | POA: Diagnosis not present

## 2019-04-12 DIAGNOSIS — I4510 Unspecified right bundle-branch block: Secondary | ICD-10-CM | POA: Insufficient documentation

## 2019-04-12 DIAGNOSIS — Z833 Family history of diabetes mellitus: Secondary | ICD-10-CM | POA: Diagnosis not present

## 2019-04-12 DIAGNOSIS — Z794 Long term (current) use of insulin: Secondary | ICD-10-CM | POA: Diagnosis not present

## 2019-04-12 DIAGNOSIS — R9431 Abnormal electrocardiogram [ECG] [EKG]: Secondary | ICD-10-CM | POA: Diagnosis not present

## 2019-04-12 DIAGNOSIS — Z7902 Long term (current) use of antithrombotics/antiplatelets: Secondary | ICD-10-CM | POA: Diagnosis not present

## 2019-04-12 DIAGNOSIS — G5602 Carpal tunnel syndrome, left upper limb: Secondary | ICD-10-CM | POA: Insufficient documentation

## 2019-04-12 DIAGNOSIS — E1151 Type 2 diabetes mellitus with diabetic peripheral angiopathy without gangrene: Secondary | ICD-10-CM | POA: Diagnosis not present

## 2019-04-12 DIAGNOSIS — I10 Essential (primary) hypertension: Secondary | ICD-10-CM | POA: Diagnosis not present

## 2019-04-12 DIAGNOSIS — Z20828 Contact with and (suspected) exposure to other viral communicable diseases: Secondary | ICD-10-CM | POA: Insufficient documentation

## 2019-04-12 LAB — BASIC METABOLIC PANEL
Anion gap: 12 (ref 5–15)
BUN: 21 mg/dL (ref 8–23)
CO2: 22 mmol/L (ref 22–32)
Calcium: 9.3 mg/dL (ref 8.9–10.3)
Chloride: 106 mmol/L (ref 98–111)
Creatinine, Ser: 0.94 mg/dL (ref 0.61–1.24)
GFR calc Af Amer: >60 mL/min (ref >60)
GFR calc non Af Amer: >60 mL/min (ref >60)
Glucose, Bld: 191 mg/dL — ABNORMAL HIGH (ref 70–99)
Potassium: 4.7 mmol/L (ref 3.5–5.1)
Sodium: 140 mmol/L (ref 135–145)

## 2019-04-12 NOTE — Progress Notes (Signed)
EKG reviewed by Dr. Glennon Mac and will proceed with surgery as scheduled.

## 2019-04-13 LAB — NOVEL CORONAVIRUS, NAA (HOSP ORDER, SEND-OUT TO REF LAB; TAT 18-24 HRS): SARS-CoV-2, NAA: NOT DETECTED

## 2019-04-15 NOTE — Anesthesia Preprocedure Evaluation (Addendum)
Anesthesia Evaluation  Patient identified by MRN, date of birth, ID band Patient awake    Reviewed: Allergy & Precautions, NPO status , Patient's Chart, lab work & pertinent test results  History of Anesthesia Complications Negative for: history of anesthetic complications  Airway Mallampati: II  TM Distance: >3 FB Neck ROM: Full    Dental  (+) Dental Advisory Given   Pulmonary neg pulmonary ROS,    Pulmonary exam normal        Cardiovascular hypertension, Pt. on medications + Peripheral Vascular Disease  Normal cardiovascular exam   '20 TTE - mild LVH. EF 60% to 65%. Grade 1 diastolic dysfunction.  AV sclerosis without stenosis. Trivial MR.    Neuro/Psych TIAnegative psych ROS   GI/Hepatic negative GI ROS, Neg liver ROS,   Endo/Other  diabetes, Type 2, Insulin Dependent, Oral Hypoglycemic Agents Obesity   Renal/GU negative Renal ROS     Musculoskeletal negative musculoskeletal ROS (+)   Abdominal   Peds  Hematology negative hematology ROS (+)   Anesthesia Other Findings   Reproductive/Obstetrics                           Anesthesia Physical Anesthesia Plan  ASA: III  Anesthesia Plan: Regional and MAC   Post-op Pain Management:    Induction: Intravenous  PONV Risk Score and Plan: 1 and Propofol infusion and Treatment may vary due to age or medical condition  Airway Management Planned: Natural Airway and Simple Face Mask  Additional Equipment: None  Intra-op Plan:   Post-operative Plan:   Informed Consent: I have reviewed the patients History and Physical, chart, labs and discussed the procedure including the risks, benefits and alternatives for the proposed anesthesia with the patient or authorized representative who has indicated his/her understanding and acceptance.       Plan Discussed with: CRNA and Anesthesiologist  Anesthesia Plan Comments:         Anesthesia Quick Evaluation

## 2019-04-16 ENCOUNTER — Encounter (HOSPITAL_BASED_OUTPATIENT_CLINIC_OR_DEPARTMENT_OTHER): Payer: Self-pay

## 2019-04-16 ENCOUNTER — Ambulatory Visit (HOSPITAL_BASED_OUTPATIENT_CLINIC_OR_DEPARTMENT_OTHER): Payer: Medicare Other | Admitting: Certified Registered"

## 2019-04-16 ENCOUNTER — Other Ambulatory Visit: Payer: Self-pay

## 2019-04-16 ENCOUNTER — Ambulatory Visit (HOSPITAL_BASED_OUTPATIENT_CLINIC_OR_DEPARTMENT_OTHER)
Admission: RE | Admit: 2019-04-16 | Discharge: 2019-04-16 | Disposition: A | Discharge status: Home / Self Care | Payer: Medicare Other | Attending: Orthopedic Surgery | Admitting: Orthopedic Surgery

## 2019-04-16 ENCOUNTER — Encounter (HOSPITAL_BASED_OUTPATIENT_CLINIC_OR_DEPARTMENT_OTHER)
Admission: RE | Disposition: A | Discharge status: Home / Self Care | Payer: Self-pay | Source: Home / Self Care | Attending: Orthopedic Surgery

## 2019-04-16 DIAGNOSIS — E1142 Type 2 diabetes mellitus with diabetic polyneuropathy: Secondary | ICD-10-CM | POA: Insufficient documentation

## 2019-04-16 DIAGNOSIS — I10 Essential (primary) hypertension: Secondary | ICD-10-CM | POA: Insufficient documentation

## 2019-04-16 DIAGNOSIS — Z7902 Long term (current) use of antithrombotics/antiplatelets: Secondary | ICD-10-CM | POA: Diagnosis not present

## 2019-04-16 DIAGNOSIS — Z794 Long term (current) use of insulin: Secondary | ICD-10-CM | POA: Diagnosis not present

## 2019-04-16 DIAGNOSIS — Z8673 Personal history of transient ischemic attack (TIA), and cerebral infarction without residual deficits: Secondary | ICD-10-CM | POA: Insufficient documentation

## 2019-04-16 DIAGNOSIS — E1151 Type 2 diabetes mellitus with diabetic peripheral angiopathy without gangrene: Secondary | ICD-10-CM | POA: Insufficient documentation

## 2019-04-16 DIAGNOSIS — H409 Unspecified glaucoma: Secondary | ICD-10-CM | POA: Insufficient documentation

## 2019-04-16 DIAGNOSIS — Z833 Family history of diabetes mellitus: Secondary | ICD-10-CM | POA: Diagnosis not present

## 2019-04-16 DIAGNOSIS — G5602 Carpal tunnel syndrome, left upper limb: Secondary | ICD-10-CM | POA: Insufficient documentation

## 2019-04-16 DIAGNOSIS — E785 Hyperlipidemia, unspecified: Secondary | ICD-10-CM | POA: Diagnosis not present

## 2019-04-16 DIAGNOSIS — G5622 Lesion of ulnar nerve, left upper limb: Secondary | ICD-10-CM | POA: Insufficient documentation

## 2019-04-16 DIAGNOSIS — G8918 Other acute postprocedural pain: Secondary | ICD-10-CM | POA: Diagnosis not present

## 2019-04-16 DIAGNOSIS — M1990 Unspecified osteoarthritis, unspecified site: Secondary | ICD-10-CM | POA: Insufficient documentation

## 2019-04-16 DIAGNOSIS — G5601 Carpal tunnel syndrome, right upper limb: Secondary | ICD-10-CM | POA: Diagnosis not present

## 2019-04-16 HISTORY — DX: Essential (primary) hypertension: I10

## 2019-04-16 HISTORY — PX: ULNAR NERVE TRANSPOSITION: SHX2595

## 2019-04-16 HISTORY — PX: CARPAL TUNNEL RELEASE: SHX101

## 2019-04-16 LAB — GLUCOSE, CAPILLARY
Glucose-Capillary: 137 mg/dL — ABNORMAL HIGH (ref 70–99)
Glucose-Capillary: 129 mg/dL — ABNORMAL HIGH (ref 70–99)

## 2019-04-16 SURGERY — CARPAL TUNNEL RELEASE
Anesthesia: Monitor Anesthesia Care | Site: Wrist | Laterality: Left

## 2019-04-16 MED ORDER — PROPOFOL 500 MG/50ML IV EMUL
INTRAVENOUS | Status: DC | PRN
  Administered 2019-04-16: 100 ug/kg/min via INTRAVENOUS

## 2019-04-16 MED ORDER — MIDAZOLAM HCL 2 MG/2ML IJ SOLN
INTRAMUSCULAR | Status: AC
  Filled 2019-04-16: qty 2

## 2019-04-16 MED ORDER — FENTANYL CITRATE (PF) 100 MCG/2ML IJ SOLN
INTRAMUSCULAR | Status: AC
  Filled 2019-04-16: qty 2

## 2019-04-16 MED ORDER — CHLORHEXIDINE GLUCONATE 4 % EX LIQD: 60.0000 mL | Freq: Once | CUTANEOUS | Status: DC

## 2019-04-16 MED ORDER — CEFAZOLIN SODIUM-DEXTROSE 2-4 GM/100ML-% IV SOLN
2.0000 g | INTRAVENOUS | Status: AC
  Administered 2019-04-16: 2 g via INTRAVENOUS

## 2019-04-16 MED ORDER — FENTANYL CITRATE (PF) 100 MCG/2ML IJ SOLN
50.0000 ug | INTRAMUSCULAR | Status: DC | PRN
  Administered 2019-04-16: 25 ug via INTRAVENOUS
  Administered 2019-04-16: 50 ug via INTRAVENOUS

## 2019-04-16 MED ORDER — OXYCODONE HCL 5 MG/5ML PO SOLN: 5.0000 mg | Freq: Once | ORAL | Status: DC | PRN

## 2019-04-16 MED ORDER — MIDAZOLAM HCL 2 MG/2ML IJ SOLN: 1.0000 mg | INTRAMUSCULAR | Status: DC | PRN

## 2019-04-16 MED ORDER — DEXAMETHASONE SODIUM PHOSPHATE 10 MG/ML IJ SOLN
INTRAMUSCULAR | Status: DC | PRN
  Administered 2019-04-16: 4 mg via INTRAVENOUS

## 2019-04-16 MED ORDER — SCOPOLAMINE 1 MG/3DAYS TD PT72: 1.0000 | MEDICATED_PATCH | Freq: Once | TRANSDERMAL | Status: DC

## 2019-04-16 MED ORDER — OXYCODONE HCL 5 MG PO TABS: 5.0000 mg | ORAL_TABLET | Freq: Once | ORAL | Status: DC | PRN

## 2019-04-16 MED ORDER — ONDANSETRON HCL 4 MG/2ML IJ SOLN: 4.0000 mg | Freq: Once | INTRAMUSCULAR | Status: DC | PRN

## 2019-04-16 MED ORDER — CEFAZOLIN SODIUM-DEXTROSE 2-4 GM/100ML-% IV SOLN
INTRAVENOUS | Status: AC
  Filled 2019-04-16: qty 100

## 2019-04-16 MED ORDER — PHENYLEPHRINE HCL (PRESSORS) 10 MG/ML IV SOLN
INTRAVENOUS | Status: DC | PRN
  Administered 2019-04-16: 40 ug via INTRAVENOUS

## 2019-04-16 MED ORDER — LACTATED RINGERS IV SOLN
INTRAVENOUS | Status: DC
  Administered 2019-04-16: 08:00:00 via INTRAVENOUS

## 2019-04-16 MED ORDER — ONDANSETRON HCL 4 MG/2ML IJ SOLN
INTRAMUSCULAR | Status: DC | PRN
  Administered 2019-04-16: 4 mg via INTRAVENOUS

## 2019-04-16 MED ORDER — TRAMADOL HCL 50 MG PO TABS: 50.0000 mg | ORAL_TABLET | Freq: Four times a day (QID) | ORAL | 0 refills | Status: DC | PRN

## 2019-04-16 MED ORDER — FENTANYL CITRATE (PF) 100 MCG/2ML IJ SOLN: 25.0000 ug | INTRAMUSCULAR | Status: DC | PRN

## 2019-04-16 MED ORDER — ROPIVACAINE HCL 7.5 MG/ML IJ SOLN
INTRAMUSCULAR | Status: DC | PRN
  Administered 2019-04-16: 20 mL via PERINEURAL

## 2019-04-16 MED ORDER — PROPOFOL 10 MG/ML IV BOLUS
INTRAVENOUS | Status: DC | PRN
  Administered 2019-04-16: 150 mg via INTRAVENOUS

## 2019-04-16 SURGICAL SUPPLY — 50 items
BENZOIN TINCTURE PRP APPL 2/3 (GAUZE/BANDAGES/DRESSINGS) ×3 IMPLANT
BLADE MINI RND TIP GREEN BEAV (BLADE) ×3 IMPLANT
BLADE SURG 15 STRL LF DISP TIS (BLADE) ×2 IMPLANT
BLADE SURG 15 STRL SS (BLADE) ×1
BNDG COHESIVE 3X5 TAN STRL LF (GAUZE/BANDAGES/DRESSINGS) ×6 IMPLANT
BNDG ESMARK 4X9 LF (GAUZE/BANDAGES/DRESSINGS) ×3 IMPLANT
BNDG GAUZE ELAST 4 BULKY (GAUZE/BANDAGES/DRESSINGS) ×6 IMPLANT
CHLORAPREP W/TINT 26 (MISCELLANEOUS) ×3 IMPLANT
CORD BIPOLAR FORCEPS 12FT (ELECTRODE) ×3 IMPLANT
COVER BACK TABLE REUSABLE LG (DRAPES) ×3 IMPLANT
COVER MAYO STAND REUSABLE (DRAPES) ×3 IMPLANT
COVER WAND RF STERILE (DRAPES) IMPLANT
CUFF TOURN SGL QUICK 18X3 (MISCELLANEOUS) ×3 IMPLANT
CUFF TOURN SGL QUICK 18X4 (TOURNIQUET CUFF) ×3 IMPLANT
DECANTER SPIKE VIAL GLASS SM (MISCELLANEOUS) IMPLANT
DRAPE EXTREMITY T 121X128X90 (DISPOSABLE) ×3 IMPLANT
DRAPE SURG 17X23 STRL (DRAPES) ×3 IMPLANT
DRSG PAD ABDOMINAL 8X10 ST (GAUZE/BANDAGES/DRESSINGS) ×6 IMPLANT
GAUZE 4X4 16PLY RFD (DISPOSABLE) IMPLANT
GAUZE SPONGE 4X4 12PLY STRL (GAUZE/BANDAGES/DRESSINGS) ×3 IMPLANT
GAUZE XEROFORM 1X8 LF (GAUZE/BANDAGES/DRESSINGS) ×3 IMPLANT
GLOVE BIOGEL PI IND STRL 8.5 (GLOVE) ×2 IMPLANT
GLOVE BIOGEL PI INDICATOR 8.5 (GLOVE) ×1
GLOVE SURG ORTHO 8.0 STRL STRW (GLOVE) ×3 IMPLANT
GOWN STRL REUS W/ TWL LRG LVL3 (GOWN DISPOSABLE) ×2 IMPLANT
GOWN STRL REUS W/TWL LRG LVL3 (GOWN DISPOSABLE) ×1
GOWN STRL REUS W/TWL XL LVL3 (GOWN DISPOSABLE) ×3 IMPLANT
LOOP VESSEL MAXI BLUE (MISCELLANEOUS) IMPLANT
NEEDLE PRECISIONGLIDE 27X1.5 (NEEDLE) ×3 IMPLANT
NS IRRIG 1000ML POUR BTL (IV SOLUTION) ×3 IMPLANT
PACK BASIN DAY SURGERY FS (CUSTOM PROCEDURE TRAY) ×3 IMPLANT
PAD CAST 3X4 CTTN HI CHSV (CAST SUPPLIES) IMPLANT
PAD CAST 4YDX4 CTTN HI CHSV (CAST SUPPLIES) ×2 IMPLANT
PADDING CAST COTTON 3X4 STRL (CAST SUPPLIES)
PADDING CAST COTTON 4X4 STRL (CAST SUPPLIES) ×1
SLEEVE SCD COMPRESS KNEE MED (MISCELLANEOUS) ×3 IMPLANT
SLING ARM FOAM STRAP LRG (SOFTGOODS) ×3 IMPLANT
SPLINT PLASTER CAST XFAST 3X15 (CAST SUPPLIES) IMPLANT
SPLINT PLASTER XTRA FASTSET 3X (CAST SUPPLIES)
STOCKINETTE 4X48 STRL (DRAPES) ×3 IMPLANT
STRIP CLOSURE SKIN 1/2X4 (GAUZE/BANDAGES/DRESSINGS) ×3 IMPLANT
SUT ETHILON 4 0 PS 2 18 (SUTURE) ×3 IMPLANT
SUT PROLENE 3 0 PS 2 (SUTURE) ×3 IMPLANT
SUT VIC AB 2-0 SH 27 (SUTURE) ×1
SUT VIC AB 2-0 SH 27XBRD (SUTURE) ×2 IMPLANT
SUT VICRYL 4-0 PS2 18IN ABS (SUTURE) ×3 IMPLANT
SYR BULB 3OZ (MISCELLANEOUS) ×3 IMPLANT
SYR CONTROL 10ML LL (SYRINGE) ×3 IMPLANT
TOWEL GREEN STERILE FF (TOWEL DISPOSABLE) ×6 IMPLANT
UNDERPAD 30X36 HEAVY ABSORB (UNDERPADS AND DIAPERS) ×3 IMPLANT

## 2019-04-16 NOTE — Anesthesia Postprocedure Evaluation (Signed)
Anesthesia Post Note  Patient: Randale Colton Marsee  Procedure(s) Performed: CARPAL TUNNEL RELEASE (Left Wrist) ULNAR NERVE DECOMPRESSION AT CUBITAL TUNNEL (Left Elbow)     Patient location during evaluation: PACU Anesthesia Type: General Level of consciousness: awake and alert Pain management: pain level controlled Vital Signs Assessment: post-procedure vital signs reviewed and stable Respiratory status: spontaneous breathing, nonlabored ventilation and respiratory function stable Cardiovascular status: blood pressure returned to baseline and stable Postop Assessment: no apparent nausea or vomiting Anesthetic complications: no    Last Vitals:  Vitals:   04/16/19 1015 04/16/19 1021  BP: 129/84   Pulse: 70 71  Resp: 20 20  Temp:    SpO2: 99% 98%    Last Pain:  Vitals:   04/16/19 1000  TempSrc:   PainSc: 0-No pain                 Audry Pili

## 2019-04-16 NOTE — Anesthesia Procedure Notes (Signed)
Anesthesia Regional Block: Supraclavicular block   Pre-Anesthetic Checklist: ,, timeout performed, Correct Patient, Correct Site, Correct Laterality, Correct Procedure, Correct Position, site marked, Risks and benefits discussed,  Surgical consent,  Pre-op evaluation,  At surgeon's request and post-op pain management  Laterality: Left  Prep: chloraprep       Needles:  Injection technique: Single-shot  Needle Type: Echogenic Needle     Needle Length: 5cm  Needle Gauge: 21     Additional Needles:   Narrative:  Start time: 04/16/2019 8:18 AM End time: 04/16/2019 8:22 AM Injection made incrementally with aspirations every 5 mL.  Performed by: Personally  Anesthesiologist: Audry Pili, MD  Additional Notes: No pain on injection. No increased resistance to injection. Injection made in 5cc increments. Good needle visualization. Patient tolerated the procedure well.

## 2019-04-16 NOTE — Anesthesia Procedure Notes (Signed)
Procedure Name: LMA Insertion Date/Time: 04/16/2019 8:48 AM Performed by: Lavonia Dana, CRNA Pre-anesthesia Checklist: Patient identified, Emergency Drugs available, Suction available and Patient being monitored Patient Re-evaluated:Patient Re-evaluated prior to induction Oxygen Delivery Method: Circle system utilized Preoxygenation: Pre-oxygenation with 100% oxygen Induction Type: IV induction Ventilation: Mask ventilation without difficulty LMA: LMA inserted LMA Size: 5.0 Number of attempts: 1 Airway Equipment and Method: Bite block Placement Confirmation: positive ETCO2 Tube secured with: Tape Dental Injury: Teeth and Oropharynx as per pre-operative assessment

## 2019-04-16 NOTE — Op Note (Signed)
NAME: Derrick Wilson MEDICAL RECORD NO: [id number] DATE OF BIRTH: 04/10/1934 FACILITY: [facility] LOCATION: [facility] PHYSICIAN: Wynonia Sours, MD   OPERATIVE REPORT   DATE OF PROCEDURE: 04/16/19    PREOPERATIVE DIAGNOSIS:   Carpal tunnel syndrome cubital tunnel syndrome left arm   POSTOPERATIVE DIAGNOSIS:   Same   PROCEDURE:   Decompression median nerve at the wrist decompression ulnar nerve at the elbow left side   SURGEON: Daryll Brod, M.D.   ASSISTANT: Leverne Humbles, [facility]   ANESTHESIA:  General with regional   INTRAVENOUS FLUIDS:  Per anesthesia flow sheet.   ESTIMATED BLOOD LOSS:  Minimal.   COMPLICATIONS:  None.   SPECIMENS:  none   TOURNIQUET TIME:    Total Tourniquet Time Documented: Upper Arm (Left) - 36 minutes Total: Upper Arm (Left) - 36 minutes    DISPOSITION:  Stable to PACU.   INDICATIONS: Patient is an 85 year old male with a history of numbness and tingling of his left hand.  He has atrophy to the thenar intrinsics atrophy to the intrinsics of his hand he is complaining of dense numbness and tingling nerve conductions are positive.  This not responded to conservative treatment he is elected to undergo decompression of the median and ulnar nerve at the wrist and elbow.  Pre-peri-and postoperative course been discussed along with risks and complications.  He is aware that there is no guarantee to the surgery the possibility of infection recurrence injury to arteries nerves tendons incomplete relief symptoms and dystrophy.  He is advised that we are attempting to halt the process giving the nerves the opportunity to get to improve but there is no guarantee that it would be able to do so.  In the preoperative area the patient is seen the extremity marked by both patient and surgeon antibiotic given.  A supraclavicular block was carried out without difficulty in the preoperative area under the direction of the anesthesia department.  OPERATIVE  COURSE: He was brought to the operating room placed in a supine position with the left arm free.  He was prepped using ChloraPrep and a three-minute dry time allowed a timeout was taken to confirm confirming patient procedure.  He continued to have feeling.  A LMA was then provided for anesthetic addition.  The limb was exsanguinated with an Esmarch bandage a tourniquet placed on the arm was inflated to 250 mmHg.  A longitudinal incision was made in the left palm carried down through subcutaneous tissue.  Bleeders were electrocauterized with bipolar.  The palmar fascia was split.  The superficial palmar arch was identified along with the flexor tendon to the ring little finger.  Retractors were placed retracting flexor tendon median nerve radially and ulnar nerve ulnarly the flexor retinaculum was then released on its ulnar border.  Right angle and stool retractor were placed between skin and forearm fascia.  The dissection of the deep tissues was provided with blunt dissection.  The proximal aspect of the flexor retinaculum distal forearm fascia was then released with blunt scissors for approximately 2 to 3 cm proximal to the wrist crease.  The canal was explored.  A dense area compression to the nerve was apparent.  This was very hyperemic.  Motor branch entered in the muscle distally.  No further lesions were identified.  The wound was closed with interrupted 4-0 nylon sutures.  Was done after copious irrigation with saline.  The ulnar nerve at the elbow was attended to next.  A short 4 cm  incision was then made over the medial epicondyle of the left elbow carried down through subcutaneous tissue.  Bleeders were again electrocauterized but with bipolar.  Posterior branches of the medial antebrachial cutaneous nerve of the forearm were look for identified and protected.  The Osborne's fascia was then released on its posterior aspect revealing the ulnar nerve.  The forearm fascia was then dissected free from the  flexor carpi ulnaris muscle belly.  A knee retractor was placed for visualization of the fascia.  Superficial fascia of the FCU you was then released the muscle separated with blunt dissection.  A Kamiah guide for carpal tunnel release was then placed between the ulnar nerve distally and the deep fascia of the flexor carpi ulnaris.  ENT angled scissors were then used to release the deep fascia for approximately 8 to 9 cm distal to the medial epicondyle.  Attention was then directed proximally.  The brachial fascia was then dissected free from the overlying subcutaneous tissue and skin.  A knee retractor was placed.  The brachial fascia was then dissected from the underlying ulnar nerve proximally.  The [facility] guide was then used to protect the nerve ENT scissors were then used to release the proximal fascia for approximately 8 cm proximally.  The nerve was visualized over its entire course found to be intact.  With full flexion of the elbow no subluxation was noted.  The wound was copiously irrigated with saline.  Osborne's fascia was then sutured to the posterior skin flap with interrupted 2-0 Vicryl sutures.  The subcutaneous tissue was closed interrupted 4-0 Vicryl and the skin with interrupted 4-0 nylon sutures.  A sterile compressive dressing to each wound was applied.  Inflation the tourniquet all fingers immediately pink.  He was taken to the recovery room for observation in satisfactory condition.  He will be discharged home to return the hand center [facility] in 1 week on Tylenol ibuprofen for pain with Ultram for breakthrough.   Daryll Brod, MD Electronically signed, 04/16/19

## 2019-04-16 NOTE — Progress Notes (Signed)
Assisted Dr. Brock with left, ultrasound guided, supraclavicular block. Side rails up, monitors on throughout procedure. See vital signs in flow sheet. Tolerated Procedure well. 

## 2019-04-16 NOTE — H&P (Signed)
Derrick Wilson is an 85 y.o. male.   Chief Complaint:numbness leftr handHPI: Derrick Wilson is a 85 yo male complaining of a feeling of stiffness in his wrist especially in the morning left side. States is been going on for the past several months. Recalls no history of injury. He was given Voltaren gel to use. He is on Plavix. He is complaining of a feeling of this and stiffness. He states that he is not as strong as prior. He states nothing makes it better or worse. Feels that there may be a mass on the dorsal aspect. Does have diabetes. Is not complaining of numbness or tingling. Has a history of arthritis no history of thyroid problems or gout. Family history is positive for diabetes otherwise negative. He was referred for ultrasound of the dorsal aspect of his hand and nerve conductions. He has had the ultrasound done by Dr. Zigmund Daniel and this is read out and reviewed as being entirely normal with no masses visible. He has had his nerve conductions done by Dr. Tamsen Roers revealing a peripheral neuropathy severe to the median nerve at the wrist with absent potentials and changes at his elbow left side    Past Medical History:  Diagnosis Date  . Diabetes mellitus   . Edema leg    legs   . Glaucoma   . Hyperlipidemia   . Hypertension   . Stroke [facility]) 07/2018   possible TIA-on Plavix    Past Surgical History:  Procedure Laterality Date  . APPENDECTOMY    . CHOLECYSTECTOMY    . COLONOSCOPY    . ELBOW FRACTURE SURGERY    . TONSILLECTOMY AND ADENOIDECTOMY      Family History  Problem Relation Age of Onset  . Diabetes Father   . Stroke Father    Social History:  reports that he has never smoked. He has never used smokeless tobacco. He reports that he does not drink alcohol or use drugs.  Allergies: No Known Allergies  No medications prior to admission.    No results found for this or any previous visit (from the past 48 hour(s)).  No results found.   Pertinent items are noted in  HPI.  Height 5\' 9"  ([id number] m), weight 99.8 kg.  General appearance: alert, cooperative and appears stated age Head: Normocephalic, without obvious abnormality Neck: no JVD Resp: clear to auscultation bilaterally Cardio: regular rate and rhythm, S1, S2 normal, no murmur, click, rub or gallop GI: soft, non-tender; bowel sounds normal; no masses,  no organomegaly Extremities: lefrt hand numbness Pulses: 2+ and symmetric Skin: Skin color, texture, turgor normal. No rashes or lesions Neurologic: Grossly normal Incision/Wound: na  Assessment/Plan Assessment:  Diagnosis is carpal tunnel cubital tunnel syndrome with peripheral neuropathy secondary to diabetes left arm    Plan: Have discussed that Dr. Zigmund Daniel did not see any masses on the dorsal aspect of his hand. We have not discussed his nerve conductions with him. We have discussed possible decompression of the ulnar nerve median nerve at the elbow and wrist respectively. Is aware that we are trying to stop the process and allow the nerve the possibility of recovery but he is aware that this will probably not change any motor function for him. Pre-peri-and postoperative course been discussed along with risks and complications. He is aware there is no guarantee to the surgery the possibility of infection recurrence injury to arteries nerves tendons complete relief symptoms dystrophy. He would like to proceed and he is scheduled for carpal tunnel release  left hand with cubital tunnel decompression possible transposition left elbow as an outpatient under regional anesthesia. Questions are encouraged and answered to his satisfaction.    Derrick Wilson 04/16/2019, 6:22 AM

## 2019-04-16 NOTE — Brief Op Note (Signed)
04/16/2019  9:27 AM  PATIENT:  Derrick Wilson  85 y.o. male  PRE-OPERATIVE DIAGNOSIS:  LEFT CARPAL TUNNEL SYNDROME, LEFT CUBITAL TUNNEL SYNDROME  POST-OPERATIVE DIAGNOSIS:  LEFT CARPAL TUNNEL SYNDROME, LEFT CUBITAL TUNNEL SYNDROME  PROCEDURE:  Procedure(s) with comments: CARPAL TUNNEL RELEASE (Left) - AXILLARY BLOCK ANESTHESIA ULNAR NERVE DECOMPRESSION/ POSSIBLE TRANSPOSITION (Left) - AXILLARY BLOCK ANESTHESIA  SURGEON:  Surgeon(s) and Role:    Daryll Brod, MD - Primary  PHYSICIAN ASSISTANT:   ASSISTANTS: R Dasnoit,PAC   ANESTHESIA:   regional and IV sedation  EBL:  47mlBLOOD ADMINISTERED:none  DRAINS: none   LOCAL MEDICATIONS USED:  NONE  SPECIMEN:  No Specimen  DISPOSITION OF SPECIMEN:  N/A  COUNTS:  YES  TOURNIQUET:  * Missing tourniquet times found for documented tourniquets in log: [id number] *  DICTATION: .Dragon Dictation  PLAN OF CARE: Discharge to home after PACU  PATIENT DISPOSITION:  PACU - hemodynamically stable.

## 2019-04-16 NOTE — Transfer of Care (Signed)
Immediate Anesthesia Transfer of Care Note  Patient: Derrick Wilson  Procedure(s) Performed: CARPAL TUNNEL RELEASE (Left Wrist) ULNAR NERVE DECOMPRESSION AT CUBITAL TUNNEL (Left Elbow)  Patient Location: PACU  Anesthesia Type:General and Regional  Level of Consciousness: drowsy  Airway & Oxygen Therapy: Patient Spontanous Breathing and Patient connected to face mask oxygen  Post-op Assessment: Report given to RN and Post -op Vital signs reviewed and stable  Post vital signs: Reviewed and stable  Last Vitals:  Vitals Value Taken Time  BP 111/59 04/16/19 0940  Temp    Pulse 71 04/16/19 0942  Resp 15 04/16/19 0942  SpO2 99 % 04/16/19 0942  Vitals shown include unvalidated device data.  Last Pain:  Vitals:   04/16/19 0739  TempSrc: Oral  PainSc: 0-No pain         Complications: No apparent anesthesia complications

## 2019-04-16 NOTE — Discharge Instructions (Addendum)
Hand Center Instructions °Hand Surgery ° °Wound Care: °Keep your hand elevated above the level of your heart.  Do not allow it to dangle by your side.  Keep the dressing dry and do not remove it unless your doctor advises you to do so.  He will usually change it at the time of your post-op visit.  Moving your fingers is advised to stimulate circulation but will depend on the site of your surgery.  If you have a splint applied, your doctor will advise you regarding movement. ° °Activity: °Do not drive or operate machinery today.  Rest today and then you may return to your normal activity and work as indicated by your physician. ° °Diet:  °Drink liquids today or eat a light diet.  You may resume a regular diet tomorrow.   ° °General expectations: °Pain for two to three days. °Fingers may become slightly swollen. ° °Call your doctor if any of the following occur: °Severe pain not relieved by pain medication. °Elevated temperature. °Dressing soaked with blood. °Inability to move fingers. °White or bluish color to fingers. ° ° ° ° °Post Anesthesia Home Care Instructions ° °Activity: °Get plenty of rest for the remainder of the day. A responsible individual must stay with you for 24 hours following the procedure.  °For the next 24 hours, DO NOT: °-Drive a car °-Operate machinery °-Drink alcoholic beverages °-Take any medication unless instructed by your physician °-Make any legal decisions or sign important papers. ° °Meals: °Start with liquid foods such as gelatin or soup. Progress to regular foods as tolerated. Avoid greasy, spicy, heavy foods. If nausea and/or vomiting occur, drink only clear liquids until the nausea and/or vomiting subsides. Call your physician if vomiting continues. ° °Special Instructions/Symptoms: °Your throat may feel dry or sore from the anesthesia or the breathing tube placed in your throat during surgery. If this causes discomfort, gargle with warm salt water. The discomfort should disappear  within 24 hours. ° °If you had a scopolamine patch placed behind your ear for the management of post- operative nausea and/or vomiting: ° °1. The medication in the patch is effective for 72 hours, after which it should be removed.  Wrap patch in a tissue and discard in the trash. Wash hands thoroughly with soap and water. °2. You may remove the patch earlier than 72 hours if you experience unpleasant side effects which may include dry mouth, dizziness or visual disturbances. °3. Avoid touching the patch. Wash your hands with soap and water after contact with the patch. °   °Regional Anesthesia Blocks ° °1. Numbness or the inability to move the "blocked" extremity may last from 3-48 hours after placement. The length of time depends on the medication injected and your individual response to the medication. If the numbness is not going away after 48 hours, call your surgeon. ° °2. The extremity that is blocked will need to be protected until the numbness is gone and the  Strength has returned. Because you cannot feel it, you will need to take extra care to avoid injury. Because it may be weak, you may have difficulty moving it or using it. You may not know what position it is in without looking at it while the block is in effect. ° °3. For blocks in the legs and feet, returning to weight bearing and walking needs to be done carefully. You will need to wait until the numbness is entirely gone and the strength has returned. You should be able to move   your leg and foot normally before you try and bear weight or walk. You will need someone to be with you when you first try to ensure you do not fall and possibly risk injury. ° °4. Bruising and tenderness at the needle site are common side effects and will resolve in a few days. ° °5. Persistent numbness or new problems with movement should be communicated to the surgeon or the [facility] ([phone])/ [facility] ([phone]).Call your surgeon  if you experience:  ° °1.  Fever over 101.0. °2.  Inability to urinate. °3.  Nausea and/or vomiting. °4.  Extreme swelling or bruising at the surgical site. °5.  Continued bleeding from the incision. °6.  Increased pain, redness or drainage from the incision. °7.  Problems related to your pain medication. °8.  Any problems and/or concerns °

## 2019-04-17 ENCOUNTER — Encounter (HOSPITAL_BASED_OUTPATIENT_CLINIC_OR_DEPARTMENT_OTHER): Payer: Self-pay | Admitting: Orthopedic Surgery

## 2019-06-03 ENCOUNTER — Ambulatory Visit: Payer: Medicare Other | Admitting: Podiatry

## 2019-06-03 ENCOUNTER — Encounter: Payer: Self-pay | Admitting: Podiatry

## 2019-06-03 ENCOUNTER — Other Ambulatory Visit: Payer: Self-pay

## 2019-06-03 DIAGNOSIS — M79675 Pain in left toe(s): Secondary | ICD-10-CM | POA: Diagnosis not present

## 2019-06-03 DIAGNOSIS — B351 Tinea unguium: Secondary | ICD-10-CM

## 2019-06-03 DIAGNOSIS — M79674 Pain in right toe(s): Secondary | ICD-10-CM

## 2019-06-03 NOTE — Progress Notes (Signed)
Subjective:   Patient ID: Derrick Wilson, male   DOB: 85 y.o.   MRN: [id number]   HPI Patient presents stating his nails have gotten thickened and they are hard for him to cut and they do get painful and he has diabetes and has a relatively high A1c   ROS      Objective:  Physical Exam  Neurovascular status intact with thick yellow brittle nailbeds 1-5 both feet that are moderately painful     Assessment:  Ptotic nail infection with pain 1-5 both feet     Plan:  Debride painful nailbeds 1-5 both feet with no iatrogenic bleeding noted

## 2019-06-12 IMAGING — MR MR HEAD W/O CM
10 of 11 series · 43 of 48 positions shown · non-contrast
Comparison: 07/29/2018 CT head and CTA head.

CLINICAL DATA: 84 y/o  M; sudden onset of left leg weakness.

EXAM:
MRI HEAD WITHOUT CONTRAST
TECHNIQUE: Multiplanar, multiecho pulse sequences of the brain and surrounding
structures were obtained without intravenous contrast.

[Series 5: DWI · axial · 3.0mm · 0.88mm/px · z∈[-54,+81]mm · 9 of 92 slices shown (1 of 4)]
[im 1/92]
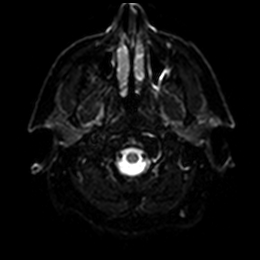
[im 12/92]
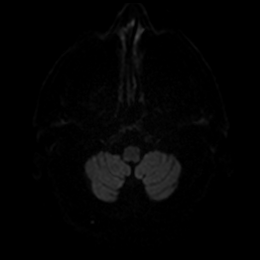
[im 23/92]
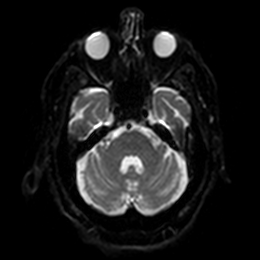
[im 35/92]
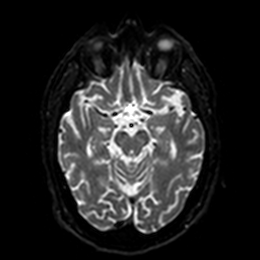
[im 46/92]
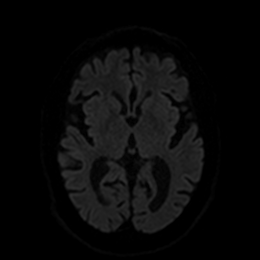
[im 57/92]
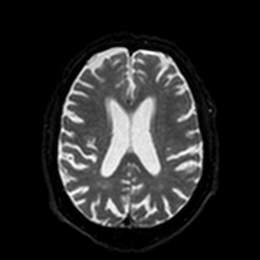
[im 69/92]
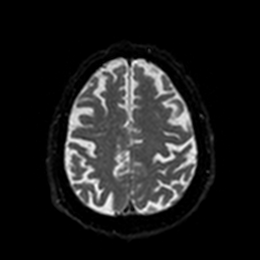
[im 80/92]
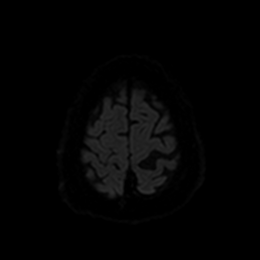
[im 92/92]
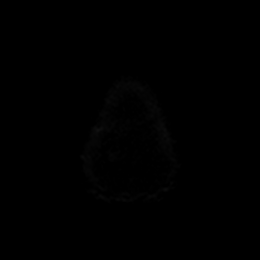

[Series 6: DWI · axial · 3.0mm · 0.88mm/px · z∈[-54,+81]mm · 5 of 46 slices shown (2 of 4)]
[im 1/46]
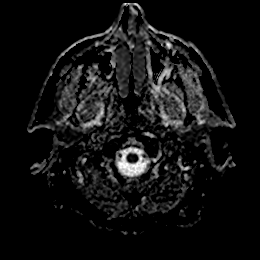
[im 12/46]
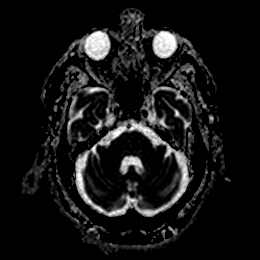
[im 23/46]
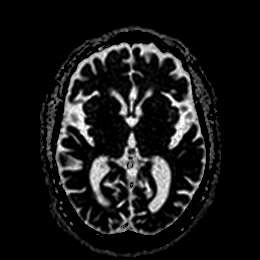
[im 34/46]
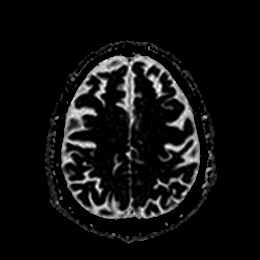
[im 46/46]
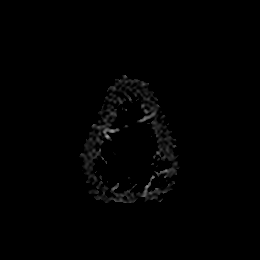

[Series 7: DWI · coronal · 4.0mm · 0.88mm/px · 7 of 72 slices shown (3 of 4)]
[im 1/72]
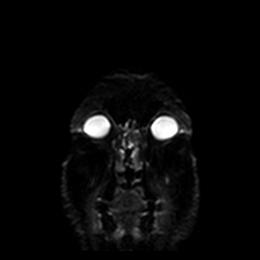
[im 12/72]
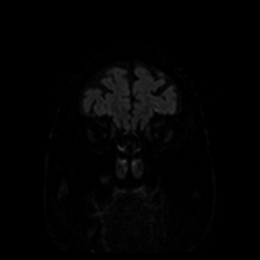
[im 24/72]
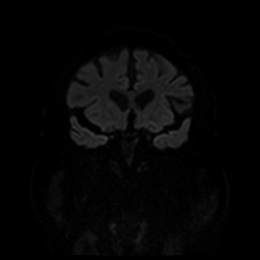
[im 36/72]
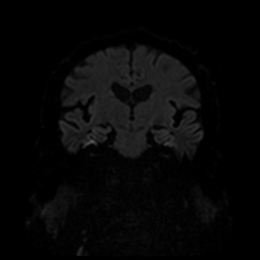
[im 48/72]
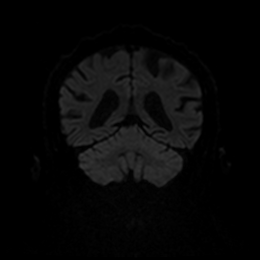
[im 60/72]
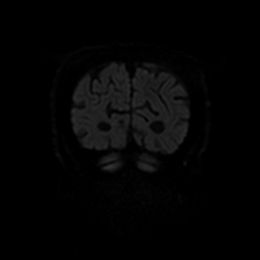
[im 72/72]
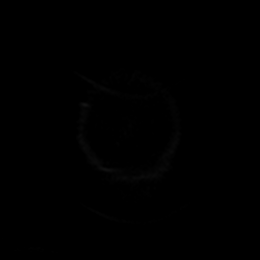

[Series 8: DWI · coronal · 4.0mm · 0.88mm/px · 3 of 36 slices shown (4 of 4)]
[im 1/36]
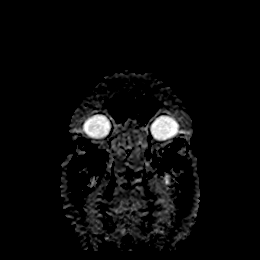
[im 18/36]
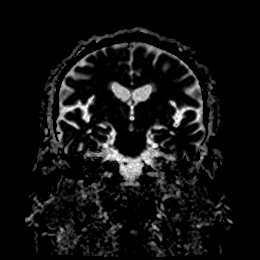
[im 36/36]
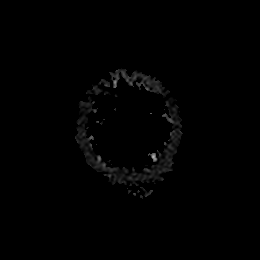

[Series 9: T1 · sagittal · 5.0mm · 0.75mm/px · 2 of 23 slices shown]
[im 1/23]
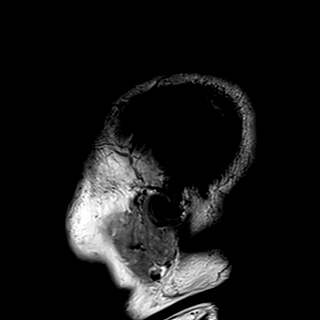
[im 23/23]
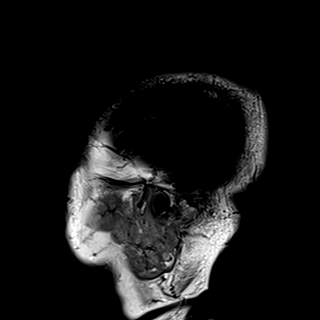

[Series 10: T2 · axial · 5.0mm · 0.72mm/px · z∈[-68,+82]mm · 2 of 26 slices shown (1 of 2)]
[im 1/26]
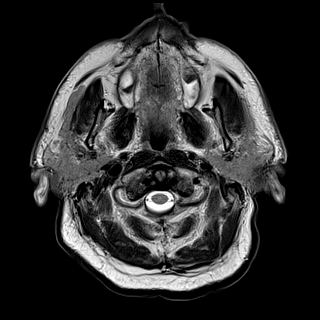
[im 26/26]
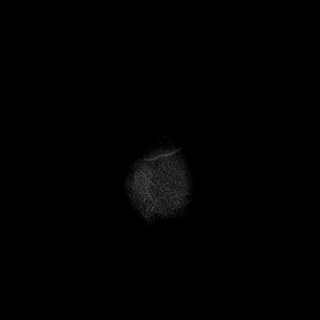

[Series 11: FLAIR · axial · 5.0mm · 0.45mm/px · z∈[-68,+82]mm · 2 of 26 slices shown]
[im 1/26]
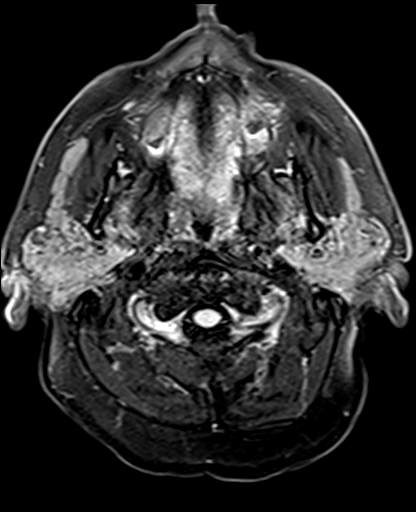
[im 26/26]
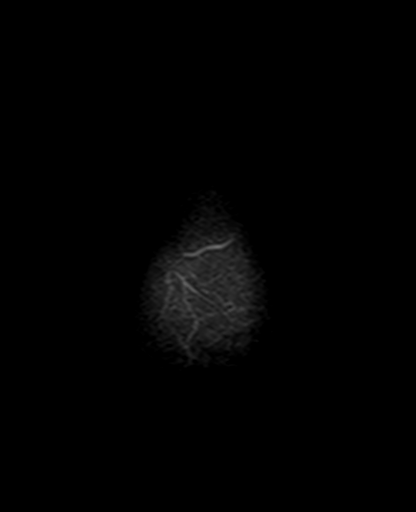

[Series 12: swi_images · axial · 3.0mm · 0.90mm/px · z∈[-81,+96]mm · 5 of 60 slices shown]
[im 1/60]
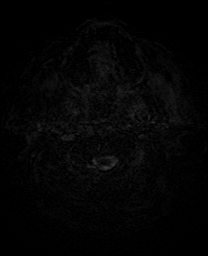
[im 15/60]
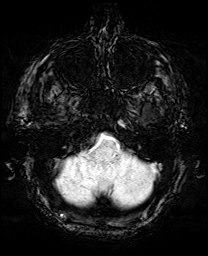
[im 30/60]
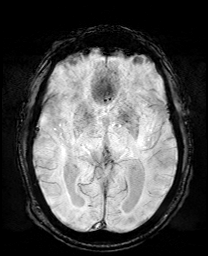
[im 45/60]
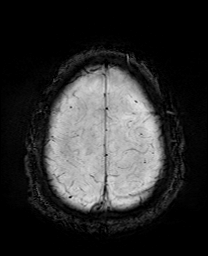
[im 60/60]
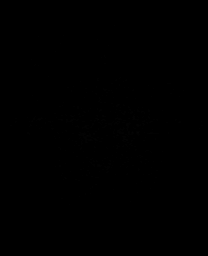

[Series 13: mip_images(sw) · axial · 24.0mm · 0.90mm/px · z∈[-71,+85]mm · 5 of 53 slices shown]
[im 1/53]
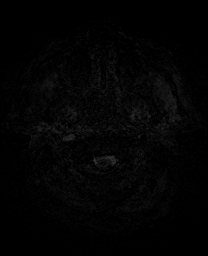
[im 14/53]
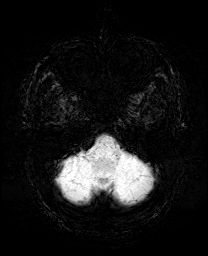
[im 27/53]
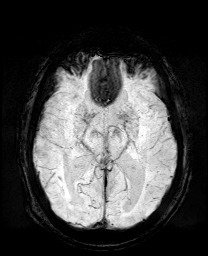
[im 40/53]
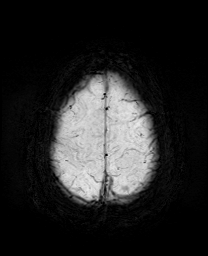
[im 53/53]
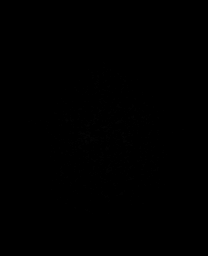

[Series 15: T2 · coronal · 5.0mm · 0.34mm/px · 3 of 30 slices shown (2 of 2)]
[im 1/30]
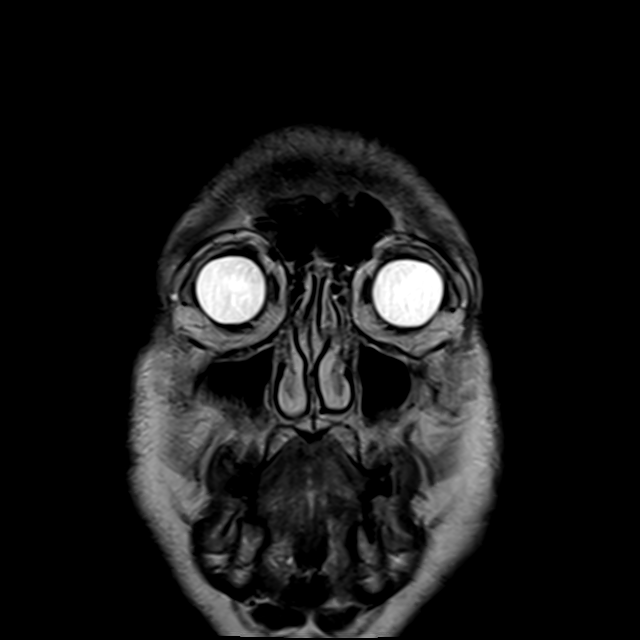
[im 15/30]
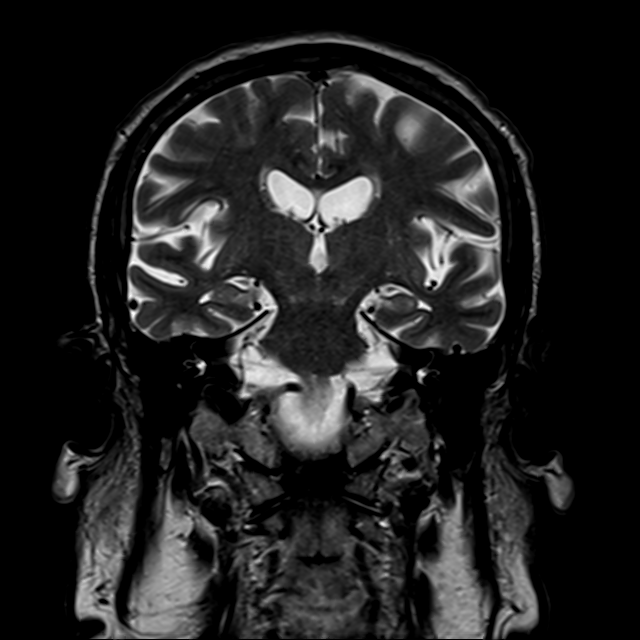
[im 30/30]
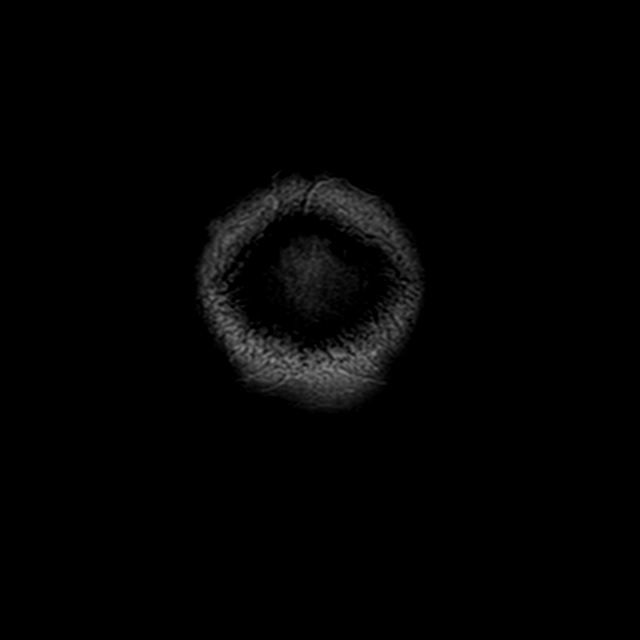

[43 of 48 positions shown; findings below may reference images not displayed]

FINDINGS: Brain: No acute infarction, hemorrhage, hydrocephalus, extra-axial
collection or mass lesion. Few punctate nonspecific T2 FLAIR
hyperintensities in subcortical and periventricular white matter are
compatible with mild chronic microvascular ischemic changes for age.
Moderate volume loss of the brain.

Vascular: Normal flow voids.

Skull and upper cervical spine: Normal marrow signal.

Sinuses/Orbits: Mild maxillary sinus mucosal thickening. Normal
signal of mastoid air cells. Bilateral intra-ocular lens
replacement.

Other: None.
IMPRESSION: 1. No acute intracranial abnormality identified.
2. Mild chronic microvascular ischemic changes and moderate volume
loss of the brain.

## 2019-06-12 IMAGING — CT CT ANGIO NECK
2 of 8 series · 9 of 33 positions shown · IV contrast (APPLIED)
Comparison: 07/29/2017 CT head.

CLINICAL DATA: 84 y/o  M; left leg weakness.  Stroke patient.

EXAM:
CT ANGIOGRAPHY HEAD AND NECK
TECHNIQUE: Multidetector CT imaging of the head and neck was performed using
the standard protocol during bolus administration of intravenous
contrast. Multiplanar CT image reconstructions and MIPs were
obtained to evaluate the vascular anatomy. Carotid stenosis
measurements (when applicable) are obtained utilizing NASCET
criteria, using the distal internal carotid diameter as the
denominator.
CONTRAST:  75mL [id number] IOPAMIDOL ([id number]) INJECTION 76%

[Series 5: cta neck/head · axial · 0.52mm/px · z∈[-335,+47]mm · 3 of 192 slices shown]
[im 1/192  soft-tissue]
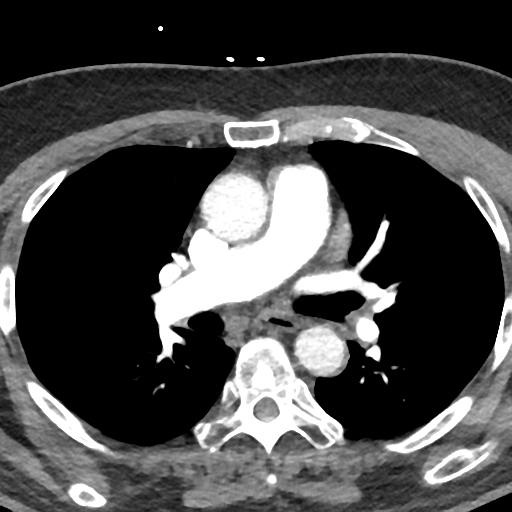
[im 96/192  bone]
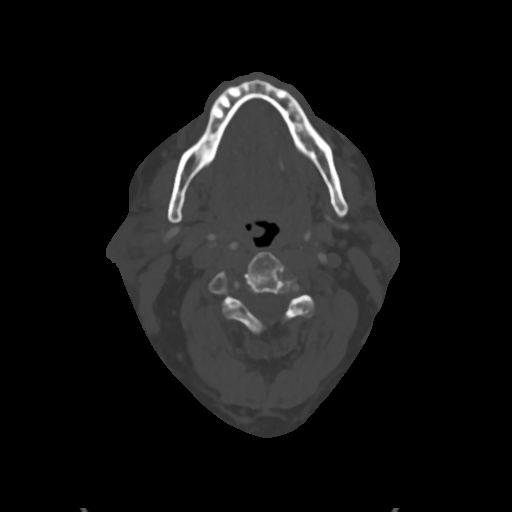
[im 192/192  soft-tissue]
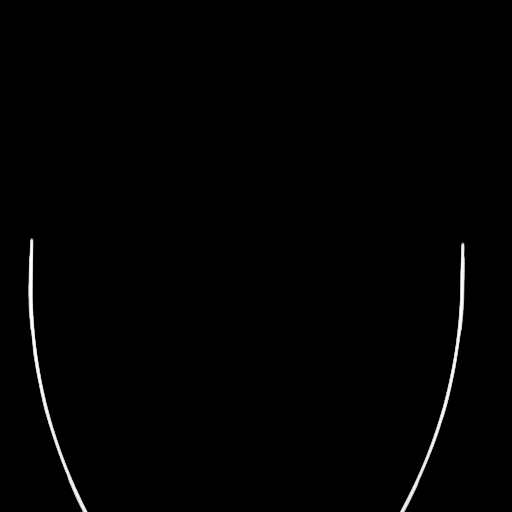

[Series 7: ax thins · axial · 0.39mm/px · z∈[-282,-17]mm · 6 of 373 slices shown]
[im 54/373  soft-tissue]
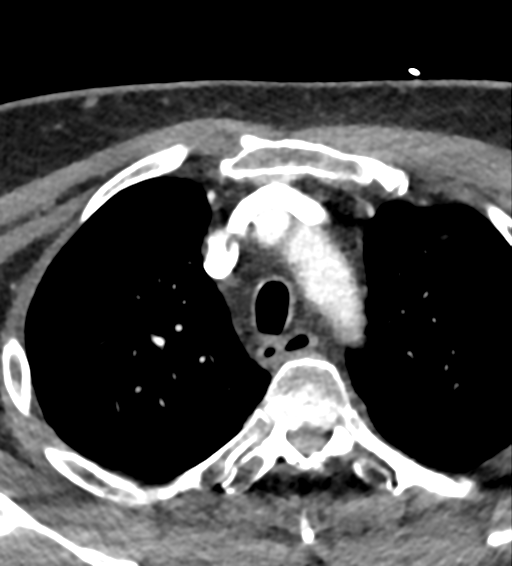
[im 107/373  soft-tissue]
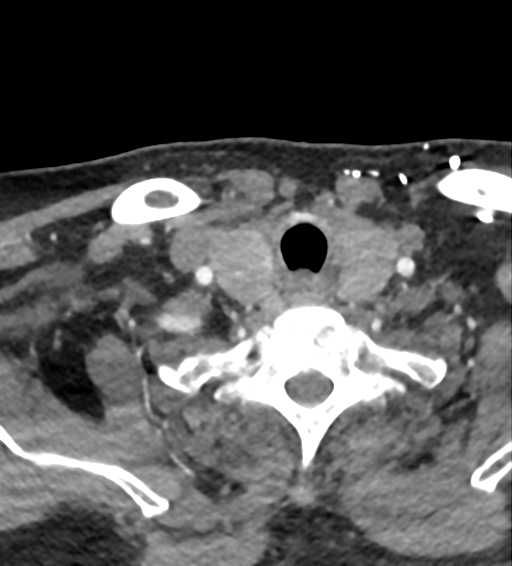
[im 160/373  soft-tissue]
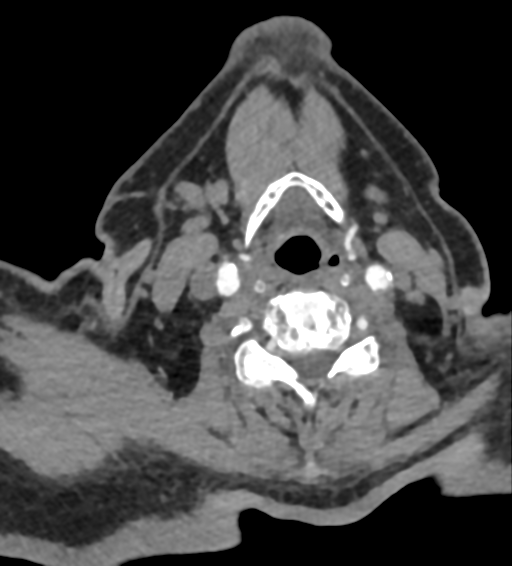
[im 213/373  soft-tissue]
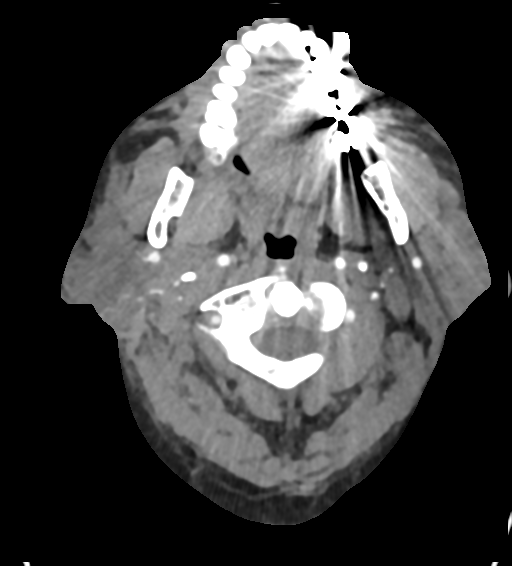
[im 266/373  soft-tissue]
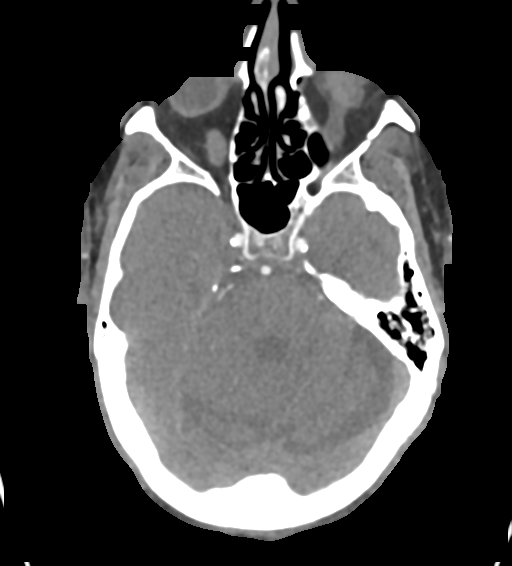
[im 319/373  soft-tissue]
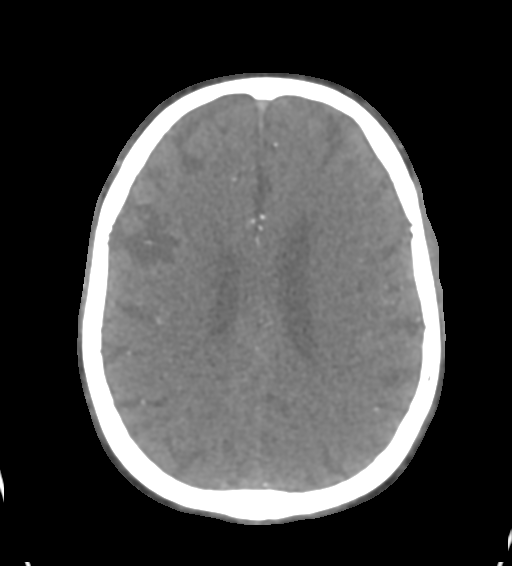

[9 of 33 positions shown; findings below may reference images not displayed]

FINDINGS: CTA NECK FINDINGS

Aortic arch: Standard branching. Imaged portion shows no evidence of
aneurysm or dissection. No significant stenosis of the major arch
vessel origins. Mild calcific atherosclerosis.

Right carotid system: No evidence of dissection, stenosis (50% or
greater) or occlusion. Mild non stenotic calcific atherosclerosis of
the carotid bifurcation.

Left carotid system: No evidence of dissection, stenosis (50% or
greater) or occlusion. Mild non stenotic calcific atherosclerosis of
the carotid bifurcation.

Vertebral arteries: Codominant. No evidence of dissection, stenosis
(50% or greater) or occlusion.

Skeleton: Advanced spondylosis of the cervical spine. C3-C five
vertebral body and facet fusion. Multilevel spinal canal stenosis
greatest at the C5-6 level where it is moderate to severe.

Other neck: Negative.

Upper chest: Negative.

Review of the MIP images confirms the above findings

CTA HEAD FINDINGS

Anterior circulation: No significant stenosis, proximal occlusion,
aneurysm, or vascular malformation. Calcific atherosclerosis of
carotid siphons with segments of mild less than 50% stenosis.

Posterior circulation: No significant stenosis, proximal occlusion,
aneurysm, or vascular malformation. Calcific atherosclerosis of
left-greater-than-right vertebral arteries with mild less than 50%
stenosis.

Venous sinuses: As permitted by contrast timing, patent.

Anatomic variants: None significant.

Delayed phase: No abnormal intracranial enhancement.

Review of the MIP images confirms the above findings
IMPRESSION: 1. Patent carotid and vertebral arteries. No dissection, aneurysm,
or hemodynamically significant stenosis utilizing NASCET criteria.
2. Patent anterior and posterior intracranial circulation. No large
vessel occlusion, aneurysm, or significant stenosis.

These results were called by telephone at the time of interpretation
on 07/29/2018 at [DATE] to Dr. Savage , who verbally
acknowledged these results.

## 2019-06-20 DIAGNOSIS — H401111 Primary open-angle glaucoma, right eye, mild stage: Secondary | ICD-10-CM | POA: Diagnosis not present

## 2019-07-26 DIAGNOSIS — E1151 Type 2 diabetes mellitus with diabetic peripheral angiopathy without gangrene: Secondary | ICD-10-CM | POA: Diagnosis not present

## 2019-07-26 DIAGNOSIS — E785 Hyperlipidemia, unspecified: Secondary | ICD-10-CM | POA: Diagnosis not present

## 2019-07-26 DIAGNOSIS — I739 Peripheral vascular disease, unspecified: Secondary | ICD-10-CM | POA: Diagnosis not present

## 2019-07-26 DIAGNOSIS — G4733 Obstructive sleep apnea (adult) (pediatric): Secondary | ICD-10-CM | POA: Diagnosis not present

## 2019-08-28 DIAGNOSIS — E1151 Type 2 diabetes mellitus with diabetic peripheral angiopathy without gangrene: Secondary | ICD-10-CM | POA: Diagnosis not present

## 2019-09-04 DIAGNOSIS — G5602 Carpal tunnel syndrome, left upper limb: Secondary | ICD-10-CM | POA: Diagnosis not present

## 2019-09-04 DIAGNOSIS — G5622 Lesion of ulnar nerve, left upper limb: Secondary | ICD-10-CM | POA: Diagnosis not present

## 2019-11-21 DIAGNOSIS — Z0000 Encounter for general adult medical examination without abnormal findings: Secondary | ICD-10-CM | POA: Diagnosis not present

## 2019-11-21 DIAGNOSIS — E7849 Other hyperlipidemia: Secondary | ICD-10-CM | POA: Diagnosis not present

## 2019-11-21 DIAGNOSIS — E1151 Type 2 diabetes mellitus with diabetic peripheral angiopathy without gangrene: Secondary | ICD-10-CM | POA: Diagnosis not present

## 2019-11-21 DIAGNOSIS — I10 Essential (primary) hypertension: Secondary | ICD-10-CM | POA: Diagnosis not present

## 2019-11-21 DIAGNOSIS — R82998 Other abnormal findings in urine: Secondary | ICD-10-CM | POA: Diagnosis not present

## 2019-11-22 DIAGNOSIS — Z1212 Encounter for screening for malignant neoplasm of rectum: Secondary | ICD-10-CM | POA: Diagnosis not present

## 2019-11-28 DIAGNOSIS — M21372 Foot drop, left foot: Secondary | ICD-10-CM | POA: Diagnosis not present

## 2019-11-28 DIAGNOSIS — Z0000 Encounter for general adult medical examination without abnormal findings: Secondary | ICD-10-CM | POA: Diagnosis not present

## 2019-11-28 DIAGNOSIS — Z794 Long term (current) use of insulin: Secondary | ICD-10-CM | POA: Diagnosis not present

## 2019-11-28 DIAGNOSIS — R2681 Unsteadiness on feet: Secondary | ICD-10-CM | POA: Diagnosis not present

## 2019-12-02 ENCOUNTER — Ambulatory Visit: Payer: Medicare Other | Admitting: Podiatry

## 2019-12-18 DIAGNOSIS — H401111 Primary open-angle glaucoma, right eye, mild stage: Secondary | ICD-10-CM | POA: Diagnosis not present

## 2020-03-27 DIAGNOSIS — E785 Hyperlipidemia, unspecified: Secondary | ICD-10-CM | POA: Diagnosis not present

## 2020-03-27 DIAGNOSIS — E669 Obesity, unspecified: Secondary | ICD-10-CM | POA: Diagnosis not present

## 2020-03-27 DIAGNOSIS — E1151 Type 2 diabetes mellitus with diabetic peripheral angiopathy without gangrene: Secondary | ICD-10-CM | POA: Diagnosis not present

## 2020-03-27 DIAGNOSIS — L989 Disorder of the skin and subcutaneous tissue, unspecified: Secondary | ICD-10-CM | POA: Diagnosis not present

## 2020-04-16 DIAGNOSIS — L308 Other specified dermatitis: Secondary | ICD-10-CM | POA: Diagnosis not present

## 2020-04-16 DIAGNOSIS — D692 Other nonthrombocytopenic purpura: Secondary | ICD-10-CM | POA: Diagnosis not present

## 2020-04-16 DIAGNOSIS — D2262 Melanocytic nevi of left upper limb, including shoulder: Secondary | ICD-10-CM | POA: Diagnosis not present

## 2020-04-16 DIAGNOSIS — L570 Actinic keratosis: Secondary | ICD-10-CM | POA: Diagnosis not present

## 2020-04-16 DIAGNOSIS — L821 Other seborrheic keratosis: Secondary | ICD-10-CM | POA: Diagnosis not present

## 2020-04-16 DIAGNOSIS — D225 Melanocytic nevi of trunk: Secondary | ICD-10-CM | POA: Diagnosis not present

## 2020-06-17 DIAGNOSIS — S61309A Unspecified open wound of unspecified finger with damage to nail, initial encounter: Secondary | ICD-10-CM | POA: Diagnosis not present

## 2020-06-25 DIAGNOSIS — H401111 Primary open-angle glaucoma, right eye, mild stage: Secondary | ICD-10-CM | POA: Diagnosis not present

## 2020-06-25 DIAGNOSIS — Z961 Presence of intraocular lens: Secondary | ICD-10-CM | POA: Diagnosis not present

## 2020-06-25 DIAGNOSIS — E113293 Type 2 diabetes mellitus with mild nonproliferative diabetic retinopathy without macular edema, bilateral: Secondary | ICD-10-CM | POA: Diagnosis not present

## 2020-07-27 DIAGNOSIS — L309 Dermatitis, unspecified: Secondary | ICD-10-CM | POA: Diagnosis not present

## 2020-07-27 DIAGNOSIS — D692 Other nonthrombocytopenic purpura: Secondary | ICD-10-CM | POA: Diagnosis not present

## 2020-12-04 DIAGNOSIS — Z125 Encounter for screening for malignant neoplasm of prostate: Secondary | ICD-10-CM | POA: Diagnosis not present

## 2020-12-04 DIAGNOSIS — E785 Hyperlipidemia, unspecified: Secondary | ICD-10-CM | POA: Diagnosis not present

## 2020-12-04 DIAGNOSIS — E1151 Type 2 diabetes mellitus with diabetic peripheral angiopathy without gangrene: Secondary | ICD-10-CM | POA: Diagnosis not present

## 2020-12-11 DIAGNOSIS — R82998 Other abnormal findings in urine: Secondary | ICD-10-CM | POA: Diagnosis not present

## 2020-12-11 DIAGNOSIS — I10 Essential (primary) hypertension: Secondary | ICD-10-CM | POA: Diagnosis not present

## 2021-01-04 DIAGNOSIS — H401111 Primary open-angle glaucoma, right eye, mild stage: Secondary | ICD-10-CM | POA: Diagnosis not present

## 2021-06-14 DIAGNOSIS — E1151 Type 2 diabetes mellitus with diabetic peripheral angiopathy without gangrene: Secondary | ICD-10-CM | POA: Diagnosis not present

## 2021-06-14 DIAGNOSIS — E669 Obesity, unspecified: Secondary | ICD-10-CM | POA: Diagnosis not present

## 2021-06-14 DIAGNOSIS — Z794 Long term (current) use of insulin: Secondary | ICD-10-CM | POA: Diagnosis not present

## 2021-06-14 DIAGNOSIS — I739 Peripheral vascular disease, unspecified: Secondary | ICD-10-CM | POA: Diagnosis not present

## 2021-06-23 DIAGNOSIS — E113293 Type 2 diabetes mellitus with mild nonproliferative diabetic retinopathy without macular edema, bilateral: Secondary | ICD-10-CM | POA: Diagnosis not present

## 2021-06-23 DIAGNOSIS — H401111 Primary open-angle glaucoma, right eye, mild stage: Secondary | ICD-10-CM | POA: Diagnosis not present

## 2021-12-10 DIAGNOSIS — I10 Essential (primary) hypertension: Secondary | ICD-10-CM | POA: Diagnosis not present

## 2021-12-10 DIAGNOSIS — E1151 Type 2 diabetes mellitus with diabetic peripheral angiopathy without gangrene: Secondary | ICD-10-CM | POA: Diagnosis not present

## 2021-12-10 DIAGNOSIS — E669 Obesity, unspecified: Secondary | ICD-10-CM | POA: Diagnosis not present

## 2021-12-10 DIAGNOSIS — E785 Hyperlipidemia, unspecified: Secondary | ICD-10-CM | POA: Diagnosis not present

## 2021-12-15 DIAGNOSIS — R82998 Other abnormal findings in urine: Secondary | ICD-10-CM | POA: Diagnosis not present

## 2021-12-20 DIAGNOSIS — Z0000 Encounter for general adult medical examination without abnormal findings: Secondary | ICD-10-CM | POA: Diagnosis not present

## 2021-12-20 DIAGNOSIS — M25561 Pain in right knee: Secondary | ICD-10-CM | POA: Diagnosis not present

## 2021-12-20 DIAGNOSIS — E1151 Type 2 diabetes mellitus with diabetic peripheral angiopathy without gangrene: Secondary | ICD-10-CM | POA: Diagnosis not present

## 2021-12-20 DIAGNOSIS — R2681 Unsteadiness on feet: Secondary | ICD-10-CM | POA: Diagnosis not present

## 2022-01-06 DIAGNOSIS — H401111 Primary open-angle glaucoma, right eye, mild stage: Secondary | ICD-10-CM | POA: Diagnosis not present

## 2022-06-20 DIAGNOSIS — R2681 Unsteadiness on feet: Secondary | ICD-10-CM | POA: Diagnosis not present

## 2022-06-20 DIAGNOSIS — I739 Peripheral vascular disease, unspecified: Secondary | ICD-10-CM | POA: Diagnosis not present

## 2022-06-20 DIAGNOSIS — E1151 Type 2 diabetes mellitus with diabetic peripheral angiopathy without gangrene: Secondary | ICD-10-CM | POA: Diagnosis not present

## 2022-06-20 DIAGNOSIS — I10 Essential (primary) hypertension: Secondary | ICD-10-CM | POA: Diagnosis not present

## 2022-06-24 ENCOUNTER — Emergency Department (HOSPITAL_COMMUNITY): Payer: Medicare Other

## 2022-06-24 ENCOUNTER — Encounter (HOSPITAL_COMMUNITY): Payer: Self-pay

## 2022-06-24 ENCOUNTER — Other Ambulatory Visit: Payer: Self-pay

## 2022-06-24 ENCOUNTER — Inpatient Hospital Stay (HOSPITAL_COMMUNITY)
Admission: EM | Admit: 2022-06-24 | Discharge: 2022-07-07 | DRG: 565 | Disposition: A | Discharge status: Skilled Nursing Facility | Payer: Medicare Other | Attending: Internal Medicine | Admitting: Internal Medicine

## 2022-06-24 DIAGNOSIS — N400 Enlarged prostate without lower urinary tract symptoms: Secondary | ICD-10-CM | POA: Diagnosis not present

## 2022-06-24 DIAGNOSIS — Z7985 Long-term (current) use of injectable non-insulin antidiabetic drugs: Secondary | ICD-10-CM | POA: Diagnosis not present

## 2022-06-24 DIAGNOSIS — M62542 Muscle wasting and atrophy, not elsewhere classified, left hand: Secondary | ICD-10-CM | POA: Diagnosis not present

## 2022-06-24 DIAGNOSIS — Y92019 Unspecified place in single-family (private) house as the place of occurrence of the external cause: Secondary | ICD-10-CM | POA: Diagnosis not present

## 2022-06-24 DIAGNOSIS — R600 Localized edema: Secondary | ICD-10-CM | POA: Diagnosis present

## 2022-06-24 DIAGNOSIS — M6282 Rhabdomyolysis: Secondary | ICD-10-CM | POA: Diagnosis not present

## 2022-06-24 DIAGNOSIS — Z79899 Other long term (current) drug therapy: Secondary | ICD-10-CM

## 2022-06-24 DIAGNOSIS — Z833 Family history of diabetes mellitus: Secondary | ICD-10-CM

## 2022-06-24 DIAGNOSIS — R2689 Other abnormalities of gait and mobility: Secondary | ICD-10-CM | POA: Diagnosis not present

## 2022-06-24 DIAGNOSIS — Z823 Family history of stroke: Secondary | ICD-10-CM | POA: Diagnosis not present

## 2022-06-24 DIAGNOSIS — R296 Repeated falls: Secondary | ICD-10-CM | POA: Insufficient documentation

## 2022-06-24 DIAGNOSIS — M4316 Spondylolisthesis, lumbar region: Secondary | ICD-10-CM | POA: Diagnosis not present

## 2022-06-24 DIAGNOSIS — I6782 Cerebral ischemia: Secondary | ICD-10-CM | POA: Diagnosis not present

## 2022-06-24 DIAGNOSIS — Z7984 Long term (current) use of oral hypoglycemic drugs: Secondary | ICD-10-CM

## 2022-06-24 DIAGNOSIS — R531 Weakness: Secondary | ICD-10-CM | POA: Diagnosis not present

## 2022-06-24 DIAGNOSIS — E119 Type 2 diabetes mellitus without complications: Secondary | ICD-10-CM | POA: Diagnosis not present

## 2022-06-24 DIAGNOSIS — R262 Difficulty in walking, not elsewhere classified: Secondary | ICD-10-CM | POA: Diagnosis not present

## 2022-06-24 DIAGNOSIS — Z794 Long term (current) use of insulin: Secondary | ICD-10-CM

## 2022-06-24 DIAGNOSIS — R2232 Localized swelling, mass and lump, left upper limb: Secondary | ICD-10-CM | POA: Diagnosis not present

## 2022-06-24 DIAGNOSIS — M48061 Spinal stenosis, lumbar region without neurogenic claudication: Secondary | ICD-10-CM | POA: Diagnosis not present

## 2022-06-24 DIAGNOSIS — I2489 Other forms of acute ischemic heart disease: Secondary | ICD-10-CM | POA: Diagnosis present

## 2022-06-24 DIAGNOSIS — W19XXXA Unspecified fall, initial encounter: Secondary | ICD-10-CM | POA: Insufficient documentation

## 2022-06-24 DIAGNOSIS — W010XXA Fall on same level from slipping, tripping and stumbling without subsequent striking against object, initial encounter: Secondary | ICD-10-CM | POA: Diagnosis present

## 2022-06-24 DIAGNOSIS — J9811 Atelectasis: Secondary | ICD-10-CM | POA: Diagnosis not present

## 2022-06-24 DIAGNOSIS — I10 Essential (primary) hypertension: Secondary | ICD-10-CM | POA: Diagnosis present

## 2022-06-24 DIAGNOSIS — G459 Transient cerebral ischemic attack, unspecified: Secondary | ICD-10-CM | POA: Diagnosis not present

## 2022-06-24 DIAGNOSIS — T796XXA Traumatic ischemia of muscle, initial encounter: Secondary | ICD-10-CM | POA: Diagnosis not present

## 2022-06-24 DIAGNOSIS — Z8673 Personal history of transient ischemic attack (TIA), and cerebral infarction without residual deficits: Secondary | ICD-10-CM

## 2022-06-24 DIAGNOSIS — E7800 Pure hypercholesterolemia, unspecified: Secondary | ICD-10-CM | POA: Diagnosis not present

## 2022-06-24 DIAGNOSIS — E1165 Type 2 diabetes mellitus with hyperglycemia: Secondary | ICD-10-CM | POA: Diagnosis not present

## 2022-06-24 DIAGNOSIS — M6281 Muscle weakness (generalized): Secondary | ICD-10-CM | POA: Diagnosis not present

## 2022-06-24 DIAGNOSIS — Z7902 Long term (current) use of antithrombotics/antiplatelets: Secondary | ICD-10-CM | POA: Diagnosis not present

## 2022-06-24 DIAGNOSIS — Z743 Need for continuous supervision: Secondary | ICD-10-CM | POA: Diagnosis not present

## 2022-06-24 DIAGNOSIS — H409 Unspecified glaucoma: Secondary | ICD-10-CM | POA: Diagnosis present

## 2022-06-24 DIAGNOSIS — R278 Other lack of coordination: Secondary | ICD-10-CM | POA: Diagnosis not present

## 2022-06-24 DIAGNOSIS — E785 Hyperlipidemia, unspecified: Secondary | ICD-10-CM | POA: Insufficient documentation

## 2022-06-24 DIAGNOSIS — T796XXD Traumatic ischemia of muscle, subsequent encounter: Secondary | ICD-10-CM | POA: Diagnosis not present

## 2022-06-24 DIAGNOSIS — R2681 Unsteadiness on feet: Secondary | ICD-10-CM | POA: Diagnosis not present

## 2022-06-24 DIAGNOSIS — Z7982 Long term (current) use of aspirin: Secondary | ICD-10-CM

## 2022-06-24 DIAGNOSIS — R601 Generalized edema: Secondary | ICD-10-CM | POA: Diagnosis not present

## 2022-06-24 DIAGNOSIS — R270 Ataxia, unspecified: Secondary | ICD-10-CM | POA: Diagnosis not present

## 2022-06-24 DIAGNOSIS — R7889 Finding of other specified substances, not normally found in blood: Secondary | ICD-10-CM | POA: Diagnosis not present

## 2022-06-24 LAB — URINALYSIS, ROUTINE W REFLEX MICROSCOPIC
Bilirubin Urine: NEGATIVE
Glucose, UA: >=500 mg/dL — AB
Hgb urine dipstick: NEGATIVE
Ketones, ur: NEGATIVE mg/dL
Leukocytes,Ua: NEGATIVE
Nitrite: NEGATIVE
Protein, ur: NEGATIVE mg/dL
Specific Gravity, Urine: 1.03 (ref 1.005–1.030)
pH: 5 (ref 5.0–8.0)

## 2022-06-24 LAB — CBC WITH DIFFERENTIAL/PLATELET
Abs Immature Granulocytes: 0.11 10*3/uL — ABNORMAL HIGH (ref 0.00–0.07)
Basophils Absolute: 0 10*3/uL (ref 0.0–0.1)
Basophils Relative: 0 %
Eosinophils Absolute: 0.1 10*3/uL (ref 0.0–0.5)
Eosinophils Relative: 0 %
HCT: 52.1 % — ABNORMAL HIGH (ref 39.0–52.0)
Hemoglobin: 17 g/dL (ref 13.0–17.0)
Immature Granulocytes: 1 %
Lymphocytes Relative: 13 %
Lymphs Abs: 1.6 10*3/uL (ref 0.7–4.0)
MCH: 29.7 pg (ref 26.0–34.0)
MCHC: 32.6 g/dL (ref 30.0–36.0)
MCV: 91.1 fL (ref 80.0–100.0)
Monocytes Absolute: 0.8 10*3/uL (ref 0.1–1.0)
Monocytes Relative: 7 %
Neutro Abs: 9.2 10*3/uL — ABNORMAL HIGH (ref 1.7–7.7)
Neutrophils Relative %: 79 %
Platelets: 224 10*3/uL (ref 150–400)
RBC: 5.72 MIL/uL (ref 4.22–5.81)
RDW: 13.4 % (ref 11.5–15.5)
WBC: 11.7 10*3/uL — ABNORMAL HIGH (ref 4.0–10.5)
nRBC: 0 % (ref 0.0–0.2)

## 2022-06-24 LAB — COMPREHENSIVE METABOLIC PANEL
ALT: 40 U/L (ref 0–44)
AST: 132 U/L — ABNORMAL HIGH (ref 15–41)
Albumin: 3.7 g/dL (ref 3.5–5.0)
Alkaline Phosphatase: 120 U/L (ref 38–126)
Anion gap: 11 (ref 5–15)
BUN: 22 mg/dL (ref 8–23)
CO2: 24 mmol/L (ref 22–32)
Calcium: 9.1 mg/dL (ref 8.9–10.3)
Chloride: 102 mmol/L (ref 98–111)
Creatinine, Ser: 0.82 mg/dL (ref 0.61–1.24)
GFR, Estimated: >60 mL/min (ref >60)
Glucose, Bld: 156 mg/dL — ABNORMAL HIGH (ref 70–99)
Potassium: 3.8 mmol/L (ref 3.5–5.1)
Sodium: 137 mmol/L (ref 135–145)
Total Bilirubin: 1.6 mg/dL — ABNORMAL HIGH (ref 0.3–1.2)
Total Protein: 6.7 g/dL (ref 6.5–8.1)

## 2022-06-24 LAB — GLUCOSE, CAPILLARY
Glucose-Capillary: 69 mg/dL — ABNORMAL LOW (ref 70–99)
Glucose-Capillary: 94 mg/dL (ref 70–99)
Glucose-Capillary: 132 mg/dL — ABNORMAL HIGH (ref 70–99)

## 2022-06-24 LAB — TROPONIN I (HIGH SENSITIVITY)
Troponin I (High Sensitivity): 74 ng/L — ABNORMAL HIGH (ref <18)
Troponin I (High Sensitivity): 66 ng/L — ABNORMAL HIGH (ref <18)

## 2022-06-24 LAB — BRAIN NATRIURETIC PEPTIDE: B Natriuretic Peptide: 103.5 pg/mL — ABNORMAL HIGH (ref 0.0–100.0)

## 2022-06-24 LAB — CK: Total CK: 2014 U/L — ABNORMAL HIGH (ref 49–397)

## 2022-06-24 MED ORDER — SODIUM CHLORIDE 0.9 % IV BOLUS
500.0000 mL | Freq: Once | INTRAVENOUS | Status: AC
  Administered 2022-06-24: 500 mL via INTRAVENOUS

## 2022-06-24 MED ORDER — INSULIN ASPART 100 UNIT/ML IJ SOLN
0.0000 [IU] | Freq: Three times a day (TID) | INTRAMUSCULAR | Status: DC
  Filled 2022-06-24: qty 0.15

## 2022-06-24 MED ORDER — ENOXAPARIN SODIUM 40 MG/0.4ML IJ SOSY
40.0000 mg | PREFILLED_SYRINGE | INTRAMUSCULAR | Status: DC
  Administered 2022-06-24 – 2022-07-06 (×12): 40 mg via SUBCUTANEOUS
  Filled 2022-06-24 (×12): qty 0.4

## 2022-06-24 MED ORDER — SODIUM CHLORIDE 0.9 % IV SOLN: INTRAVENOUS | Status: DC

## 2022-06-24 MED ORDER — ACETAMINOPHEN 325 MG PO TABS: 650.0000 mg | ORAL_TABLET | Freq: Four times a day (QID) | ORAL | Status: DC | PRN

## 2022-06-24 MED ORDER — ACETAMINOPHEN 650 MG RE SUPP: 650.0000 mg | Freq: Four times a day (QID) | RECTAL | Status: DC | PRN

## 2022-06-24 NOTE — ED Triage Notes (Signed)
Per son, pt has been having multiple falls and has been having bilateral foot swelling. Son states that he cannot take care of himself and he hurt his back picking him up on Wednesday.

## 2022-06-24 NOTE — Progress Notes (Signed)
Hypoglycemic Event  CBG: 69  Treatment: 4 oz juice/soda  Symptoms: None  Follow-up CBG: Time:1705 CBG Result:94  Possible Reasons for Event: Inadequate meal intake  Comments/MD notified:Yes    Georgena Spurling

## 2022-06-24 NOTE — ED Notes (Signed)
Pt unable to urinate.

## 2022-06-24 NOTE — ED Provider Notes (Signed)
[facility] DEPT Provider Note   CSN: [id number] Arrival date & time: 06/24/22  2778     History {Add pertinent medical, surgical, social history, OB history to HPI:1} Chief Complaint  Patient presents with   Fall   Foot Swelling    Derrick Wilson is a 88 y.o. male.  Patient has a history of hypertension elevated cholesterol.  Patient complains of swelling in his legs and weakness in his legs.  Patient fell 2 days ago and was on the floor for 9 hours and then again today patient fell and was only on the floor for short period of time  The history is provided by the patient and medical records. No language interpreter was used.  Fall This is a new problem. The current episode started 12 to 24 hours ago. The problem occurs rarely. The problem has been resolved. Pertinent negatives include no chest pain, no abdominal pain and no headaches. Nothing aggravates the symptoms. Nothing relieves the symptoms.       Home Medications Prior to Admission medications   Medication Sig Start Date End Date Taking? Authorizing Provider  ASPIRIN 81 PO Take by mouth.    [provider]  atorvastatin (LIPITOR) 20 MG tablet Take 20 mg by mouth every evening.    [provider]  brimonidine (ALPHAGAN P) 0.15 % ophthalmic solution Place 1 drop into both eyes at bedtime. Use one drop each eye bid     [provider]  Canagliflozin (INVOKANA) 100 MG TABS Take 100 mg by mouth every morning.     [provider]  clopidogrel (PLAVIX) 75 MG tablet Take 1 tablet (75 mg total) by mouth daily. 07/31/18   Mariel Aloe, MD  glipiZIDE (GLUCOTROL XL) 5 MG 24 hr tablet Take 5 mg by mouth daily with breakfast.    [provider]  glucose blood test strip 1 each by Other route as needed. Use as instructed    [provider]  insulin glargine (LANTUS) 100 UNIT/ML injection Inject 20 Units into the skin every morning.     [provider]  latanoprost (XALATAN) 0.005 % ophthalmic solution Place 1 drop into both eyes at bedtime.    [provider]  losartan (COZAAR) 100 MG tablet Take 100 mg by mouth every morning.     [provider]  metFORMIN (GLUCOPHAGE) 1000 MG tablet Take 1,000 mg by mouth at bedtime.     [provider]  Multiple Vitamin (MULTIVITAMIN) tablet Take 1 tablet by mouth every morning. Mature multi-vitamin and minerals    [provider]  [facility] DELICA LANCETS MISC by Does not apply route daily.    [provider]  Semaglutide,0.25 or 0.'5MG'$ /DOS, (OZEMPIC, 0.25 OR 0.5 MG/DOSE,) 2 MG/1.5ML SOPN INJECT 0.5 MG ONCE A WEEK 01/22/19   [provider]  timolol (TIMOPTIC) 0.25 % ophthalmic solution Place 1 drop into both eyes every morning.     [provider]  traMADol (ULTRAM) 50 MG tablet Take 1 tablet (50 mg total) by mouth every 6 (six) hours as needed. 04/16/19   Daryll Brod, MD      Allergies    Patient has no known allergies.    Review of Systems   Review of Systems  Constitutional:  Negative for appetite change and fatigue.  HENT:  Negative for congestion, ear discharge and sinus pressure.   Eyes:  Negative for discharge.  Respiratory:  Negative for cough.   Cardiovascular:  Negative for  chest pain.  Gastrointestinal:  Negative for abdominal pain and diarrhea.  Genitourinary:  Negative for frequency and hematuria.  Musculoskeletal:  Negative for back pain.  Skin:  Negative for rash.  Neurological:  Positive for weakness. Negative for seizures and headaches.  Psychiatric/Behavioral:  Negative for hallucinations.     Physical Exam Updated Vital Signs BP 116/65   Pulse 86   Temp 98 F (36.7 C)   Resp 14   Ht '5\' 9"'$  (1.753 m)   Wt 90.7 kg   SpO2 96%   BMI 29.53 kg/m  Physical Exam Vitals and nursing note reviewed.  Constitutional:      Appearance: He is well-developed.  HENT:     Head: Normocephalic.     Nose: Nose  normal.     Mouth/Throat:     Comments: Dry mucous membranes Eyes:     General: No scleral icterus.    Conjunctiva/sclera: Conjunctivae normal.  Neck:     Thyroid: No thyromegaly.  Cardiovascular:     Rate and Rhythm: Normal rate and regular rhythm.     Heart sounds: No murmur heard.    No friction rub. No gallop.  Pulmonary:     Breath sounds: No stridor. No wheezing or rales.  Chest:     Chest wall: No tenderness.  Abdominal:     General: There is no distension.     Tenderness: There is no abdominal tenderness. There is no rebound.  Musculoskeletal:        General: Normal range of motion.     Cervical back: Neck supple.     Comments: Mild lumbar tenderness  Lymphadenopathy:     Cervical: No cervical adenopathy.  Skin:    Findings: No erythema or rash.  Neurological:     Mental Status: He is alert and oriented to person, place, and time.     Motor: No abnormal muscle tone.     Coordination: Coordination normal.     Comments: Moderate to severe weakness in both lower extremities.  Patient unable to ambulate can barely stand with assistance.  Patient has 2+ edema from the knees down bilaterally  Psychiatric:        Behavior: Behavior normal.     ED Results / Procedures / Treatments   Labs (all labs ordered are listed, but only abnormal results are displayed) Labs Reviewed  CBC WITH DIFFERENTIAL/PLATELET - Abnormal; Notable for the following components:      Result Value   WBC 11.7 (*)    HCT 52.1 (*)    Neutro Abs 9.2 (*)    Abs Immature Granulocytes 0.11 (*)    All other components within normal limits  COMPREHENSIVE METABOLIC PANEL - Abnormal; Notable for the following components:   Glucose, Bld 156 (*)    AST 132 (*)    Total Bilirubin 1.6 (*)    All other components within normal limits  BRAIN NATRIURETIC PEPTIDE - Abnormal; Notable for the following components:   B Natriuretic Peptide 103.5 (*)    All other components within normal limits  CK - Abnormal;  Notable for the following components:   Total CK 2,014 (*)    All other components within normal limits  TROPONIN I (HIGH SENSITIVITY) - Abnormal; Notable for the following components:   Troponin I (High Sensitivity) 74 (*)    All other components within normal limits  TROPONIN I (HIGH SENSITIVITY) - Abnormal; Notable for the following components:   Troponin I (High Sensitivity) 66 (*)    All  other components within normal limits  URINALYSIS, ROUTINE W REFLEX MICROSCOPIC    EKG EKG Interpretation  Date/Time:  Friday June 24 2022 08:33:45 EST Ventricular Rate:  86 PR Interval:  249 QRS Duration: 137 QT Interval:  390 QTC Calculation: 467 R Axis:   -49 Text Interpretation: Sinus rhythm Prolonged PR interval Right bundle branch block Inferior infarct, age indeterminate Confirmed by Milton Ferguson ([id number]) on 06/24/2022 10:21:12 AM  Radiology CT Lumbar Spine Wo Contrast  Result Date: 06/24/2022 CLINICAL DATA:  Ataxia.  Multiple Falls EXAM: CT LUMBAR SPINE WITHOUT CONTRAST TECHNIQUE: Multidetector CT imaging of the lumbar spine was performed without intravenous contrast administration. Multiplanar CT image reconstructions were also generated. RADIATION DOSE REDUCTION: This exam was performed according to the departmental dose-optimization program which includes automated exposure control, adjustment of the mA and/or kV according to patient size and/or use of iterative reconstruction technique. COMPARISON:  None Available. FINDINGS: Segmentation: 5 lumbar type vertebrae. Alignment: There is trace retrolisthesis of L3 on L4. Grade 1 anterolisthesis of S1 on S2. Vertebrae: There is age indeterminate, but likely subacute to chronic appearing anterior superior endplate compression deformities at T11, T12, and L1. there are multilevel degenerative endplate changes, most significant at S1-S2 where there is sclerosis and cortical irregularity. There is no evidence of surrounding soft tissue stranding  to definitively suggest the presence of discitis osteomyelitis. Paraspinal and other soft tissues: Status post cholecystectomy. Aortic and pelvic vasculature atherosclerotic calcifications. Disc levels: There are multilevel degenerative changes with disc space loss at nearly every lumbar vertebral body level. There is severe left-sided neural foraminal stenosis at L4-L5, L5-S1, and S1-S2. There is severe right-sided neural foraminal stenosis at L3-L4 L4-L5, L5-S1 and S1-S2. There are multilevel degenerative disc bulges without evidence of high-grade spinal canal stenosis. IMPRESSION: 1. Age-indeterminate, but likely subacute to chronic appearing anterior superior endplate compression deformities at T11, T12, and L1. Correlate with point tenderness. 2. Multilevel degenerative changes with disc space loss at nearly every lumbar vertebral body level. Severe left-sided neural foraminal stenosis at L4-L5, L5-S1, and S1-S2. Severe right-sided neural foraminal stenosis at L3-L4, L4-L5, L5-S1, and S1-S2. 3. No evidence of high-grade spinal canal stenosis. Aortic Atherosclerosis (ICD10-I70.0). Electronically Signed   By: Marin Roberts M.D.   On: 06/24/2022 12:22   CT Head Wo Contrast  Result Date: 06/24/2022 CLINICAL DATA:  Multiple falls.  Evaluate for bleed. EXAM: CT HEAD WITHOUT CONTRAST TECHNIQUE: Contiguous axial images were obtained from the base of the skull through the vertex without intravenous contrast. RADIATION DOSE REDUCTION: This exam was performed according to the departmental dose-optimization program which includes automated exposure control, adjustment of the mA and/or kV according to patient size and/or use of iterative reconstruction technique. COMPARISON:  Brain CT 07/29/2018 FINDINGS: Brain: Ventricles and sulci are prominent compatible with atrophy. Chronic microvascular ischemic changes. No evidence for acute cortically based infarct, intracranial hemorrhage, mass lesion or mass-effect. Vascular:  No hyperdense vessel or unexpected calcification. Skull: Normal. Negative for fracture or focal lesion. Sinuses/Orbits: Paranasal sinuses are well aerated. Mastoid air cells are unremarkable. Other: None IMPRESSION: 1. No acute intracranial process. 2. Atrophy and chronic microvascular ischemic changes. Electronically Signed   By: Lovey Newcomer M.D.   On: 06/24/2022 08:28   DG Pelvis 1-2 Views  Result Date: 06/24/2022 CLINICAL DATA:  Multiple falls.  Hip EXAM: PELVIS - 1-2 VIEW COMPARISON:  None Available. FINDINGS: Single view radiograph is available dictation. There is no evidence of pelvic fracture or diastasis. No pelvic bone lesions are seen.  Severe disc space loss in the lower lumbar spine. Mild degenerative changes of the bilateral hip joints. Trochanteric enthesopathic changes. Vascular calcifications. Pelvic phleboliths. IMPRESSION: No acute fracture or dislocation on this single view pelvis radiograph. Electronically Signed   By: Marin Roberts M.D.   On: 06/24/2022 07:56   DG Chest 2 View  Result Date: 06/24/2022 CLINICAL DATA:  Fall. EXAM: CHEST - 2 VIEW COMPARISON:  Chest radiograph 07/29/2018 FINDINGS: Stable cardiac and mediastinal contours. Low lung volumes. Patchy heterogeneous opacities within the lung bases bilaterally. No pleural effusion or pneumothorax. Thoracic spine degenerative changes. IMPRESSION: Low lung volumes with basilar atelectasis. Electronically Signed   By: Lovey Newcomer M.D.   On: 06/24/2022 07:54    Procedures Procedures  {Document cardiac monitor, telemetry assessment procedure when appropriate:1}  Medications Ordered in ED Medications  sodium chloride 0.9 % bolus 500 mL (500 mLs Intravenous New Bag/Given 06/24/22 1048)    ED Course/ Medical Decision Making/ A&P                           Medical Decision Making Amount and/or Complexity of Data Reviewed Labs: ordered. Radiology: ordered. ECG/medicine tests: ordered.  Risk Decision regarding  hospitalization.   Patient with rhabdomyolysis and frequent falls.  He will be admitted to medicine  {Document critical care time when appropriate:1} {Document review of labs and clinical decision tools ie heart score, Chads2Vasc2 etc:1}  {Document your independent review of radiology images, and any outside records:1} {Document your discussion with family members, caretakers, and with consultants:1} {Document social determinants of health affecting pt's care:1} {Document your decision making why or why not admission, treatments were needed:1} Final Clinical Impression(s) / ED Diagnoses Final diagnoses:  Traumatic rhabdomyolysis, initial encounter [facility])    Rx / DC Orders ED Discharge Orders     None

## 2022-06-24 NOTE — ED Notes (Signed)
ED TO INPATIENT HANDOFF REPORT  ED Nurse Name and Phone #: Charlean Sanfilippo RN  S Name/Age/Gender Derrick Wilson 88 y.o. male Room/Bed: WA18/WA18  Code Status   Code Status: Prior  Home/SNF/Other Family would like to find snf  Patient oriented to: self, place, time, and situation Is this baseline? Yes   Triage Complete: Triage complete  Chief Complaint Rhabdomyolysis [M62.82]  Triage Note Per son, pt has been having multiple falls and has been having bilateral foot swelling. Son states that he cannot take care of himself and he hurt his back picking him up on Wednesday.    Allergies No Known Allergies  Level of Care/Admitting Diagnosis ED Disposition     ED Disposition  Admit   Condition  --   Comment  Hospital Area: [facility] [[id number]]  Level of Care: Med-Surg [16]  May admit patient to [facility] or [facility] if equivalent level of care is available:: No  Covid Evaluation: Asymptomatic - no recent exposure (last 10 days) testing not required  Diagnosis: Rhabdomyolysis [728.88.ICD-9-CM]  Admitting Physician: Jonnie Finner [[id number]]  Attending Physician: Jonnie Finner [[id number]]  Certification:: I certify this patient will need inpatient services for at least 2 midnights  Estimated Length of Stay: 2          B Medical/Surgery History Past Medical History:  Diagnosis Date   Diabetes mellitus    Edema leg    legs    Glaucoma    Hyperlipidemia    Hypertension    Stroke ([place]) 07/2018   possible TIA-on Plavix   Past Surgical History:  Procedure Laterality Date   APPENDECTOMY     CARPAL TUNNEL RELEASE Left 04/16/2019   Procedure: CARPAL TUNNEL RELEASE;  Surgeon: Daryll Brod, MD;  Location: [place];  Service: Orthopedics;  Laterality: Left;  AXILLARY BLOCK ANESTHESIA   CHOLECYSTECTOMY     COLONOSCOPY     ELBOW FRACTURE SURGERY     TONSILLECTOMY AND ADENOIDECTOMY     ULNAR NERVE TRANSPOSITION Left 04/16/2019    Procedure: ULNAR NERVE DECOMPRESSION AT CUBITAL TUNNEL;  Surgeon: Daryll Brod, MD;  [facility];  Service: Orthopedics;  Laterality: Left;  AXILLARY BLOCK ANESTHESIA     A IV Location/Drains/Wounds Patient Lines/Drains/Airways Status     Active Line/Drains/Airways     Name Placement date Placement time Site Days   Peripheral IV 06/24/22 20 G Left Hand 06/24/22  1019  Hand  less than 1   Airway 04/16/19  0848  -- 1165   Incision (Closed) 04/16/19 Wrist Left 04/16/19  0917  -- 1165   Incision (Closed) 04/16/19 Elbow Left 04/16/19  0934  -- 1165   Wound / Incision (Open or Dehisced) 07/29/18  Knee Anterior;Right;Left 07/29/18  2345  Knee  1426            Intake/Output Last 24 hours No intake or output data in the 24 hours ending 06/24/22 1421  Labs/Imaging Results for orders placed or performed during the hospital encounter of 06/24/22 (from the past 48 hour(s))  CBC with Differential     Status: Abnormal   Collection Time: 06/24/22  5:34 AM  Result Value Ref Range   WBC 11.7 (H) 4.0 - 10.5 K/uL   RBC 5.72 4.22 - 5.81 MIL/uL   Hemoglobin 17.0 13.0 - 17.0 g/dL   HCT 52.1 (H) 39.0 - 52.0 %   MCV 91.1 80.0 - 100.0 fL   MCH 29.7 26.0 - 34.0 pg  MCHC 32.6 30.0 - 36.0 g/dL   RDW 13.4 11.5 - 15.5 %   Platelets 224 150 - 400 K/uL   nRBC 0.0 0.0 - 0.2 %   Neutrophils Relative % 79 %   Neutro Abs 9.2 (H) 1.7 - 7.7 K/uL   Lymphocytes Relative 13 %   Lymphs Abs 1.6 0.7 - 4.0 K/uL   Monocytes Relative 7 %   Monocytes Absolute 0.8 0.1 - 1.0 K/uL   Eosinophils Relative 0 %   Eosinophils Absolute 0.1 0.0 - 0.5 K/uL   Basophils Relative 0 %   Basophils Absolute 0.0 0.0 - 0.1 K/uL   Immature Granulocytes 1 %   Abs Immature Granulocytes 0.11 (H) 0.00 - 0.07 K/uL    Comment: Performed at [facility], [place] [address], [place], [place] [address]  Comprehensive metabolic panel     Status: Abnormal   Collection Time: 06/24/22  5:34 AM  Result  Value Ref Range   Sodium 137 135 - 145 mmol/L   Potassium 3.8 3.5 - 5.1 mmol/L   Chloride 102 98 - 111 mmol/L   CO2 24 22 - 32 mmol/L   Glucose, Bld 156 (H) 70 - 99 mg/dL    Comment: Glucose reference range applies only to samples taken after fasting for at least 8 hours.   BUN 22 8 - 23 mg/dL   Creatinine, Ser 0.82 0.61 - 1.24 mg/dL   Calcium 9.1 8.9 - 10.3 mg/dL   Total Protein 6.7 6.5 - 8.1 g/dL   Albumin 3.7 3.5 - 5.0 g/dL   AST 132 (H) 15 - 41 U/L   ALT 40 0 - 44 U/L   Alkaline Phosphatase 120 38 - 126 U/L   Total Bilirubin 1.6 (H) 0.3 - 1.2 mg/dL   GFR, Estimated >60 >60 mL/min    Comment: (NOTE) Calculated using the CKD-EPI Creatinine Equation (2021)    Anion gap 11 5 - 15    Comment: Performed at  [facility], [place] [address], [place], [place] [address]  Brain natriuretic peptide     Status: Abnormal   Collection Time: 06/24/22  5:34 AM  Result Value Ref Range   B Natriuretic Peptide 103.5 (H) 0.0 - 100.0 pg/mL    Comment: Performed at [facility], [place] [address], [place], [place] [address]  CK     Status: Abnormal   Collection Time: 06/24/22  5:34 AM  Result Value Ref Range   Total CK 2,014 (H) 49 - 397 U/L    Comment: Performed at [facility] [place] [address] [place], [place] [address]  Troponin I (High Sensitivity)     Status: Abnormal   Collection Time: 06/24/22  5:34 AM  Result Value Ref Range   Troponin I (High Sensitivity) 74 (H) <18 ng/L    Comment: (NOTE) Elevated high sensitivity troponin I (hsTnI) values and significant  changes across serial measurements may suggest ACS but many other  chronic and acute conditions are known to elevate hsTnI results.  Refer to the "Links" section for chest pain algorithms and additional  guidance. Performed at [facility], [place] [address] [place],  [address]   Troponin I (High Sensitivity)     Status: Abnormal   Collection Time:  06/24/22 11:48 AM  Result Value Ref Range   Troponin I (High Sensitivity) 66 (H) <18 ng/L    Comment: (NOTE) Elevated high sensitivity troponin I (hsTnI) values and significant  changes across serial measurements may  suggest ACS but many other  chronic and acute conditions are known to elevate hsTnI results.  Refer to the "Links" section for chest pain algorithms and additional  guidance. Performed at [facility], [place] [address] [place], [place] [address]    CT Lumbar Spine Wo Contrast  Result Date: 06/24/2022 CLINICAL DATA:  Ataxia.  Multiple Falls EXAM: CT LUMBAR SPINE WITHOUT CONTRAST TECHNIQUE: Multidetector CT imaging of the lumbar spine was performed without intravenous contrast administration. Multiplanar CT image reconstructions were also generated. RADIATION DOSE REDUCTION: This exam was performed according to the departmental dose-optimization program which includes automated exposure control, adjustment of the mA and/or kV according to patient size and/or use of iterative reconstruction technique. COMPARISON:  None Available. FINDINGS: Segmentation: 5 lumbar type vertebrae. Alignment: There is trace retrolisthesis of L3 on L4. Grade 1 anterolisthesis of S1 on S2. Vertebrae: There is age indeterminate, but likely subacute to chronic appearing anterior superior endplate compression deformities at T11, T12, and L1. there are multilevel degenerative endplate changes, most significant at S1-S2 where there is sclerosis and cortical irregularity. There is no evidence of surrounding soft tissue stranding to definitively suggest the presence of discitis osteomyelitis. Paraspinal and other soft tissues: Status post cholecystectomy. Aortic and pelvic vasculature atherosclerotic calcifications. Disc levels: There are multilevel degenerative changes with disc space loss at nearly every lumbar vertebral body level. There is severe left-sided neural foraminal stenosis at L4-L5, L5-S1,  and S1-S2. There is severe right-sided neural foraminal stenosis at L3-L4 L4-L5, L5-S1 and S1-S2. There are multilevel degenerative disc bulges without evidence of high-grade spinal canal stenosis. IMPRESSION: 1. Age-indeterminate, but likely subacute to chronic appearing anterior superior endplate compression deformities at T11, T12, and L1. Correlate with point tenderness. 2. Multilevel degenerative changes with disc space loss at nearly every lumbar vertebral body level. Severe left-sided neural foraminal stenosis at L4-L5, L5-S1, and S1-S2. Severe right-sided neural foraminal stenosis at L3-L4, L4-L5, L5-S1, and S1-S2. 3. No evidence of high-grade spinal canal stenosis. Aortic Atherosclerosis (ICD10-I70.0). Electronically Signed   By: Marin Roberts M.D.   On: 06/24/2022 12:22   CT Head Wo Contrast  Result Date: 06/24/2022 CLINICAL DATA:  Multiple falls.  Evaluate for bleed. EXAM: CT HEAD WITHOUT CONTRAST TECHNIQUE: Contiguous axial images were obtained from the base of the skull through the vertex without intravenous contrast. RADIATION DOSE REDUCTION: This exam was performed according to the departmental dose-optimization program which includes automated exposure control, adjustment of the mA and/or kV according to patient size and/or use of iterative reconstruction technique. COMPARISON:  Brain CT 07/29/2018 FINDINGS: Brain: Ventricles and sulci are prominent compatible with atrophy. Chronic microvascular ischemic changes. No evidence for acute cortically based infarct, intracranial hemorrhage, mass lesion or mass-effect. Vascular: No hyperdense vessel or unexpected calcification. Skull: Normal. Negative for fracture or focal lesion. Sinuses/Orbits: Paranasal sinuses are well aerated. Mastoid air cells are unremarkable. Other: None IMPRESSION: 1. No acute intracranial process. 2. Atrophy and chronic microvascular ischemic changes. Electronically Signed   By: Lovey Newcomer M.D.   On: 06/24/2022 08:28   DG  Pelvis 1-2 Views  Result Date: 06/24/2022 CLINICAL DATA:  Multiple falls.  Hip EXAM: PELVIS - 1-2 VIEW COMPARISON:  None Available. FINDINGS: Single view radiograph is available dictation. There is no evidence of pelvic fracture or diastasis. No pelvic bone lesions are seen. Severe disc space loss in the lower lumbar spine. Mild degenerative changes of the bilateral hip joints. Trochanteric enthesopathic changes. Vascular calcifications. Pelvic phleboliths. IMPRESSION: No acute fracture or  dislocation on this single view pelvis radiograph. Electronically Signed   By: Marin Roberts M.D.   On: 06/24/2022 07:56   DG Chest 2 View  Result Date: 06/24/2022 CLINICAL DATA:  Fall. EXAM: CHEST - 2 VIEW COMPARISON:  Chest radiograph 07/29/2018 FINDINGS: Stable cardiac and mediastinal contours. Low lung volumes. Patchy heterogeneous opacities within the lung bases bilaterally. No pleural effusion or pneumothorax. Thoracic spine degenerative changes. IMPRESSION: Low lung volumes with basilar atelectasis. Electronically Signed   By: Lovey Newcomer M.D.   On: 06/24/2022 07:54    Pending Labs Unresulted Labs (From admission, onward)     Start     Ordered   06/24/22 1402  Hemoglobin A1c  Once,   R       Comments: To assess prior glycemic control    06/24/22 1401   06/24/22 0734  Urinalysis, Routine w reflex microscopic  Once,   URGENT        06/24/22 8502   Signed and Held  Comprehensive metabolic panel  Tomorrow morning,   R        Signed and Held   Signed and Held  CBC  Tomorrow morning,   R        Signed and Held   Signed and Held  CK  Daily,   R      Signed and Held            Vitals/Pain Today's Vitals   06/24/22 0945 06/24/22 1100 06/24/22 1300 06/24/22 1330  BP: 133/69 119/65 116/65 130/65  Pulse: 88 83 86 90  Resp: 18  14   Temp: 98 F (36.7 C)   97.9 F (36.6 C)  TempSrc:      SpO2: 97% 99% 96% 95%  Weight:      Height:      PainSc:        Isolation Precautions No active  isolations  Medications Medications  insulin aspart (novoLOG) injection 0-15 Units (has no administration in time range)  0.9 %  sodium chloride infusion (has no administration in time range)  sodium chloride 0.9 % bolus 500 mL (500 mLs Intravenous New Bag/Given 06/24/22 1048)    Mobility walks with person assist High fall risk   Focused Assessments     R Recommendations: See Admitting Provider Note  Report given to:   Additional Notes:

## 2022-06-24 NOTE — H&P (Signed)
History and Physical    Patient: Derrick Wilson LYY:[id number] DOB: 07-09-1934 DOA: 06/24/2022 DOS: the patient was seen and examined on 06/24/2022 PCP: Donnajean Lopes, MD  Patient coming from: Home  Chief Complaint:  Chief Complaint  Patient presents with   Fall   Foot Swelling   HPI: Derrick Wilson is a 88 y.o. male with medical history significant of DM, glaucoma, HTN, HLD. Presenting with falls. He reports 2 days ago he fell while moving into his bedroom. He was using his walker and tripped over an extension cord. He didn't hit his head. He didn't have any LOC. He didn't have any CP, palpitations, dyspnea prior to his fall. He was unable to get up on his own and was down on the ground for at least 13 hours. His son was able to come get him up. He remained at home. Last night, the patient had another fall while moving around his bedroom. He was using his cane and his knees gave out. He didn't hit his head. He didn't suffer LOC. He was unable to get up on his own. He was down for at least 4 hours before his son was able to find him can call EMS. He denies any other aggravating or alleviating factors.    Review of Systems: As mentioned in the history of present illness. All other systems reviewed and are negative. Past Medical History:  Diagnosis Date   Diabetes mellitus    Edema leg    legs    Glaucoma    Hyperlipidemia    Hypertension    Stroke ([place]) 07/2018   possible TIA-on Plavix   Past Surgical History:  Procedure Laterality Date   APPENDECTOMY     CARPAL TUNNEL RELEASE Left 04/16/2019   Procedure: CARPAL TUNNEL RELEASE;  Surgeon: Daryll Brod, MD;  Location: [place];  Service: Orthopedics;  Laterality: Left;  AXILLARY BLOCK ANESTHESIA   CHOLECYSTECTOMY     COLONOSCOPY     ELBOW FRACTURE SURGERY     TONSILLECTOMY AND ADENOIDECTOMY     ULNAR NERVE TRANSPOSITION Left 04/16/2019   Procedure: ULNAR NERVE DECOMPRESSION AT CUBITAL TUNNEL;  Surgeon:  Daryll Brod, MD;  Location: [place];  Service: Orthopedics;  Laterality: Left;  AXILLARY BLOCK ANESTHESIA   Social History:  reports that he has never smoked. He has never used smokeless tobacco. He reports that he does not drink alcohol and does not use drugs.  No Known Allergies  Family History  Problem Relation Age of Onset   Diabetes Father    Stroke Father     Prior to Admission medications   Medication Sig Start Date End Date Taking? Authorizing Provider  ASPIRIN 81 PO Take by mouth.    [provider]  atorvastatin (LIPITOR) 20 MG tablet Take 20 mg by mouth every evening.    [provider]  brimonidine (ALPHAGAN P) 0.15 % ophthalmic solution Place 1 drop into both eyes at bedtime. Use one drop each eye bid     [provider]  Canagliflozin (INVOKANA) 100 MG TABS Take 100 mg by mouth every morning.     [provider]  clopidogrel (PLAVIX) 75 MG tablet Take 1 tablet (75 mg total) by mouth daily. 07/31/18   Mariel Aloe, MD  glipiZIDE (GLUCOTROL XL) 5 MG 24 hr tablet Take 5 mg by mouth daily with breakfast.    [provider]  glucose blood test strip 1 each by Other route as needed.  Use as instructed    [provider]  insulin glargine (LANTUS) 100 UNIT/ML injection Inject 20 Units into the skin every morning.     [provider]  latanoprost (XALATAN) 0.005 % ophthalmic solution Place 1 drop into both eyes at bedtime.    [provider]  losartan (COZAAR) 100 MG tablet Take 100 mg by mouth every morning.     [provider]  metFORMIN (GLUCOPHAGE) 1000 MG tablet Take 1,000 mg by mouth at bedtime.     [provider]  Multiple Vitamin (MULTIVITAMIN) tablet Take 1 tablet by mouth every morning. Mature multi-vitamin and minerals    [provider]  [facility] DELICA LANCETS MISC by Does not apply route daily.    [provider]  Semaglutide,0.25 or 0.'5MG'$ /DOS,  (OZEMPIC, 0.25 OR 0.5 MG/DOSE,) 2 MG/1.5ML SOPN INJECT 0.5 MG ONCE A WEEK 01/22/19   [provider]  timolol (TIMOPTIC) 0.25 % ophthalmic solution Place 1 drop into both eyes every morning.     [provider]  traMADol (ULTRAM) 50 MG tablet Take 1 tablet (50 mg total) by mouth every 6 (six) hours as needed. 04/16/19   Daryll Brod, MD    Physical Exam: Vitals:   06/24/22 0507 06/24/22 0530 06/24/22 0945 06/24/22 1100  BP: 113/70  133/69 119/65  Pulse: 87  88 83  Resp: 17  18   Temp: 98 F (36.7 C)  98 F (36.7 C)   TempSrc: Oral     SpO2: 100%  97% 99%  Weight:  90.7 kg    Height:  '5\' 9"'$  (1.753 m)     General: 88 y.o. male resting in bed in NAD Eyes: PERRL, normal sclera ENMT: Nares patent w/o discharge, orophaynx clear, dentition normal, ears w/o discharge/lesions/ulcers Neck: Supple, trachea midline Cardiovascular: RRR, +S1, S2, no m/g/r, equal pulses throughout Respiratory: CTABL, no w/r/r, normal WOB GI: BS+, NDNT, no masses noted, no organomegaly noted MSK: No e/c/c Neuro: A&O x 3, no focal deficits Psyc: Appropriate interaction and affect, calm/cooperative  Data Reviewed:  Results for orders placed or performed during the hospital encounter of 06/24/22 (from the past 24 hour(s))  CBC with Differential     Status: Abnormal   Collection Time: 06/24/22  5:34 AM  Result Value Ref Range   WBC 11.7 (H) 4.0 - 10.5 K/uL   RBC 5.72 4.22 - 5.81 MIL/uL   Hemoglobin 17.0 13.0 - 17.0 g/dL   HCT 52.1 (H) 39.0 - 52.0 %   MCV 91.1 80.0 - 100.0 fL   MCH 29.7 26.0 - 34.0 pg   MCHC 32.6 30.0 - 36.0 g/dL   RDW 13.4 11.5 - 15.5 %   Platelets 224 150 - 400 K/uL   nRBC 0.0 0.0 - 0.2 %   Neutrophils Relative % 79 %   Neutro Abs 9.2 (H) 1.7 - 7.7 K/uL   Lymphocytes Relative 13 %   Lymphs Abs 1.6 0.7 - 4.0 K/uL   Monocytes Relative 7 %   Monocytes Absolute 0.8 0.1 - 1.0 K/uL   Eosinophils Relative 0 %   Eosinophils Absolute 0.1 0.0 - 0.5 K/uL   Basophils Relative 0 %    Basophils Absolute 0.0 0.0 - 0.1 K/uL   Immature Granulocytes 1 %   Abs Immature Granulocytes 0.11 (H) 0.00 - 0.07 K/uL  Comprehensive metabolic panel     Status: Abnormal   Collection Time: 06/24/22  5:34 AM  Result Value Ref Range   Sodium 137 135 - 145  mmol/L   Potassium 3.8 3.5 - 5.1 mmol/L   Chloride 102 98 - 111 mmol/L   CO2 24 22 - 32 mmol/L   Glucose, Bld 156 (H) 70 - 99 mg/dL   BUN 22 8 - 23 mg/dL   Creatinine, Ser 0.82 0.61 - 1.24 mg/dL   Calcium 9.1 8.9 - 10.3 mg/dL   Total Protein 6.7 6.5 - 8.1 g/dL   Albumin 3.7 3.5 - 5.0 g/dL   AST 132 (H) 15 - 41 U/L   ALT 40 0 - 44 U/L   Alkaline Phosphatase 120 38 - 126 U/L   Total Bilirubin 1.6 (H) 0.3 - 1.2 mg/dL   GFR, Estimated >60 >60 mL/min   Anion gap 11 5 - 15  Brain natriuretic peptide     Status: Abnormal   Collection Time: 06/24/22  5:34 AM  Result Value Ref Range   B Natriuretic Peptide 103.5 (H) 0.0 - 100.0 pg/mL  CK     Status: Abnormal   Collection Time: 06/24/22  5:34 AM  Result Value Ref Range   Total CK 2,014 (H) 49 - 397 U/L  Troponin I (High Sensitivity)     Status: Abnormal   Collection Time: 06/24/22  5:34 AM  Result Value Ref Range   Troponin I (High Sensitivity) 74 (H) <18 ng/L  Troponin I (High Sensitivity)     Status: Abnormal   Collection Time: 06/24/22 11:48 AM  Result Value Ref Range   Troponin I (High Sensitivity) 66 (H) <18 ng/L   CXR: Low lung volumes with basilar atelectasis.   XR Pelvis No acute fracture or dislocation on this single view pelvis radiograph.  CTH: 1. No acute intracranial process. 2. Atrophy and chronic microvascular ischemic changes.  CT L-spine 1. Age-indeterminate, but likely subacute to chronic appearing anterior superior endplate compression deformities at T11, T12, and L1. Correlate with point tenderness. 2. Multilevel degenerative changes with disc space loss at nearly every lumbar vertebral body level. Severe left-sided neural foraminal stenosis at  L4-L5, L5-S1, and S1-S2. Severe right-sided neural foraminal stenosis at L3-L4, L4-L5, L5-S1, and S1-S2. 3. No evidence of high-grade spinal canal stenosis.  Assessment and Plan: Rhabdomyolysis     - admit to inpt, med-surg     - fluids     - trend CPK  Falls     - no focality on exam     - imaging negative for acute fractures     - PT/OT eval  DM2     - A1c, SSI, glucose checks, DM diet  HTN     - continue home regimen when confirmed  Glaucoma     - continue home regimen when confirmed  HLD     - continue home regimen when confirmed  Advance Care Planning:   Code Status: FULL  Consults: None  Family Communication: None at bedside  Severity of Illness: The appropriate patient status for this patient is INPATIENT. Inpatient status is judged to be reasonable and necessary in order to provide the required intensity of service to ensure the patient's safety. The patient's presenting symptoms, physical exam findings, and initial radiographic and laboratory data in the context of their chronic comorbidities is felt to place them at high risk for further clinical deterioration. Furthermore, it is not anticipated that the patient will be medically stable for discharge from the hospital within 2 midnights of admission.   * I certify that at the point of admission it is my clinical judgment that the patient will  require inpatient hospital care spanning beyond 2 midnights from the point of admission due to high intensity of service, high risk for further deterioration and high frequency of surveillance required.*  Author: Jonnie Finner, DO 06/24/2022 1:05 PM  For on call review [email or web address].

## 2022-06-25 DIAGNOSIS — M6282 Rhabdomyolysis: Secondary | ICD-10-CM

## 2022-06-25 DIAGNOSIS — W19XXXA Unspecified fall, initial encounter: Secondary | ICD-10-CM

## 2022-06-25 LAB — COMPREHENSIVE METABOLIC PANEL
ALT: 33 U/L (ref 0–44)
AST: 73 U/L — ABNORMAL HIGH (ref 15–41)
Albumin: 3.1 g/dL — ABNORMAL LOW (ref 3.5–5.0)
Alkaline Phosphatase: 90 U/L (ref 38–126)
Anion gap: 9 (ref 5–15)
BUN: 26 mg/dL — ABNORMAL HIGH (ref 8–23)
CO2: 24 mmol/L (ref 22–32)
Calcium: 8.5 mg/dL — ABNORMAL LOW (ref 8.9–10.3)
Chloride: 109 mmol/L (ref 98–111)
Creatinine, Ser: 0.78 mg/dL (ref 0.61–1.24)
GFR, Estimated: >60 mL/min (ref >60)
Glucose, Bld: 107 mg/dL — ABNORMAL HIGH (ref 70–99)
Potassium: 3.7 mmol/L (ref 3.5–5.1)
Sodium: 142 mmol/L (ref 135–145)
Total Bilirubin: 0.9 mg/dL (ref 0.3–1.2)
Total Protein: 5.7 g/dL — ABNORMAL LOW (ref 6.5–8.1)

## 2022-06-25 LAB — CBC
HCT: 47.3 % (ref 39.0–52.0)
Hemoglobin: 15 g/dL (ref 13.0–17.0)
MCH: 29.4 pg (ref 26.0–34.0)
MCHC: 31.7 g/dL (ref 30.0–36.0)
MCV: 92.7 fL (ref 80.0–100.0)
Platelets: 159 10*3/uL (ref 150–400)
RBC: 5.1 MIL/uL (ref 4.22–5.81)
RDW: 13.4 % (ref 11.5–15.5)
WBC: 7.4 10*3/uL (ref 4.0–10.5)
nRBC: 0 % (ref 0.0–0.2)

## 2022-06-25 LAB — GLUCOSE, CAPILLARY
Glucose-Capillary: 102 mg/dL — ABNORMAL HIGH (ref 70–99)
Glucose-Capillary: 197 mg/dL — ABNORMAL HIGH (ref 70–99)
Glucose-Capillary: 225 mg/dL — ABNORMAL HIGH (ref 70–99)
Glucose-Capillary: 209 mg/dL — ABNORMAL HIGH (ref 70–99)

## 2022-06-25 LAB — CK: Total CK: 499 U/L — ABNORMAL HIGH (ref 49–397)

## 2022-06-25 LAB — HEMOGLOBIN A1C
Hgb A1c MFr Bld: 8 % — ABNORMAL HIGH (ref 4.8–5.6)
Mean Plasma Glucose: 183 mg/dL

## 2022-06-25 MED ORDER — SODIUM CHLORIDE 0.9 % IV SOLN: INTRAVENOUS | Status: DC

## 2022-06-25 MED ORDER — TIMOLOL MALEATE 0.25 % OP SOLN
1.0000 [drp] | Freq: Every day | OPHTHALMIC | Status: DC
  Administered 2022-06-25 – 2022-07-07 (×13): 1 [drp] via OPHTHALMIC
  Filled 2022-06-25: qty 5

## 2022-06-25 MED ORDER — BRIMONIDINE TARTRATE 0.15 % OP SOLN
1.0000 [drp] | Freq: Once | OPHTHALMIC | Status: AC
  Administered 2022-07-04: 1 [drp] via OPHTHALMIC
  Filled 2022-06-25: qty 5

## 2022-06-25 MED ORDER — BRIMONIDINE TARTRATE 0.15 % OP SOLN
1.0000 [drp] | Freq: Every day | OPHTHALMIC | Status: AC
  Administered 2022-06-26: 1 [drp] via OPHTHALMIC
  Filled 2022-06-25: qty 5

## 2022-06-25 MED ORDER — ATORVASTATIN CALCIUM 20 MG PO TABS
20.0000 mg | ORAL_TABLET | Freq: Every day | ORAL | Status: AC
  Administered 2022-06-25: 20 mg via ORAL
  Filled 2022-06-25: qty 1

## 2022-06-25 NOTE — Progress Notes (Addendum)
PROGRESS NOTE  Zayan Delvecchio [facility]one JHE:[id number] DOB: 12-15-1933 DOA: 06/24/2022 PCP: Donnajean Lopes, MD  HPI/Recap of past 24 hours: BARRON VANLOAN is a 88 y.o. male with medical history significant of DM, glaucoma, HTN, HLD. Presenting with falls. He reports 2 days ago he fell while moving into his bedroom. He was using his walker and tripped over an extension cord. He didn't hit his head. He didn't have any LOC. He didn't have any CP, palpitations, dyspnea prior to his fall. He was unable to get up on his own and was down on the ground for at least 13 hours. His son was able to come get him up. He remained at home. Last night, the patient had another fall while moving around his bedroom. He was using his cane and his knees gave out. He didn't hit his head. He didn't suffer LOC. He was unable to get up on his own. He was down for at least 4 hours before his son was able to find him can call EMS.   In the ED was found to have rhabdomyolysis with CPK greater than 2000.  Started on IV fluid.  CPK is downtrending.  06/25/22: The patient was seen and examined at his bedside.  He reports generalized weakness.  Also reports bilateral lower extremity pitting edema, noted for the past few days.  BNP is slightly elevated at 103.  Assessment/Plan: Principal Problem:   Rhabdomyolysis Active Problems:   Unspecified glaucoma   DM2 (diabetes mellitus, type 2) ([place])   Essential hypertension   Falls   HLD (hyperlipidemia)  Rhabdomyolysis, improving Admitting CPK greater than 2000, downtrending, 499. Continue IV fluid hydration.  Falls Nonfocal exam. Imaging negative for acute fractures. PT recommending SNF.  [facility] facility, his preference.  His wife is currently getting her therapies there. PT OT with assistance and fall precautions.  Elevated troponin, suspect demand ischemia Initial troponin 74 down trended to 66 No evidence of acute ischemia on twelve-lead EKG Denies any anginal  symptoms at the time of this visit. Follow 2D echo  Bilateral lower extremity edema Elevated BNP Elevated troponin Obtain 2D echo, follow results Closely monitor volume status while on IV fluid. Reduce IV fluid NS rate to 50 cc/h x 1 day.   DM2 with hyperglycemia, improved Hemoglobin A1c 8.0 on 06/24/2022. Continue insulin sliding scale.  HTN BPs are soft Closely monitor vital signs.  Glaucoma     - continue home regimen when confirmed      Advance Care Planning:   Code Status: FULL   Consults: None   Family Communication: None at bedside    DVT prophylaxis: Subcu Lovenox daily  Status is: Inpatient The patient requires at least 2 midnights for further evaluation and treatment of present condition.    Objective: Vitals:   06/24/22 1641 06/24/22 2107 06/25/22 0522 06/25/22 1517  BP: (!) 143/91 129/69 133/88 103/78  Pulse: 86 88 63 77  Resp: '17 16 18 18  '$ Temp: 97.7 F (36.5 C) 98.1 F (36.7 C) 98 F (36.7 C) 97.9 F (36.6 C)  TempSrc: Oral Oral Oral Oral  SpO2: 97% 96% 100% 93%  Weight:      Height:        Intake/Output Summary (Last 24 hours) at 06/25/2022 1639 Last data filed at 06/25/2022 1519 Gross per 24 hour  Intake 358 ml  Output 400 ml  Net -42 ml   Filed Weights   06/24/22 0530  Weight: 90.7 kg    Exam:  General: 88 y.o. year-old male well developed well nourished in no acute distress.  Alert and oriented x3. Cardiovascular: Regular rate and rhythm with no rubs or gallops.  No thyromegaly or JVD noted.   Respiratory: Clear to auscultation with no wheezes or rales. Good inspiratory effort. Abdomen: Soft nontender nondistended with normal bowel sounds x4 quadrants. Musculoskeletal: 2+ pitting edema in lower extremities bilaterally.  Skin: No ulcerative lesions noted or rashes, Psychiatry: Mood is appropriate for condition and setting   Data Reviewed: CBC: Recent Labs  Lab 06/24/22 0534 06/25/22 0704  WBC 11.7* 7.4  NEUTROABS 9.2*   --   HGB 17.0 15.0  HCT 52.1* 47.3  MCV 91.1 92.7  PLT 224 779   Basic Metabolic Panel: Recent Labs  Lab 06/24/22 0534 06/25/22 0704  NA 137 142  K 3.8 3.7  CL 102 109  CO2 24 24  GLUCOSE 156* 107*  BUN 22 26*  CREATININE 0.82 0.78  CALCIUM 9.1 8.5*   GFR: Estimated Creatinine Clearance: 73.8 mL/min (by C-G formula based on SCr of 0.78 mg/dL). Liver Function Tests: Recent Labs  Lab 06/24/22 0534 06/25/22 0704  AST 132* 73*  ALT 40 33  ALKPHOS 120 90  BILITOT 1.6* 0.9  PROT 6.7 5.7*  ALBUMIN 3.7 3.1*   No results for input(s): "LIPASE", "AMYLASE" in the last 168 hours. No results for input(s): "AMMONIA" in the last 168 hours. Coagulation Profile: No results for input(s): "INR", "PROTIME" in the last 168 hours. Cardiac Enzymes: Recent Labs  Lab 06/24/22 0534 06/25/22 0704  CKTOTAL 2,014* 499*   BNP (last 3 results) No results for input(s): "PROBNP" in the last 8760 hours. HbA1C: Recent Labs    06/24/22 1647  HGBA1C 8.0*   CBG: Recent Labs  Lab 06/24/22 1705 06/24/22 2121 06/25/22 0737 06/25/22 1201 06/25/22 1621  GLUCAP 94 132* 102* 197* 225*   Lipid Profile: No results for input(s): "CHOL", "HDL", "LDLCALC", "TRIG", "CHOLHDL", "LDLDIRECT" in the last 72 hours. Thyroid Function Tests: No results for input(s): "TSH", "T4TOTAL", "FREET4", "T3FREE", "THYROIDAB" in the last 72 hours. Anemia Panel: No results for input(s): "VITAMINB12", "FOLATE", "FERRITIN", "TIBC", "IRON", "RETICCTPCT" in the last 72 hours. Urine analysis:    Component Value Date/Time   COLORURINE YELLOW 06/24/2022 1943   APPEARANCEUR CLEAR 06/24/2022 1943   LABSPEC 1.030 06/24/2022 1943   PHURINE 5.0 06/24/2022 1943   GLUCOSEU >=500 (A) 06/24/2022 1943   HGBUR NEGATIVE 06/24/2022 1943   BILIRUBINUR NEGATIVE 06/24/2022 [place] 06/24/2022 1943   PROTEINUR NEGATIVE 06/24/2022 1943   NITRITE NEGATIVE 06/24/2022 1943   LEUKOCYTESUR NEGATIVE 06/24/2022 1943    Sepsis Labs: '@LABRCNTIP'$ (procalcitonin:4,lacticidven:4)  )No results found for this or any previous visit (from the past 240 hour(s)).    Studies: No results found.  Scheduled Meds:  brimonidine  1 drop Both Eyes Once   enoxaparin (LOVENOX) injection  40 mg Subcutaneous Q24H   timolol  1 drop Both Eyes Daily    Continuous Infusions:  sodium chloride 100 mL/hr at 06/24/22 1654     LOS: 1 day     Kayleen Memos, MD [facility] Pager [phone]  If 7PM-7AM, please contact night-coverage [email or web address] Password [facility] 06/25/2022, 4:39 PM

## 2022-06-25 NOTE — Evaluation (Signed)
Physical Therapy Evaluation Patient Details Name: Derrick Wilson MRN: [id number] DOB: 05-25-1934 Today's Date: 06/25/2022  History of Present Illness  Pt is 88 yo male admitted 06/24/22 after 2 recent falls with rhabdomyolysis.  Pt has hx of DM, glaucoma, HTN, HLD, leg edema  Clinical Impression  Pt admitted with above diagnosis. At baseline, pt is independent and can ambulate in community.  He occasionally uses RW or Hurri-cane.  Pt had 2 falls in last week ,but no prior falls.  Pt does have foot drop on  L for ~ 8 years but does not wear AFO.  Dorsiflexion on R is also weak.  Pt required min-mod A for transfers and ambulated 52' but had 3 LOB requiring mod A to recover.  Pt will benefit from acute PT services and rehab at d/c due to fall risk. Pt could also benefit from AFO if he desires - has not wanted in past. Pt currently with functional limitations due to the deficits listed below (see PT Problem List). Pt will benefit from skilled PT to increase their independence and safety with mobility to allow discharge to the venue listed below.          Recommendations for follow up therapy are one component of a multi-disciplinary discharge planning process, led by the attending physician.  Recommendations may be updated based on patient status, additional functional criteria and insurance authorization.  Follow Up Recommendations Skilled nursing-short term rehab (<3 hours/day) Can patient physically be transported by private vehicle: Yes    Assistance Recommended at Discharge Intermittent Supervision/Assistance  Patient can return home with the following  A little help with walking and/or transfers;A little help with bathing/dressing/bathroom;Assistance with cooking/housework;Help with stairs or ramp for entrance    Equipment Recommendations Rolling walker (2 wheels)  Recommendations for Other Services       Functional Status Assessment Patient has had a recent decline in their functional  status and demonstrates the ability to make significant improvements in function in a reasonable and predictable amount of time.     Precautions / Restrictions Precautions Precautions: Fall      Mobility  Bed Mobility Overal bed mobility: Needs Assistance Bed Mobility: Supine to Sit, Sit to Supine     Supine to sit: Min assist, HOB elevated     General bed mobility comments: increased time; use of rails    Transfers Overall transfer level: Needs assistance Equipment used: Rolling walker (2 wheels) Transfers: Sit to/from Stand Sit to Stand: Mod assist, Min assist           General transfer comment: Performed x 2; mod A initially and min A second rep with cues for hand placement    Ambulation/Gait Ambulation/Gait assistance: Mod assist Gait Distance (Feet): 80 Feet Assistive device: Rolling walker (2 wheels) Gait Pattern/deviations: Step-through pattern, Decreased stride length, Decreased dorsiflexion - right, Decreased dorsiflexion - left Gait velocity: decreased     General Gait Details: Overall min guard but had 3 episodes of LOB (catching toe) requiring mod A to recover.  Pt has hx of foot drop on L but does not wear AFO (had been mentioned by PCP in past but pt reports he did not follow up).  Pt compensates wtih steppage gait but needs cues for focus on task at hand for safety.  Stairs            Wheelchair Mobility    Modified Rankin (Stroke Patients Only)       Balance Overall balance assessment: Needs assistance Sitting-balance  support: No upper extremity supported Sitting balance-Leahy Scale: Good     Standing balance support: Bilateral upper extremity supported Standing balance-Leahy Scale: Poor Standing balance comment: requiring RW                             Pertinent Vitals/Pain Pain Assessment Pain Assessment: No/denies pain    Home Living Family/patient expects to be discharged to:: Skilled nursing facility (Pt from  home but planning to go to [facility] SNF at d/c) Living Arrangements: Spouse/significant other (Spouse recently fx shoulder and at [facility]) Available Help at Discharge: Family;Available PRN/intermittently Type of Home: House Home Access: Stairs to enter   Entrance Stairs-Number of Steps: 1 Alternate Level Stairs-Number of Steps: 5+5; does have recliner on first floor Home Layout: Other (Comment);Two level;Bed/bath upstairs (split level) Home Equipment: Rolling Walker (2 wheels);Rollator (4 wheels) (Hurri-cane)      Prior Function Prior Level of Function : Independent/Modified Independent;Driving             Mobility Comments: Occasionally uses a RW or Hurri-cane; could ambulate in community; 2 falls this past week but no other falls ADLs Comments: Independent with adls and iadls     Hand Dominance        Extremity/Trunk Assessment   Upper Extremity Assessment Upper Extremity Assessment: Overall WFL for tasks assessed    Lower Extremity Assessment Lower Extremity Assessment: LLE deficits/detail;RLE deficits/detail RLE Deficits / Details: + edema; ROM WFL; MMT: ankle DF 4-/5, knee ext 5/5, hip flex 4/5 RLE Sensation: WNL LLE Deficits / Details: +edema; ROM WFL; MMT : ankle DF 0/5 baseline, knee ext 5/5, hip flex 4/5 LLE Sensation: WNL    Cervical / Trunk Assessment Cervical / Trunk Assessment: Kyphotic  Communication   Communication: HOH  Cognition Arousal/Alertness: Awake/alert Behavior During Therapy: WFL for tasks assessed/performed Overall Cognitive Status: Within Functional Limits for tasks assessed                                          General Comments General comments (skin integrity, edema, etc.): VSS    Exercises     Assessment/Plan    PT Assessment Patient needs continued PT services  PT Problem List Decreased strength;Decreased range of motion;Decreased activity tolerance;Decreased knowledge of use of DME;Decreased  balance;Decreased safety awareness;Decreased mobility;Decreased knowledge of precautions       PT Treatment Interventions DME instruction;Therapeutic exercise;Gait training;Balance training;Stair training;Functional mobility training;Therapeutic activities;Patient/family education    PT Goals (Current goals can be found in the Care Plan section)  Acute Rehab PT Goals Patient Stated Goal: short term rehab then home PT Goal Formulation: With patient/family Time For Goal Achievement: 07/09/22 Potential to Achieve Goals: Good    Frequency Min 2X/week     Co-evaluation               AM-PAC PT "6 Clicks" Mobility  Outcome Measure Help needed turning from your back to your side while in a flat bed without using bedrails?: A Little Help needed moving from lying on your back to sitting on the side of a flat bed without using bedrails?: A Little Help needed moving to and from a bed to a chair (including a wheelchair)?: A Little Help needed standing up from a chair using your arms (e.g., wheelchair or bedside chair)?: A Lot Help needed to walk in hospital room?: A Lot  Help needed climbing 3-5 steps with a railing? : A Lot 6 Click Score: 15    End of Session Equipment Utilized During Treatment: Gait belt Activity Tolerance: Patient tolerated treatment well Patient left: with chair alarm set;in chair;with call bell/phone within reach Nurse Communication: Mobility status PT Visit Diagnosis: Other abnormalities of gait and mobility (R26.89);Muscle weakness (generalized) (M62.81)    Time: 4818-5909 PT Time Calculation (min) (ACUTE ONLY): 45 min   Charges:   PT Evaluation $PT Eval Low Complexity: 1 Low PT Treatments $Gait Training: 8-22 mins $Therapeutic Activity: 8-22 mins        Abran Richard, PT Acute Rehab [facility] [place] 06/25/2022, 2:52 PM

## 2022-06-26 ENCOUNTER — Inpatient Hospital Stay (HOSPITAL_COMMUNITY): Payer: Medicare Other

## 2022-06-26 DIAGNOSIS — M6282 Rhabdomyolysis: Secondary | ICD-10-CM | POA: Diagnosis not present

## 2022-06-26 DIAGNOSIS — R601 Generalized edema: Secondary | ICD-10-CM | POA: Diagnosis not present

## 2022-06-26 DIAGNOSIS — R7889 Finding of other specified substances, not normally found in blood: Secondary | ICD-10-CM | POA: Diagnosis not present

## 2022-06-26 LAB — CBC
HCT: 46.2 % (ref 39.0–52.0)
HCT: 48.1 % (ref 39.0–52.0)
Hemoglobin: 15 g/dL (ref 13.0–17.0)
Hemoglobin: 15.8 g/dL (ref 13.0–17.0)
MCH: 30.2 pg (ref 26.0–34.0)
MCH: 30 pg (ref 26.0–34.0)
MCHC: 32.5 g/dL (ref 30.0–36.0)
MCHC: 32.8 g/dL (ref 30.0–36.0)
MCV: 93 fL (ref 80.0–100.0)
MCV: 91.4 fL (ref 80.0–100.0)
Platelets: 160 10*3/uL (ref 150–400)
Platelets: 199 10*3/uL (ref 150–400)
RBC: 4.97 MIL/uL (ref 4.22–5.81)
RBC: 5.26 MIL/uL (ref 4.22–5.81)
RDW: 13.3 % (ref 11.5–15.5)
RDW: 13.3 % (ref 11.5–15.5)
WBC: 6.2 10*3/uL (ref 4.0–10.5)
WBC: 7.2 10*3/uL (ref 4.0–10.5)
nRBC: 0 % (ref 0.0–0.2)
nRBC: 0 % (ref 0.0–0.2)

## 2022-06-26 LAB — ECHOCARDIOGRAM COMPLETE
Area-P 1/2: 2.24 cm2
Calc EF: 66.5 %
Height: 69 in
S' Lateral: 2.4 cm
Single Plane A2C EF: 64.9 %
Single Plane A4C EF: 70.4 %
Weight: 3200 oz

## 2022-06-26 LAB — BASIC METABOLIC PANEL
Anion gap: 6 (ref 5–15)
BUN: 19 mg/dL (ref 8–23)
CO2: 23 mmol/L (ref 22–32)
Calcium: 8.3 mg/dL — ABNORMAL LOW (ref 8.9–10.3)
Chloride: 111 mmol/L (ref 98–111)
Creatinine, Ser: 0.63 mg/dL (ref 0.61–1.24)
GFR, Estimated: >60 mL/min (ref >60)
Glucose, Bld: 160 mg/dL — ABNORMAL HIGH (ref 70–99)
Potassium: 4 mmol/L (ref 3.5–5.1)
Sodium: 140 mmol/L (ref 135–145)

## 2022-06-26 LAB — GLUCOSE, CAPILLARY
Glucose-Capillary: 169 mg/dL — ABNORMAL HIGH (ref 70–99)
Glucose-Capillary: 165 mg/dL — ABNORMAL HIGH (ref 70–99)
Glucose-Capillary: 176 mg/dL — ABNORMAL HIGH (ref 70–99)
Glucose-Capillary: 221 mg/dL — ABNORMAL HIGH (ref 70–99)
Glucose-Capillary: 190 mg/dL — ABNORMAL HIGH (ref 70–99)

## 2022-06-26 LAB — MAGNESIUM: Magnesium: 2 mg/dL (ref 1.7–2.4)

## 2022-06-26 LAB — PHOSPHORUS: Phosphorus: 2.4 mg/dL — ABNORMAL LOW (ref 2.5–4.6)

## 2022-06-26 LAB — CK: Total CK: 190 U/L (ref 49–397)

## 2022-06-26 MED ORDER — ASPIRIN 81 MG PO CHEW
81.0000 mg | CHEWABLE_TABLET | Freq: Every day | ORAL | Status: DC
  Administered 2022-06-26 – 2022-07-06 (×11): 81 mg via ORAL
  Filled 2022-06-26 (×12): qty 1

## 2022-06-26 MED ORDER — INSULIN ASPART 100 UNIT/ML IJ SOLN
0.0000 [IU] | Freq: Every day | INTRAMUSCULAR | Status: DC
  Administered 2022-07-03 – 2022-07-04 (×2): 2 [IU] via SUBCUTANEOUS
  Administered 2022-07-05: 3 [IU] via SUBCUTANEOUS
  Administered 2022-07-06: 2 [IU] via SUBCUTANEOUS

## 2022-06-26 MED ORDER — METOPROLOL TARTRATE 5 MG/5ML IV SOLN: 5.0000 mg | INTRAVENOUS | Status: DC | PRN

## 2022-06-26 MED ORDER — HYDRALAZINE HCL 20 MG/ML IJ SOLN: 10.0000 mg | INTRAMUSCULAR | Status: DC | PRN

## 2022-06-26 MED ORDER — GUAIFENESIN 100 MG/5ML PO LIQD: 5.0000 mL | ORAL | Status: DC | PRN

## 2022-06-26 MED ORDER — TRAZODONE HCL 50 MG PO TABS
50.0000 mg | ORAL_TABLET | Freq: Every evening | ORAL | Status: DC | PRN
  Administered 2022-07-01 – 2022-07-03 (×2): 50 mg via ORAL
  Filled 2022-06-26 (×2): qty 1

## 2022-06-26 MED ORDER — CLOPIDOGREL BISULFATE 75 MG PO TABS
75.0000 mg | ORAL_TABLET | Freq: Every day | ORAL | Status: DC
  Administered 2022-06-26 – 2022-07-07 (×12): 75 mg via ORAL
  Filled 2022-06-26 (×12): qty 1

## 2022-06-26 MED ORDER — INSULIN GLARGINE-YFGN 100 UNIT/ML ~~LOC~~ SOLN
10.0000 [IU] | Freq: Every day | SUBCUTANEOUS | Status: DC
  Administered 2022-06-26 – 2022-06-27 (×2): 10 [IU] via SUBCUTANEOUS
  Filled 2022-06-26 (×2): qty 0.1

## 2022-06-26 MED ORDER — INSULIN ASPART 100 UNIT/ML IJ SOLN
0.0000 [IU] | Freq: Three times a day (TID) | INTRAMUSCULAR | Status: DC
  Administered 2022-06-26: 2 [IU] via SUBCUTANEOUS
  Administered 2022-06-26: 3 [IU] via SUBCUTANEOUS
  Administered 2022-06-27: 5 [IU] via SUBCUTANEOUS
  Administered 2022-06-27 (×2): 2 [IU] via SUBCUTANEOUS
  Administered 2022-06-28 (×2): 3 [IU] via SUBCUTANEOUS
  Administered 2022-06-28 – 2022-06-29 (×4): 2 [IU] via SUBCUTANEOUS
  Administered 2022-06-30: 1 [IU] via SUBCUTANEOUS
  Administered 2022-06-30: 2 [IU] via SUBCUTANEOUS
  Administered 2022-06-30: 3 [IU] via SUBCUTANEOUS
  Administered 2022-07-01: 2 [IU] via SUBCUTANEOUS
  Administered 2022-07-01 (×2): 3 [IU] via SUBCUTANEOUS
  Administered 2022-07-02: 5 [IU] via SUBCUTANEOUS
  Administered 2022-07-02: 1 [IU] via SUBCUTANEOUS
  Administered 2022-07-02: 3 [IU] via SUBCUTANEOUS
  Administered 2022-07-03: 5 [IU] via SUBCUTANEOUS
  Administered 2022-07-03: 3 [IU] via SUBCUTANEOUS
  Administered 2022-07-03: 1 [IU] via SUBCUTANEOUS
  Administered 2022-07-04: 2 [IU] via SUBCUTANEOUS
  Administered 2022-07-04: 1 [IU] via SUBCUTANEOUS
  Administered 2022-07-04: 3 [IU] via SUBCUTANEOUS
  Administered 2022-07-05: 1 [IU] via SUBCUTANEOUS
  Administered 2022-07-05: 2 [IU] via SUBCUTANEOUS
  Administered 2022-07-05: 1 [IU] via SUBCUTANEOUS
  Administered 2022-07-06: 3 [IU] via SUBCUTANEOUS
  Administered 2022-07-06: 1 [IU] via SUBCUTANEOUS
  Administered 2022-07-06: 2 [IU] via SUBCUTANEOUS

## 2022-06-26 MED ORDER — K PHOS MONO-SOD PHOS DI & MONO 155-852-130 MG PO TABS
500.0000 mg | ORAL_TABLET | Freq: Once | ORAL | Status: AC
  Administered 2022-06-26: 500 mg via ORAL
  Filled 2022-06-26: qty 2

## 2022-06-26 MED ORDER — OXYCODONE HCL 5 MG PO TABS: 5.0000 mg | ORAL_TABLET | ORAL | Status: DC | PRN

## 2022-06-26 MED ORDER — PERFLUTREN LIPID MICROSPHERE
1.0000 mL | INTRAVENOUS | Status: AC | PRN
  Administered 2022-06-26: 2 mL via INTRAVENOUS

## 2022-06-26 MED ORDER — SENNOSIDES-DOCUSATE SODIUM 8.6-50 MG PO TABS: 1.0000 | ORAL_TABLET | Freq: Every evening | ORAL | Status: DC | PRN

## 2022-06-26 MED ORDER — IPRATROPIUM-ALBUTEROL 0.5-2.5 (3) MG/3ML IN SOLN: 3.0000 mL | RESPIRATORY_TRACT | Status: DC | PRN

## 2022-06-26 NOTE — Progress Notes (Signed)
PROGRESS NOTE    Derrick Wilson  EVO:[id number] DOB: 1934/05/10 DOA: 06/24/2022 PCP: Donnajean Lopes, MD   Brief Narrative:  88 year old with history of DM2, glaucoma, HTN, HLD admitted to the hospital after having multiple falls at home.  He was found to have rhabdomyolysis with CPK greater than 2000, started on IV fluids.  Slowly has rhabdomyolysis resolved but did suffer from bilateral lower extremity swelling secondary to IV fluids.  Echocardiogram was planned by the previous provider.  PT/OT recommended SNF.   Assessment & Plan:  Principal Problem:   Rhabdomyolysis Active Problems:   Unspecified glaucoma   DM2 (diabetes mellitus, type 2) ([place])   Essential hypertension   Falls   HLD (hyperlipidemia)   Rhabdomyolysis, improving This appears to have resolved, CK this morning 190.  Hold off on further IV fluids   Falls No acute signs of trauma.  PT/OT recommending SNF   Elevated troponin, suspect demand ischemia Demand ischemia.  Echocardiogram ordered by previous provider   Bilateral lower extremity edema Elevated BNP Elevated troponin Hold off IV fluids.  Echocardiogram ordered by previous provider   DM2 insulin-dependent, hyperglycemia Hemoglobin A1c 8.0 on 06/24/2022. Will plan to slowly resume his home medication.  Appears to be on metformin, Jardiance, Lantus 30 units daily, glipizide and Ozempic.  He confirms that he takes all of these medications. -Sliding scale and Accu-Cheks, will slowly start with Lantus 10 units daily and further adjust as necessary   HTN -Initially blood pressures were soft therefore home medications were on hold.   Glaucoma -Resume home meds  PT/OT-SNF  DVT prophylaxis: Lovenox Code Status: Full code Family Communication:    Status is: Inpatient SNF placement   Subjective: Seen and examined at bedside, does not any complaints.  Does feel very weak.  Would like to go to skilled nursing facility to help with his  recovery   Examination:  General exam: Appears calm and comfortable  Respiratory system: Clear to auscultation. Respiratory effort normal. Cardiovascular system: S1 & S2 heard, RRR. No JVD, murmurs, rubs, gallops or clicks. No pedal edema. Gastrointestinal system: Abdomen is nondistended, soft and nontender. No organomegaly or masses felt. Normal bowel sounds heard. Central nervous system: Alert and oriented. No focal neurological deficits. Extremities: Symmetric 5 x 5 power. Skin: No rashes, lesions or ulcers Psychiatry: Judgement and insight appear normal. Mood & affect appropriate.     Objective: Vitals:   06/25/22 0522 06/25/22 1517 06/25/22 2051 06/26/22 0442  BP: 133/88 103/78 129/67 (!) 129/58  Pulse: 63 77 63 82  Resp: '18 18 20 18  '$ Temp: 98 F (36.7 C) 97.9 F (36.6 C) (!) 97.5 F (36.4 C) 97.7 F (36.5 C)  TempSrc: Oral Oral Oral Oral  SpO2: 100% 93% 100% 99%  Weight:      Height:        Intake/Output Summary (Last 24 hours) at 06/26/2022 0823 Last data filed at 06/26/2022 0033 Gross per 24 hour  Intake 476 ml  Output 425 ml  Net 51 ml   Filed Weights   06/24/22 0530  Weight: 90.7 kg     Data Reviewed:   CBC: Recent Labs  Lab 06/24/22 0534 06/25/22 0704 06/26/22 0553  WBC 11.7* 7.4 6.2  NEUTROABS 9.2*  --   --   HGB 17.0 15.0 15.0  HCT 52.1* 47.3 46.2  MCV 91.1 92.7 93.0  PLT 224 159 299   Basic Metabolic Panel: Recent Labs  Lab 06/24/22 0534 06/25/22 0704 06/26/22 0553  NA 137  142 140  K 3.8 3.7 4.0  CL 102 109 111  CO2 '24 24 23  '$ GLUCOSE 156* 107* 160*  BUN 22 26* 19  CREATININE 0.82 0.78 0.63  CALCIUM 9.1 8.5* 8.3*  MG  --   --  2.0  PHOS  --   --  2.4*   GFR: Estimated Creatinine Clearance: 73.8 mL/min (by C-G formula based on SCr of 0.63 mg/dL). Liver Function Tests: Recent Labs  Lab 06/24/22 0534 06/25/22 0704  AST 132* 73*  ALT 40 33  ALKPHOS 120 90  BILITOT 1.6* 0.9  PROT 6.7 5.7*  ALBUMIN 3.7 3.1*   No  results for input(s): "LIPASE", "AMYLASE" in the last 168 hours. No results for input(s): "AMMONIA" in the last 168 hours. Coagulation Profile: No results for input(s): "INR", "PROTIME" in the last 168 hours. Cardiac Enzymes: Recent Labs  Lab 06/24/22 0534 06/25/22 0704 06/26/22 0553  CKTOTAL 2,014* 499* 190   BNP (last 3 results) No results for input(s): "PROBNP" in the last 8760 hours. HbA1C: Recent Labs    06/24/22 1647  HGBA1C 8.0*   CBG: Recent Labs  Lab 06/25/22 1201 06/25/22 1621 06/25/22 2155 06/26/22 0520 06/26/22 0739  GLUCAP 197* 225* 209* 169* 165*   Lipid Profile: No results for input(s): "CHOL", "HDL", "LDLCALC", "TRIG", "CHOLHDL", "LDLDIRECT" in the last 72 hours. Thyroid Function Tests: No results for input(s): "TSH", "T4TOTAL", "FREET4", "T3FREE", "THYROIDAB" in the last 72 hours. Anemia Panel: No results for input(s): "VITAMINB12", "FOLATE", "FERRITIN", "TIBC", "IRON", "RETICCTPCT" in the last 72 hours. Sepsis Labs: No results for input(s): "PROCALCITON", "LATICACIDVEN" in the last 168 hours.  No results found for this or any previous visit (from the past 240 hour(s)).       Radiology Studies: CT Lumbar Spine Wo Contrast  Result Date: 06/24/2022 CLINICAL DATA:  Ataxia.  Multiple Falls EXAM: CT LUMBAR SPINE WITHOUT CONTRAST TECHNIQUE: Multidetector CT imaging of the lumbar spine was performed without intravenous contrast administration. Multiplanar CT image reconstructions were also generated. RADIATION DOSE REDUCTION: This exam was performed according to the departmental dose-optimization program which includes automated exposure control, adjustment of the mA and/or kV according to patient size and/or use of iterative reconstruction technique. COMPARISON:  None Available. FINDINGS: Segmentation: 5 lumbar type vertebrae. Alignment: There is trace retrolisthesis of L3 on L4. Grade 1 anterolisthesis of S1 on S2. Vertebrae: There is age indeterminate, but  likely subacute to chronic appearing anterior superior endplate compression deformities at T11, T12, and L1. there are multilevel degenerative endplate changes, most significant at S1-S2 where there is sclerosis and cortical irregularity. There is no evidence of surrounding soft tissue stranding to definitively suggest the presence of discitis osteomyelitis. Paraspinal and other soft tissues: Status post cholecystectomy. Aortic and pelvic vasculature atherosclerotic calcifications. Disc levels: There are multilevel degenerative changes with disc space loss at nearly every lumbar vertebral body level. There is severe left-sided neural foraminal stenosis at L4-L5, L5-S1, and S1-S2. There is severe right-sided neural foraminal stenosis at L3-L4 L4-L5, L5-S1 and S1-S2. There are multilevel degenerative disc bulges without evidence of high-grade spinal canal stenosis. IMPRESSION: 1. Age-indeterminate, but likely subacute to chronic appearing anterior superior endplate compression deformities at T11, T12, and L1. Correlate with point tenderness. 2. Multilevel degenerative changes with disc space loss at nearly every lumbar vertebral body level. Severe left-sided neural foraminal stenosis at L4-L5, L5-S1, and S1-S2. Severe right-sided neural foraminal stenosis at L3-L4, L4-L5, L5-S1, and S1-S2. 3. No evidence of high-grade spinal canal stenosis. Aortic Atherosclerosis (ICD10-I70.0).  Electronically Signed   By: Marin Roberts M.D.   On: 06/24/2022 12:22   CT Head Wo Contrast  Result Date: 06/24/2022 CLINICAL DATA:  Multiple falls.  Evaluate for bleed. EXAM: CT HEAD WITHOUT CONTRAST TECHNIQUE: Contiguous axial images were obtained from the base of the skull through the vertex without intravenous contrast. RADIATION DOSE REDUCTION: This exam was performed according to the departmental dose-optimization program which includes automated exposure control, adjustment of the mA and/or kV according to patient size and/or use of  iterative reconstruction technique. COMPARISON:  Brain CT 07/29/2018 FINDINGS: Brain: Ventricles and sulci are prominent compatible with atrophy. Chronic microvascular ischemic changes. No evidence for acute cortically based infarct, intracranial hemorrhage, mass lesion or mass-effect. Vascular: No hyperdense vessel or unexpected calcification. Skull: Normal. Negative for fracture or focal lesion. Sinuses/Orbits: Paranasal sinuses are well aerated. Mastoid air cells are unremarkable. Other: None IMPRESSION: 1. No acute intracranial process. 2. Atrophy and chronic microvascular ischemic changes. Electronically Signed   By: Lovey Newcomer M.D.   On: 06/24/2022 08:28        Scheduled Meds:  brimonidine  1 drop Both Eyes Once   brimonidine  1 drop Both Eyes QHS   enoxaparin (LOVENOX) injection  40 mg Subcutaneous Q24H   phosphorus  500 mg Oral Once   timolol  1 drop Both Eyes Daily   Continuous Infusions:  sodium chloride 50 mL/hr at 06/25/22 2014     LOS: 2 days   Time spent= 35 mins    Lowell Makara Arsenio Loader, MD [facility]  If 7PM-7AM, please contact night-coverage  06/26/2022, 8:23 AM

## 2022-06-26 NOTE — Evaluation (Signed)
Occupational Therapy Evaluation Patient Details Name: Derrick Wilson MRN: [id number] DOB: 10/04/1933 Today's Date: 06/26/2022   History of Present Illness Pt is 88 yo male admitted 06/24/22 after 2 recent falls with rhabdomyolysis.  Pt has hx of DM, glaucoma, HTN, HLD, leg edema   Clinical Impression   PTA pt living at home @ modified independent level using "hurricane" or RW as needed. Currently requires min a with mobility and ADL tasks @ RW level. Pt would greatly benefit from rehab at SNF to maximize independence with ADL and mobility to facilitate a safe return home. Discussed recommendation of a fall alert system with pt. Pt very appreciative. Acute OT to follow.      Recommendations for follow up therapy are one component of a multi-disciplinary discharge planning process, led by the attending physician.  Recommendations may be updated based on patient status, additional functional criteria and insurance authorization.   Follow Up Recommendations  Skilled nursing-short term rehab (<3 hours/day)     Assistance Recommended at Discharge Frequent or constant Supervision/Assistance  Patient can return home with the following A little help with walking and/or transfers;A little help with bathing/dressing/bathroom;Assistance with cooking/housework;Direct supervision/assist for medications management;Direct supervision/assist for financial management;Assist for transportation;Help with stairs or ramp for entrance    Functional Status Assessment  Patient has had a recent decline in their functional status and demonstrates the ability to make significant improvements in function in a reasonable and predictable amount of time.  Equipment Recommendations  None recommended by OT    Recommendations for Other Services       Precautions / Restrictions Precautions Precautions: Fall      Mobility Bed Mobility Overal bed mobility: Needs Assistance Bed Mobility: Supine to Sit, Sit to  Supine -S                Transfers Overall transfer level: Needs assistance Equipment used: Rolling walker (2 wheels) Transfers: Sit to/from Stand Sit to Stand: Min assist    Cues for hand placement; does well with repetition and simple intructions              Balance Overall balance assessment: Needs assistance, History of Falls Sitting-balance support: No upper extremity supported Sitting balance-Leahy Scale: Good     Standing balance support: Bilateral upper extremity supported Standing balance-Leahy Scale: Poor Standing balance comment: requiring RW                           ADL either performed or assessed with clinical judgement   ADL Overall ADL's : Needs assistance/impaired     Grooming: Set up   Upper Body Bathing: Set up;Sitting   Lower Body Bathing: Minimal assistance;Sit to/from stand   Upper Body Dressing : Set up;Sitting   Lower Body Dressing: Minimal assistance;Sit to/from stand   Toilet Transfer: Minimal assistance;Ambulation   Toileting- Clothing Manipulation and Hygiene: Minimal assistance;Sit to/from stand       Functional mobility during ADLs: Minimal assistance;Rolling walker (2 wheels);Cueing for safety General ADL Comments: able to complete figure four positioning     Vision Baseline Vision/History: 1 Wears glasses       Perception     Praxis      Pertinent Vitals/Pain Pain Assessment Pain Assessment: Faces Faces Pain Scale: Hurts a little bit Pain Location: R knee Pain Descriptors / Indicators: Discomfort Pain Intervention(s): Limited activity within patient's tolerance     Hand Dominance Right   Extremity/Trunk Assessment Upper Extremity Assessment Upper  Extremity Assessment: Generalized weakness; wasting of intrinsics noted B; difficulty opening toothpaste   Lower Extremity Assessment Lower Extremity Assessment: Defer to PT evaluation RLE Deficits / Details: + edema; ROM WFL; MMT: ankle DF 4-/5,  knee ext 5/5, hip flex 4/5 RLE Sensation: WNL LLE Deficits / Details: L footdrop; + edema   Cervical / Trunk Assessment Cervical / Trunk Assessment: Kyphotic   Communication Communication Communication: HOH   Cognition Arousal/Alertness: Awake/alert Behavior During Therapy: WFL for tasks assessed/performed Overall Cognitive Status: No family/caregiver present to determine baseline cognitive functioning                                 General Comments: Decreased insight/awareness; most likely at baseline     General Comments  VSS    Exercises Exercises: General Upper Extremity, General Lower Extremity, Other exercises General Exercises - Upper Extremity Shoulder Flexion: AROM, Both, 10 reps General Exercises - Lower Extremity Quad Sets: 20 reps Short Arc Quad: Strengthening, Both, 20 reps Hip Flexion/Marching: 20 reps, Seated Other Exercises Other Exercises: core strengthening - sittingunsupported in recliner holding both arms at 90 degrees flexion Other Exercises: sit - stand repeat patterns with hand placement on chair x 10   Shoulder Instructions      Home Living Family/patient expects to be discharged to:: Skilled nursing facility (Pt from home but planning to go to [facility] SNF at d/c) Living Arrangements: Spouse/significant other (Spouse recently fx shoulder and at [facility]) Available Help at Discharge: Family;Available PRN/intermittently Type of Home: House Home Access: Stairs to enter Entrance Stairs-Number of Steps: 1   Home Layout: Other (Comment);Two level;Bed/bath upstairs (split level) Alternate Level Stairs-Number of Steps: 5+5; does have recliner on first floor Alternate Level Stairs-Rails: Right Bathroom Shower/Tub: Teacher, early years/pre: Standard     Home Equipment: Conservation officer, nature (2 wheels);Rollator (4 wheels) (Hurri-cane)          Prior Functioning/Environment Prior Level of Function : Independent/Modified  Independent;Driving             Mobility Comments: Occasionally uses a RW or Hurri-cane; could ambulate in community; 2 falls this past week but no other falls ADLs Comments: Independent with adls and iadls        OT Problem List: Decreased strength;Decreased range of motion;Decreased activity tolerance;Impaired balance (sitting and/or standing);Decreased safety awareness;Increased edema      OT Treatment/Interventions: Self-care/ADL training;Therapeutic exercise;DME and/or AE instruction;Therapeutic activities;Patient/family education;Balance training    OT Goals(Current goals can be found in the care plan section) Acute Rehab OT Goals Patient Stated Goal: to get stronger OT Goal Formulation: With patient Time For Goal Achievement: 07/10/22 Potential to Achieve Goals: Good  OT Frequency: Min 2X/week    Co-evaluation              AM-PAC OT "6 Clicks" Daily Activity     Outcome Measure Help from another person eating meals?: None Help from another person taking care of personal grooming?: A Little Help from another person toileting, which includes using toliet, bedpan, or urinal?: A Little Help from another person bathing (including washing, rinsing, drying)?: A Little Help from another person to put on and taking off regular upper body clothing?: A Little Help from another person to put on and taking off regular lower body clothing?: A Little 6 Click Score: 19   End of Session Equipment Utilized During Treatment: Gait belt;Rolling walker (2 wheels) Nurse Communication: Mobility status  Activity  Tolerance: Patient tolerated treatment well Patient left: in chair;with call bell/phone within reach;with chair alarm set  OT Visit Diagnosis: Unsteadiness on feet (R26.81);Muscle weakness (generalized) (M62.81);History of falling (Z91.81)                Time: 4883-0141 OT Time Calculation (min): 32 min Charges:  OT General Charges $OT Visit: 1 Visit OT Evaluation $OT  Eval Moderate Complexity: 1 Mod OT Treatments $Self Care/Home Management : 8-22 mins  Maurie Boettcher, OT/L   Acute OT Clinical Specialist Acute Rehabilitation Services Pager [phone] Office [phone]   [facility] 06/26/2022, 8:40 AM

## 2022-06-26 NOTE — Progress Notes (Signed)
  Echocardiogram 2D Echocardiogram has been performed.  Derrick Wilson 06/26/2022, 11:26 AM

## 2022-06-27 DIAGNOSIS — M6282 Rhabdomyolysis: Secondary | ICD-10-CM

## 2022-06-27 LAB — BASIC METABOLIC PANEL
Anion gap: 8 (ref 5–15)
BUN: 21 mg/dL (ref 8–23)
CO2: 24 mmol/L (ref 22–32)
Calcium: 8.5 mg/dL — ABNORMAL LOW (ref 8.9–10.3)
Chloride: 109 mmol/L (ref 98–111)
Creatinine, Ser: 0.72 mg/dL (ref 0.61–1.24)
GFR, Estimated: >60 mL/min (ref >60)
Glucose, Bld: 162 mg/dL — ABNORMAL HIGH (ref 70–99)
Potassium: 3.9 mmol/L (ref 3.5–5.1)
Sodium: 141 mmol/L (ref 135–145)

## 2022-06-27 LAB — CK: Total CK: 147 U/L (ref 49–397)

## 2022-06-27 LAB — GLUCOSE, CAPILLARY
Glucose-Capillary: 138 mg/dL — ABNORMAL HIGH (ref 70–99)
Glucose-Capillary: 195 mg/dL — ABNORMAL HIGH (ref 70–99)
Glucose-Capillary: 272 mg/dL — ABNORMAL HIGH (ref 70–99)
Glucose-Capillary: 190 mg/dL — ABNORMAL HIGH (ref 70–99)
Glucose-Capillary: 184 mg/dL — ABNORMAL HIGH (ref 70–99)

## 2022-06-27 LAB — MAGNESIUM: Magnesium: 2 mg/dL (ref 1.7–2.4)

## 2022-06-27 MED ORDER — INSULIN GLARGINE-YFGN 100 UNIT/ML ~~LOC~~ SOLN
13.0000 [IU] | Freq: Every day | SUBCUTANEOUS | Status: DC
  Filled 2022-06-27: qty 0.13

## 2022-06-27 MED ORDER — INSULIN ASPART 100 UNIT/ML IJ SOLN
3.0000 [IU] | Freq: Three times a day (TID) | INTRAMUSCULAR | Status: DC
  Administered 2022-06-27 – 2022-07-07 (×29): 3 [IU] via SUBCUTANEOUS

## 2022-06-27 NOTE — Progress Notes (Signed)
PROGRESS NOTE    Derrick Wilson  KWI:[id number] DOB: 1933-12-29 DOA: 06/24/2022 PCP: Donnajean Lopes, MD   Brief Narrative:  88 year old with history of DM2, glaucoma, HTN, HLD admitted to the hospital after having multiple falls at home.  He was found to have rhabdomyolysis with CPK greater than 2000, started on IV fluids.  Slowly has rhabdomyolysis resolved but did suffer from bilateral lower extremity swelling secondary to IV fluids.  Echocardiogram showed preserved EF with grade 1 DD.  PT/OT recommended SNF, awaiting placement.  Assessment & Plan:  Principal Problem:   Rhabdomyolysis Active Problems:   Unspecified glaucoma   DM2 (diabetes mellitus, type 2) ([place])   Essential hypertension   Falls   HLD (hyperlipidemia)   Rhabdomyolysis, improving This appears to have resolved, CK is now normal, holding off on further IV fluids   Falls No acute signs of trauma.  PT/OT recommending SNF   Elevated troponin, suspect demand ischemia Demand ischemia.  Echocardiogram showed preserved EF, grade 1 DD   Bilateral lower extremity edema Elevated BNP Elevated troponin Hold off IV fluids.  Echo is send above.  Advised to elevate legs   DM2 insulin-dependent, hyperglycemia Hemoglobin A1c 8.0 on 06/24/2022. Will plan to slowly resume his home medication.  Appears to be on metformin, Jardiance, Lantus 30 units daily, glipizide and Ozempic.  He confirms that he takes all of these medications. -Sliding scale and Accu-Cheks, increase Lantus to 13 units daily, add NovoLog   HTN -Initially blood pressures were soft therefore home medications were on hold.   Glaucoma -Resume home meds  PT/OT-SNF  DVT prophylaxis: Lovenox Code Status: Full code Family Communication:    Status is: Inpatient SNF placement   Subjective: Doing well no complaints Examination:  Constitutional: Not in acute distress Respiratory: Clear to auscultation bilaterally Cardiovascular: Normal sinus  rhythm, no rubs Abdomen: Nontender nondistended good bowel sounds Musculoskeletal: 2+ bilateral lower extremity pitting edema Skin: No rashes seen Neurologic: CN 2-12 grossly intact.  And nonfocal Psychiatric: Normal judgment and insight. Alert and oriented x 3. Normal mood.  Objective: Vitals:   06/26/22 0442 06/26/22 1511 06/26/22 2039 06/27/22 0524  BP: (!) 129/58 117/68 113/66 (!) 130/50  Pulse: 82 81 69 62  Resp: '18 18 18 18  '$ Temp: 97.7 F (36.5 C) 97.8 F (36.6 C) 97.9 F (36.6 C) 98.2 F (36.8 C)  TempSrc: Oral Oral Oral Oral  SpO2: 99% 94% 97% 98%  Weight:      Height:        Intake/Output Summary (Last 24 hours) at 06/27/2022 1209 Last data filed at 06/27/2022 0946 Gross per 24 hour  Intake 240 ml  Output 600 ml  Net -360 ml   Filed Weights   06/24/22 0530  Weight: 90.7 kg     Data Reviewed:   CBC: Recent Labs  Lab 06/24/22 0534 06/25/22 0704 06/26/22 0553 06/26/22 1017  WBC 11.7* 7.4 6.2 7.2  NEUTROABS 9.2*  --   --   --   HGB 17.0 15.0 15.0 15.8  HCT 52.1* 47.3 46.2 48.1  MCV 91.1 92.7 93.0 91.4  PLT 224 159 160 242   Basic Metabolic Panel: Recent Labs  Lab 06/24/22 0534 06/25/22 0704 06/26/22 0553 06/27/22 0653  NA 137 142 140 141  K 3.8 3.7 4.0 3.9  CL 102 109 111 109  CO2 '24 24 23 24  '$ GLUCOSE 156* 107* 160* 162*  BUN 22 26* 19 21  CREATININE 0.82 0.78 0.63 0.72  CALCIUM 9.1 8.5*  8.3* 8.5*  MG  --   --  2.0 2.0  PHOS  --   --  2.4*  --    GFR: Estimated Creatinine Clearance: 73.8 mL/min (by C-G formula based on SCr of 0.72 mg/dL). Liver Function Tests: Recent Labs  Lab 06/24/22 0534 06/25/22 0704  AST 132* 73*  ALT 40 33  ALKPHOS 120 90  BILITOT 1.6* 0.9  PROT 6.7 5.7*  ALBUMIN 3.7 3.1*   No results for input(s): "LIPASE", "AMYLASE" in the last 168 hours. No results for input(s): "AMMONIA" in the last 168 hours. Coagulation Profile: No results for input(s): "INR", "PROTIME" in the last 168 hours. Cardiac  Enzymes: Recent Labs  Lab 06/24/22 0534 06/25/22 0704 06/26/22 0553 06/27/22 0653  CKTOTAL 2,014* 499* 190 147   BNP (last 3 results) No results for input(s): "PROBNP" in the last 8760 hours. HbA1C: Recent Labs    06/24/22 1647  HGBA1C 8.0*   CBG: Recent Labs  Lab 06/26/22 1730 06/26/22 2042 06/27/22 0527 06/27/22 0754 06/27/22 1121  GLUCAP 221* 190* 138* 195* 272*   Lipid Profile: No results for input(s): "CHOL", "HDL", "LDLCALC", "TRIG", "CHOLHDL", "LDLDIRECT" in the last 72 hours. Thyroid Function Tests: No results for input(s): "TSH", "T4TOTAL", "FREET4", "T3FREE", "THYROIDAB" in the last 72 hours. Anemia Panel: No results for input(s): "VITAMINB12", "FOLATE", "FERRITIN", "TIBC", "IRON", "RETICCTPCT" in the last 72 hours. Sepsis Labs: No results for input(s): "PROCALCITON", "LATICACIDVEN" in the last 168 hours.  No results found for this or any previous visit (from the past 240 hour(s)).       Radiology Studies: ECHOCARDIOGRAM COMPLETE  Result Date: 06/26/2022    ECHOCARDIOGRAM REPORT   Patient Name:   Derrick Wilson Date of Exam: 06/26/2022 Medical Rec #:  [id number]         Height:       69.0 in Accession #:    [id number]        Weight:       200.0 lb Date of Birth:  1933/08/20         BSA:          2.066 m Patient Age:    88 years          BP:           129/58 mmHg Patient Gender: M                 HR:           78 bpm. Exam Location:  Inpatient Procedure: 2D Echo, Color Doppler, Cardiac Doppler and Intracardiac            Opacification Agent Indications:    Elevated troponin  History:        Patient has prior history of Echocardiogram examinations, most                 recent 07/30/2018. Risk Factors:Diabetes, Hypertension and                 Dyslipidemia.  Sonographer:    Eartha Inch Referring Phys: [id number] [place]  Sonographer Comments: Technically difficult study due to poor echo windows and no subcostal window. Image acquisition challenging due to  patient body habitus and Image acquisition challenging due to respiratory motion. IMPRESSIONS  1. Left ventricular ejection fraction, by estimation, is 60 to 65%. The left ventricle has normal function. The left ventricle has no regional wall motion abnormalities. There is mild concentric left ventricular hypertrophy. Left ventricular diastolic parameters are  consistent with Grade I diastolic dysfunction (impaired relaxation).  2. Right ventricular systolic function is normal. The right ventricular size is normal. Tricuspid regurgitation signal is inadequate for assessing PA pressure.  3. The mitral valve is grossly normal. No evidence of mitral valve regurgitation.  4. The aortic valve is tricuspid. There is mild calcification of the aortic valve. There is mild thickening of the aortic valve. Aortic valve regurgitation is not visualized. Aortic valve sclerosis/calcification is present, without any evidence of aortic stenosis.  5. Aortic dilatation noted. There is borderline dilatation of the ascending aorta, measuring 37 mm.  6. The inferior vena cava is normal in size with greater than 50% respiratory variability, suggesting right atrial pressure of 3 mmHg. Comparison(s): No significant change from prior study. FINDINGS  Left Ventricle: Left ventricular ejection fraction, by estimation, is 60 to 65%. The left ventricle has normal function. The left ventricle has no regional wall motion abnormalities. Definity contrast agent was given IV to delineate the left ventricular  endocardial borders. The left ventricular internal cavity size was normal in size. There is mild concentric left ventricular hypertrophy. Left ventricular diastolic parameters are consistent with Grade I diastolic dysfunction (impaired relaxation). Right Ventricle: The right ventricular size is normal. No increase in right ventricular wall thickness. Right ventricular systolic function is normal. Tricuspid regurgitation signal is inadequate for  assessing PA pressure. Left Atrium: Left atrial size was normal in size. Right Atrium: Right atrial size was normal in size. Pericardium: There is no evidence of pericardial effusion. Mitral Valve: The mitral valve is grossly normal. There is mild thickening of the mitral valve leaflet(s). There is mild calcification of the mitral valve leaflet(s). No evidence of mitral valve regurgitation. Tricuspid Valve: The tricuspid valve is normal in structure. Tricuspid valve regurgitation is trivial. Aortic Valve: The aortic valve is tricuspid. There is mild calcification of the aortic valve. There is mild thickening of the aortic valve. Aortic valve regurgitation is not visualized. Aortic valve sclerosis/calcification is present, without any evidence of aortic stenosis. Pulmonic Valve: The pulmonic valve was normal in structure. Pulmonic valve regurgitation is trivial. Aorta: Aortic dilatation noted. There is borderline dilatation of the ascending aorta, measuring 37 mm. Venous: The inferior vena cava is normal in size with greater than 50% respiratory variability, suggesting right atrial pressure of 3 mmHg. IAS/Shunts: The atrial septum is grossly normal.  LEFT VENTRICLE PLAX 2D LVIDd:         3.90 cm     Diastology LVIDs:         2.40 cm     LV e' medial:    4.46 cm/s LV PW:         1.10 cm     LV E/e' medial:  15.3 LV IVS:        1.10 cm     LV e' lateral:   7.07 cm/s LVOT diam:     2.10 cm     LV E/e' lateral: 9.6 LV SV:         65 LV SV Index:   31 LVOT Area:     3.46 cm  LV Volumes (MOD) LV vol d, MOD A2C: 30.5 ml LV vol d, MOD A4C: 65.9 ml LV vol s, MOD A2C: 10.7 ml LV vol s, MOD A4C: 19.5 ml LV SV MOD A2C:     19.8 ml LV SV MOD A4C:     65.9 ml LV SV MOD BP:      30.1 ml RIGHT VENTRICLE RV S  prime:     12.90 cm/s TAPSE (M-mode): 2.3 cm LEFT ATRIUM             Index        RIGHT ATRIUM           Index LA diam:        3.80 cm 1.84 cm/m   RA Area:     19.50 cm LA Vol (A2C):   41.5 ml 20.09 ml/m  RA Volume:   54.40  ml  26.33 ml/m LA Vol (A4C):   47.0 ml 22.75 ml/m LA Biplane Vol: 47.4 ml 22.94 ml/m  AORTIC VALVE LVOT Vmax:   89.40 cm/s LVOT Vmean:  70.100 cm/s LVOT VTI:    0.187 m  AORTA Ao Root diam: 3.30 cm Ao Asc diam:  3.25 cm MITRAL VALVE MV Area (PHT): 2.24 cm     SHUNTS MV Decel Time: 338 msec     Systemic VTI:  0.19 m MV E velocity: 68.10 cm/s   Systemic Diam: 2.10 cm MV A velocity: 102.00 cm/s MV E/A ratio:  0.67 Gwyndolyn Kaufman MD Electronically signed by Gwyndolyn Kaufman MD Signature Date/Time: 06/26/2022/12:59:48 PM    Final         Scheduled Meds:  aspirin  81 mg Oral QHS   brimonidine  1 drop Both Eyes Once   clopidogrel  75 mg Oral Daily   enoxaparin (LOVENOX) injection  40 mg Subcutaneous Q24H   insulin aspart  0-5 Units Subcutaneous QHS   insulin aspart  0-9 Units Subcutaneous TID WC   insulin glargine-yfgn  10 Units Subcutaneous Daily   timolol  1 drop Both Eyes Daily   Continuous Infusions:     LOS: 3 days   Time spent= 35 mins    Marcell Chavarin Arsenio Loader, MD [facility]  If 7PM-7AM, please contact night-coverage  06/27/2022, 12:09 PM

## 2022-06-27 NOTE — Care Management Important Message (Signed)
Important Message  Patient Details IM Letter placed in Patient's room Name: JAYVAN MCSHAN MRN: [id number] Date of Birth: [birth year]   Medicare Important Message Given:  Yes     Kerin Salen 06/27/2022, 9:25 AM

## 2022-06-27 NOTE — Progress Notes (Signed)
Mobility Specialist - Progress Note   06/27/22 1328  Mobility  Activity Ambulated with assistance in hallway  Level of Assistance Contact guard assist, steadying assist  Assistive Device Front wheel walker  Distance Ambulated (ft) 400 ft  Activity Response Tolerated well  Mobility Referral Yes  $Mobility charge 1 Mobility   Pt received in chair and agreed to mobility, pt had 2 instances of LOB due to L foot drop, may have been due to kicking the back leg of the walker. Pt returned to chair with all needs met and alarm on.   Roderick Pee Mobility Specialist

## 2022-06-28 LAB — BASIC METABOLIC PANEL
Anion gap: 7 (ref 5–15)
BUN: 20 mg/dL (ref 8–23)
CO2: 25 mmol/L (ref 22–32)
Calcium: 8.3 mg/dL — ABNORMAL LOW (ref 8.9–10.3)
Chloride: 107 mmol/L (ref 98–111)
Creatinine, Ser: 0.6 mg/dL — ABNORMAL LOW (ref 0.61–1.24)
GFR, Estimated: >60 mL/min (ref >60)
Glucose, Bld: 178 mg/dL — ABNORMAL HIGH (ref 70–99)
Potassium: 3.7 mmol/L (ref 3.5–5.1)
Sodium: 139 mmol/L (ref 135–145)

## 2022-06-28 LAB — GLUCOSE, CAPILLARY
Glucose-Capillary: 172 mg/dL — ABNORMAL HIGH (ref 70–99)
Glucose-Capillary: 222 mg/dL — ABNORMAL HIGH (ref 70–99)
Glucose-Capillary: 225 mg/dL — ABNORMAL HIGH (ref 70–99)
Glucose-Capillary: 84 mg/dL (ref 70–99)

## 2022-06-28 LAB — MAGNESIUM: Magnesium: 1.7 mg/dL (ref 1.7–2.4)

## 2022-06-28 LAB — CK: Total CK: 88 U/L (ref 49–397)

## 2022-06-28 MED ORDER — INSULIN GLARGINE-YFGN 100 UNIT/ML ~~LOC~~ SOLN
16.0000 [IU] | Freq: Every day | SUBCUTANEOUS | Status: DC
  Administered 2022-06-28 – 2022-07-07 (×10): 16 [IU] via SUBCUTANEOUS
  Filled 2022-06-28 (×12): qty 0.16

## 2022-06-28 NOTE — Progress Notes (Signed)
Mobility Specialist - Progress Note   06/28/22 0915  Mobility  Activity Ambulated with assistance in hallway  Level of Assistance Contact guard assist, steadying assist  Assistive Device Front wheel walker  Distance Ambulated (ft) 425 ft  Activity Response Tolerated well  Mobility Referral Yes  $Mobility charge 1 Mobility   Pt received in chair and agreed to mobility, had no c/o pain nor discomfort during session, only one LOB instance, left foot did not move when he "told it to". Pt returned to chair with all needs met and alarm on.   Roderick Pee Mobility Specialist

## 2022-06-28 NOTE — NC FL2 (Signed)
[place] LEVEL OF CARE FORM     IDENTIFICATION  Patient Name: Derrick Wilson Birthdate: 1933/12/30 Sex: male Admission Date (Current Location): 06/24/2022  [facility] and [place] Number:  Herbalist and Address:  [facility],  [address] [place], [place]      Provider Number: [id number]  Attending Physician Name and Address:  Damita Lack, MD  Relative Name and Phone Number:  Truitt Cruey [phone]    Current Level of Care: Hospital Recommended Level of Care: [place] Prior Approval Number:    Date Approved/Denied:   PASRR Number: [id number]  Discharge Plan: SNF    Current Diagnoses: Patient Active Problem List   Diagnosis Date Noted   Rhabdomyolysis 06/24/2022   Falls 06/24/2022   HLD (hyperlipidemia) 06/24/2022   Mass of hand, left 02/18/2019   Atrophy of muscle of left hand 02/18/2019   TIA (transient ischemic attack) 07/29/2018   DM2 (diabetes mellitus, type 2) ([place]) 07/29/2018   Essential hypertension 07/29/2018   DIABETES MELLITUS, TYPE II 12/06/2006   Unspecified glaucoma 12/06/2006   BENIGN PROSTATIC HYPERTROPHY 12/06/2006    Orientation RESPIRATION BLADDER Height & Weight     Self, Time, Situation, Place  Normal Continent Weight: 90.7 kg Height:  '5\' 9"'$  (175.3 cm)  BEHAVIORAL SYMPTOMS/MOOD NEUROLOGICAL BOWEL NUTRITION STATUS   (n/a)  (n/a) Continent Diet  AMBULATORY STATUS COMMUNICATION OF NEEDS Skin   Limited Assist Verbally Skin abrasions (abrasions to knee and elbow bilateral)                       Personal Care Assistance Level of Assistance  Bathing, Feeding, Dressing Bathing Assistance: Limited assistance Feeding assistance: Independent (set up) Dressing Assistance: Limited assistance     Functional Limitations Info  Sight, Hearing, Speech Sight Info: Adequate Hearing Info: Adequate Speech Info: Adequate    SPECIAL CARE FACTORS FREQUENCY  PT (By  licensed PT), OT (By licensed OT)     PT Frequency: 5X/wk OT Frequency: 5X./wk            Contractures Contractures Info: Not present    Additional Factors Info  Code Status, Allergies, Psychotropic, Insulin Sliding Scale, Isolation Precautions, Suctioning Needs Code Status Info: Full Allergies Info: No Known Allergies Psychotropic Info: n/a Insulin Sliding Scale Info: see discharge summary Isolation Precautions Info: n/a Suctioning Needs: n/a   Current Medications (06/28/2022):  This is the current hospital active medication list Current Facility-Administered Medications  Medication Dose Route Frequency Provider Last Rate Last Admin   acetaminophen (TYLENOL) tablet 650 mg  650 mg Oral Q6H PRN Marylyn Ishihara, Tyrone A, DO       Or   acetaminophen (TYLENOL) suppository 650 mg  650 mg Rectal Q6H PRN Marylyn Ishihara, Tyrone A, DO       aspirin chewable tablet 81 mg  81 mg Oral QHS Amin, Ankit Chirag, MD   81 mg at 06/27/22 2144   brimonidine (ALPHAGAN) 0.15 % ophthalmic solution 1 drop  1 drop Both Eyes Once Raenette Rover, NP       clopidogrel (PLAVIX) tablet 75 mg  75 mg Oral Daily Amin, Ankit Chirag, MD   75 mg at 06/28/22 1016   enoxaparin (LOVENOX) injection 40 mg  40 mg Subcutaneous Q24H Kyle, Tyrone A, DO   40 mg at 06/27/22 1706   guaiFENesin (ROBITUSSIN) 100 MG/5ML liquid 5 mL  5 mL Oral Q4H PRN Amin, Ankit Chirag, MD       hydrALAZINE (APRESOLINE)  injection 10 mg  10 mg Intravenous Q4H PRN Amin, Ankit Chirag, MD       insulin aspart (novoLOG) injection 0-5 Units  0-5 Units Subcutaneous QHS Amin, Ankit Chirag, MD       insulin aspart (novoLOG) injection 0-9 Units  0-9 Units Subcutaneous TID WC Amin, Ankit Chirag, MD   3 Units at 06/28/22 1200   insulin aspart (novoLOG) injection 3 Units  3 Units Subcutaneous TID WC Amin, Ankit Chirag, MD   3 Units at 06/28/22 1200   insulin glargine-yfgn (SEMGLEE) injection 16 Units  16 Units Subcutaneous Daily Amin, Ankit Chirag, MD   16 Units at 06/28/22 1016    ipratropium-albuterol (DUONEB) 0.5-2.5 (3) MG/3ML nebulizer solution 3 mL  3 mL Nebulization Q4H PRN Amin, Ankit Chirag, MD       metoprolol tartrate (LOPRESSOR) injection 5 mg  5 mg Intravenous Q4H PRN Amin, Ankit Chirag, MD       oxyCODONE (Oxy IR/ROXICODONE) immediate release tablet 5 mg  5 mg Oral Q4H PRN Amin, Ankit Chirag, MD       senna-docusate (Senokot-S) tablet 1 tablet  1 tablet Oral QHS PRN Amin, Ankit Chirag, MD       timolol (TIMOPTIC) 0.25 % ophthalmic solution 1 drop  1 drop Both Eyes Daily Raenette Rover, NP   1 drop at 06/28/22 1017   traZODone (DESYREL) tablet 50 mg  50 mg Oral QHS PRN Damita Lack, MD         Discharge Medications: Please see discharge summary for a list of discharge medications.  Relevant Imaging Results:  Relevant Lab Results:   Additional Information SS# [id number]  Angelita Ingles, RN

## 2022-06-28 NOTE — Progress Notes (Signed)
PROGRESS NOTE    Derrick Wilson  XKG:[id number] DOB: [birth year] DOA: 06/24/2022 PCP: Donnajean Lopes, MD   Brief Narrative:  88 year old with history of DM2, glaucoma, HTN, HLD admitted to the hospital after having multiple falls at home.  He was found to have rhabdomyolysis with CPK greater than 2000, started on IV fluids.  Slowly has rhabdomyolysis resolved but did suffer from bilateral lower extremity swelling secondary to IV fluids.  Echocardiogram showed preserved EF with grade 1 DD.  PT/OT recommended SNF, awaiting placement.  Assessment & Plan:  Principal Problem:   Rhabdomyolysis Active Problems:   Unspecified glaucoma   DM2 (diabetes mellitus, type 2) ([place])   Essential hypertension   Falls   HLD (hyperlipidemia)   Rhabdomyolysis, improving Resolved with IV fluids   Falls No acute signs of trauma.  PT/OT recommending SNF, TOC working on placement   Elevated troponin, suspect demand ischemia Demand ischemia.  Echocardiogram showed preserved EF, grade 1 DD   Bilateral lower extremity edema Elevated BNP Elevated troponin Hold off IV fluids.  Echo i as above.  Advised to elevate legs   DM2 insulin-dependent, hyperglycemia Hemoglobin A1c 8.0 on 06/24/2022. Will plan to slowly resume his home medication.  Appears to be on metformin, Jardiance, Lantus 30 units daily, glipizide and Ozempic.  He confirms that he takes all of these medications. -Sliding scale and Accu-Cheks, Premeal NovoLog, increase Lantus to 16 units daily   HTN -Initially blood pressures were soft therefore home medications were on hold.   Glaucoma -Resume home meds  PT/OT-SNF  DVT prophylaxis: Lovenox Code Status: Full code Family Communication:    Status is: Inpatient SNF placement   Subjective: Ambulating well in the hallway with the help of therapy staff.  Overall still feels weak but no other acute events overnight.  Very motivated to get and participate in  therapy  Examination: Constitutional: Not in acute distress Respiratory: Clear to auscultation bilaterally Cardiovascular: Normal sinus rhythm, no rubs Abdomen: Nontender nondistended good bowel sounds Musculoskeletal: 1+ bilateral lower extremity pitting edema Skin: No rashes seen Neurologic: CN 2-12 grossly intact.  And nonfocal Psychiatric: Normal judgment and insight. Alert and oriented x 3. Normal mood.   Objective: Vitals:   06/27/22 0524 06/27/22 1402 06/27/22 2015 06/28/22 0447  BP: (!) 130/50 (!) 116/94 118/65 130/75  Pulse: 62 74 70 73  Resp: '18 20 16 18  '$ Temp: 98.2 F (36.8 C) 98.7 F (37.1 C) 97.9 F (36.6 C) 97.9 F (36.6 C)  TempSrc: Oral Oral Oral Oral  SpO2: 98% 97% 94% 97%  Weight:      Height:        Intake/Output Summary (Last 24 hours) at 06/28/2022 0823 Last data filed at 06/28/2022 7026 Gross per 24 hour  Intake 240 ml  Output 900 ml  Net -660 ml   Filed Weights   06/24/22 0530  Weight: 90.7 kg     Data Reviewed:   CBC: Recent Labs  Lab 06/24/22 0534 06/25/22 0704 06/26/22 0553 06/26/22 1017  WBC 11.7* 7.4 6.2 7.2  NEUTROABS 9.2*  --   --   --   HGB 17.0 15.0 15.0 15.8  HCT 52.1* 47.3 46.2 48.1  MCV 91.1 92.7 93.0 91.4  PLT 224 159 160 378   Basic Metabolic Panel: Recent Labs  Lab 06/24/22 0534 06/25/22 0704 06/26/22 0553 06/27/22 0653 06/28/22 0545  NA 137 142 140 141 139  K 3.8 3.7 4.0 3.9 3.7  CL 102 109 111 109 107  CO2  $'24 24 23 24 25  'j$ GLUCOSE 156* 107* 160* 162* 178*  BUN 22 26* '19 21 20  '$ CREATININE 0.82 0.78 0.63 0.72 0.60*  CALCIUM 9.1 8.5* 8.3* 8.5* 8.3*  MG  --   --  2.0 2.0 1.7  PHOS  --   --  2.4*  --   --    GFR: Estimated Creatinine Clearance: 73.8 mL/min (A) (by C-G formula based on SCr of 0.6 mg/dL (L)). Liver Function Tests: Recent Labs  Lab 06/24/22 0534 06/25/22 0704  AST 132* 73*  ALT 40 33  ALKPHOS 120 90  BILITOT 1.6* 0.9  PROT 6.7 5.7*  ALBUMIN 3.7 3.1*   No results for input(s):  "LIPASE", "AMYLASE" in the last 168 hours. No results for input(s): "AMMONIA" in the last 168 hours. Coagulation Profile: No results for input(s): "INR", "PROTIME" in the last 168 hours. Cardiac Enzymes: Recent Labs  Lab 06/24/22 0534 06/25/22 0704 06/26/22 0553 06/27/22 0653 06/28/22 0545  CKTOTAL 2,014* 499* 190 147 88   BNP (last 3 results) No results for input(s): "PROBNP" in the last 8760 hours. HbA1C: No results for input(s): "HGBA1C" in the last 72 hours.  CBG: Recent Labs  Lab 06/27/22 0754 06/27/22 1121 06/27/22 1637 06/27/22 2115 06/28/22 0737  GLUCAP 195* 272* 190* 184* 172*   Lipid Profile: No results for input(s): "CHOL", "HDL", "LDLCALC", "TRIG", "CHOLHDL", "LDLDIRECT" in the last 72 hours. Thyroid Function Tests: No results for input(s): "TSH", "T4TOTAL", "FREET4", "T3FREE", "THYROIDAB" in the last 72 hours. Anemia Panel: No results for input(s): "VITAMINB12", "FOLATE", "FERRITIN", "TIBC", "IRON", "RETICCTPCT" in the last 72 hours. Sepsis Labs: No results for input(s): "PROCALCITON", "LATICACIDVEN" in the last 168 hours.  No results found for this or any previous visit (from the past 240 hour(s)).       Radiology Studies: ECHOCARDIOGRAM COMPLETE  Result Date: 06/26/2022    ECHOCARDIOGRAM REPORT   Patient Name:   AIDRIC ENDICOTT Date of Exam: 06/26/2022 Medical Rec #:  [id number]         Height:       69.0 in Accession #:    [id number]        Weight:       200.0 lb Date of Birth:  [birth year]         BSA:          2.066 m Patient Age:    12 years          BP:           129/58 mmHg Patient Gender: M                 HR:           78 bpm. Exam Location:  Inpatient Procedure: 2D Echo, Color Doppler, Cardiac Doppler and Intracardiac            Opacification Agent Indications:    Elevated troponin  History:        Patient has prior history of Echocardiogram examinations, most                 recent 07/30/2018. Risk Factors:Diabetes, Hypertension and                  Dyslipidemia.  Sonographer:    Eartha Inch Referring Phys: [id number] [place]  Sonographer Comments: Technically difficult study due to poor echo windows and no subcostal window. Image acquisition challenging due to patient body habitus and Image acquisition challenging due to respiratory motion. IMPRESSIONS  1. Left ventricular ejection fraction, by estimation, is 60 to 65%. The left ventricle has normal function. The left ventricle has no regional wall motion abnormalities. There is mild concentric left ventricular hypertrophy. Left ventricular diastolic parameters are consistent with Grade I diastolic dysfunction (impaired relaxation).  2. Right ventricular systolic function is normal. The right ventricular size is normal. Tricuspid regurgitation signal is inadequate for assessing PA pressure.  3. The mitral valve is grossly normal. No evidence of mitral valve regurgitation.  4. The aortic valve is tricuspid. There is mild calcification of the aortic valve. There is mild thickening of the aortic valve. Aortic valve regurgitation is not visualized. Aortic valve sclerosis/calcification is present, without any evidence of aortic stenosis.  5. Aortic dilatation noted. There is borderline dilatation of the ascending aorta, measuring 37 mm.  6. The inferior vena cava is normal in size with greater than 50% respiratory variability, suggesting right atrial pressure of 3 mmHg. Comparison(s): No significant change from prior study. FINDINGS  Left Ventricle: Left ventricular ejection fraction, by estimation, is 60 to 65%. The left ventricle has normal function. The left ventricle has no regional wall motion abnormalities. Definity contrast agent was given IV to delineate the left ventricular  endocardial borders. The left ventricular internal cavity size was normal in size. There is mild concentric left ventricular hypertrophy. Left ventricular diastolic parameters are consistent with Grade I diastolic dysfunction  (impaired relaxation). Right Ventricle: The right ventricular size is normal. No increase in right ventricular wall thickness. Right ventricular systolic function is normal. Tricuspid regurgitation signal is inadequate for assessing PA pressure. Left Atrium: Left atrial size was normal in size. Right Atrium: Right atrial size was normal in size. Pericardium: There is no evidence of pericardial effusion. Mitral Valve: The mitral valve is grossly normal. There is mild thickening of the mitral valve leaflet(s). There is mild calcification of the mitral valve leaflet(s). No evidence of mitral valve regurgitation. Tricuspid Valve: The tricuspid valve is normal in structure. Tricuspid valve regurgitation is trivial. Aortic Valve: The aortic valve is tricuspid. There is mild calcification of the aortic valve. There is mild thickening of the aortic valve. Aortic valve regurgitation is not visualized. Aortic valve sclerosis/calcification is present, without any evidence of aortic stenosis. Pulmonic Valve: The pulmonic valve was normal in structure. Pulmonic valve regurgitation is trivial. Aorta: Aortic dilatation noted. There is borderline dilatation of the ascending aorta, measuring 37 mm. Venous: The inferior vena cava is normal in size with greater than 50% respiratory variability, suggesting right atrial pressure of 3 mmHg. IAS/Shunts: The atrial septum is grossly normal.  LEFT VENTRICLE PLAX 2D LVIDd:         3.90 cm     Diastology LVIDs:         2.40 cm     LV e' medial:    4.46 cm/s LV PW:         1.10 cm     LV E/e' medial:  15.3 LV IVS:        1.10 cm     LV e' lateral:   7.07 cm/s LVOT diam:     2.10 cm     LV E/e' lateral: 9.6 LV SV:         65 LV SV Index:   31 LVOT Area:     3.46 cm  LV Volumes (MOD) LV vol d, MOD A2C: 30.5 ml LV vol d, MOD A4C: 65.9 ml LV vol s, MOD A2C: 10.7 ml LV vol s, MOD  A4C: 19.5 ml LV SV MOD A2C:     19.8 ml LV SV MOD A4C:     65.9 ml LV SV MOD BP:      30.1 ml RIGHT VENTRICLE RV S  prime:     12.90 cm/s TAPSE (M-mode): 2.3 cm LEFT ATRIUM             Index        RIGHT ATRIUM           Index LA diam:        3.80 cm 1.84 cm/m   RA Area:     19.50 cm LA Vol (A2C):   41.5 ml 20.09 ml/m  RA Volume:   54.40 ml  26.33 ml/m LA Vol (A4C):   47.0 ml 22.75 ml/m LA Biplane Vol: 47.4 ml 22.94 ml/m  AORTIC VALVE LVOT Vmax:   89.40 cm/s LVOT Vmean:  70.100 cm/s LVOT VTI:    0.187 m  AORTA Ao Root diam: 3.30 cm Ao Asc diam:  3.25 cm MITRAL VALVE MV Area (PHT): 2.24 cm     SHUNTS MV Decel Time: 338 msec     Systemic VTI:  0.19 m MV E velocity: 68.10 cm/s   Systemic Diam: 2.10 cm MV A velocity: 102.00 cm/s MV E/A ratio:  0.67 Gwyndolyn Kaufman MD Electronically signed by Gwyndolyn Kaufman MD Signature Date/Time: 06/26/2022/12:59:48 PM    Final         Scheduled Meds:  aspirin  81 mg Oral QHS   brimonidine  1 drop Both Eyes Once   clopidogrel  75 mg Oral Daily   enoxaparin (LOVENOX) injection  40 mg Subcutaneous Q24H   insulin aspart  0-5 Units Subcutaneous QHS   insulin aspart  0-9 Units Subcutaneous TID WC   insulin aspart  3 Units Subcutaneous TID WC   insulin glargine-yfgn  13 Units Subcutaneous Daily   timolol  1 drop Both Eyes Daily   Continuous Infusions:     LOS: 4 days   Time spent= 35 mins    Sharrie Self Arsenio Loader, MD [facility]  If 7PM-7AM, please contact night-coverage  06/28/2022, 8:23 AM

## 2022-06-28 NOTE — TOC Progression Note (Signed)
Transition of Care [facility]) - Progression Note    Patient Details  Name: ANDREN BETHEA MRN: [id number] Date of Birth: 11/17/1933  Transition of Care [facility]) CM/SW [place], RN Phone Number:[phone]  06/28/2022, 3:02 PM  Clinical Narrative:    PASRR # [id number], FL2  completed and faxed to [facility]. [place] admissions coordinator at [facility] has initiated [employer]. Per [place] facility can take patient and is currently arranging to place patient and his wife in the same room. Insurance Josem Kaufmann is pending. TOC will continue to follow.         Expected Discharge Plan and Services                                                 Social Determinants of Health (SDOH) Interventions    Readmission Risk Interventions     No data to display

## 2022-06-29 LAB — BASIC METABOLIC PANEL
Anion gap: 7 (ref 5–15)
BUN: 21 mg/dL (ref 8–23)
CO2: 25 mmol/L (ref 22–32)
Calcium: 8.5 mg/dL — ABNORMAL LOW (ref 8.9–10.3)
Chloride: 107 mmol/L (ref 98–111)
Creatinine, Ser: 0.64 mg/dL (ref 0.61–1.24)
GFR, Estimated: >60 mL/min (ref >60)
Glucose, Bld: 142 mg/dL — ABNORMAL HIGH (ref 70–99)
Potassium: 4.1 mmol/L (ref 3.5–5.1)
Sodium: 139 mmol/L (ref 135–145)

## 2022-06-29 LAB — CBC
HCT: 42.9 % (ref 39.0–52.0)
Hemoglobin: 14.2 g/dL (ref 13.0–17.0)
MCH: 30.1 pg (ref 26.0–34.0)
MCHC: 33.1 g/dL (ref 30.0–36.0)
MCV: 91.1 fL (ref 80.0–100.0)
Platelets: 165 10*3/uL (ref 150–400)
RBC: 4.71 MIL/uL (ref 4.22–5.81)
RDW: 13.2 % (ref 11.5–15.5)
WBC: 6.5 10*3/uL (ref 4.0–10.5)
nRBC: 0 % (ref 0.0–0.2)

## 2022-06-29 LAB — MAGNESIUM: Magnesium: 1.9 mg/dL (ref 1.7–2.4)

## 2022-06-29 LAB — CK: Total CK: 64 U/L (ref 49–397)

## 2022-06-29 LAB — GLUCOSE, CAPILLARY
Glucose-Capillary: 128 mg/dL — ABNORMAL HIGH (ref 70–99)
Glucose-Capillary: 197 mg/dL — ABNORMAL HIGH (ref 70–99)
Glucose-Capillary: 213 mg/dL — ABNORMAL HIGH (ref 70–99)
Glucose-Capillary: 174 mg/dL — ABNORMAL HIGH (ref 70–99)
Glucose-Capillary: 124 mg/dL — ABNORMAL HIGH (ref 70–99)

## 2022-06-29 MED ORDER — INSULIN GLARGINE 100 UNIT/ML ~~LOC~~ SOLN: 16.0000 [IU] | Freq: Every day | SUBCUTANEOUS | 0 refills | Status: AC

## 2022-06-29 NOTE — Hospital Course (Addendum)
88 year old with history of DM2, glaucoma, HTN, HLD admitted to the hospital after having multiple falls at home. He was found to have rhabdomyolysis with CPK greater than 2000, started on IV fluids. Slowly has rhabdomyolysis resolved but did suffer from bilateral lower extremity swelling secondary to IV fluids. Echocardiogram showed preserved EF with grade 1 DD. PT/OT recommended SNF, awaiting placement> insurance denied, family has appealed 07/04/22

## 2022-06-29 NOTE — TOC Progression Note (Signed)
Transition of Care [facility]) - Progression Note    Patient Details  Name: Derrick Wilson MRN: [id number] Date of Birth: 04-17-1934  Transition of Care [facility]) CM/SW Contact  Henrietta Dine, RN Phone Number: [phone] 06/29/2022, 3:33 PM  Clinical Narrative:    Attempted to obtain status of ins auth; LM for [place] at [place]; awaiting return call.  [phone]- received call from [place]; she says still awaiting ins auth; she also says she will contact TOC as soon as she receives update.        Expected Discharge Plan and Services                                                 Social Determinants of Health (SDOH) Interventions    Readmission Risk Interventions     No data to display

## 2022-06-29 NOTE — TOC Progression Note (Signed)
Transition of Care [facility]) - Progression Note    Patient Details  Name: Derrick Wilson MRN: [id number] Date of Birth: 09-01-1933  Transition of Care [facility]) CM/SW [place], RN Phone Number:[phone]  06/29/2022, 12:08 PM  Clinical Narrative:    Cm spoke with [facility] admissions coordinator at [facility]. Insurance auth pending.         Expected Discharge Plan and Services                                                 Social Determinants of Health (SDOH) Interventions    Readmission Risk Interventions     No data to display

## 2022-06-29 NOTE — Progress Notes (Signed)
Physical Therapy Treatment Patient Details Name: Derrick Wilson MRN: [id number] DOB: 10-18-1933 Today's Date: 06/29/2022   History of Present Illness Pt is 88 yo male admitted 06/24/22 after 2 recent falls with rhabdomyolysis.  Pt has hx of DM, glaucoma, HTN, HLD, leg edema    PT Comments    Patient resting in recliner at start of session and eager to mobilize with therapy. Pt required min guard for safe sit<>stand and required bil UE's to initiate power up. During ambulation pt required min guard predominately however had 4 episodes of LOB due to Lt foot drop. Ankle wrap applied to facilitate Lt dorsiflexion and improved pt's foot clearance. Discussed AFO's with pt and will place order for AFO to improve pt's balance and reduce fall risk with gait. Initiated stair training for LE strengthening and functional mobility training. Pt requires bil UE support and min-mod assist to steady with rise to step up. EOS pt resting in recliner and all needs met. Continue to recommend ST rehab at SNF prior to return home to reduce fall risk and improve independence with mobility. Will continue to progress as able.   Recommendations for follow up therapy are one component of a multi-disciplinary discharge planning process, led by the attending physician.  Recommendations may be updated based on patient status, additional functional criteria and insurance authorization.  Follow Up Recommendations  Skilled nursing-short term rehab (<3 hours/day) Can patient physically be transported by private vehicle: Yes   Assistance Recommended at Discharge Intermittent Supervision/Assistance  Patient can return home with the following     Equipment Recommendations  Rolling walker (2 wheels)    Recommendations for Other Services       Precautions / Restrictions Precautions Precautions: Fall Restrictions Weight Bearing Restrictions: No     Mobility  Bed Mobility               General bed mobility  comments: Pt OOB in recliner    Transfers Overall transfer level: Needs assistance Equipment used: Rolling walker (2 wheels) Transfers: Sit to/from Stand Sit to Stand: Min guard           General transfer comment: pt using bil UE to power up from recliner, no assist for rise, guarding for safety.    Ambulation/Gait Ambulation/Gait assistance: Min assist Gait Distance (Feet): 300 Feet Assistive device: Rolling walker (2 wheels) Gait Pattern/deviations: Step-through pattern, Decreased stride length, Decreased dorsiflexion - right, Decreased dorsiflexion - left Gait velocity: decr         Stairs Stairs: Yes Stairs assistance: Mod assist Stair Management: One rail Right, Step to pattern, Sideways Number of Stairs: 1 (3x1)     Wheelchair Mobility    Modified Rankin (Stroke Patients Only)       Balance Overall balance assessment: Needs assistance, History of Falls Sitting-balance support: No upper extremity supported Sitting balance-Leahy Scale: Good     Standing balance support: Bilateral upper extremity supported Standing balance-Leahy Scale: Poor Standing balance comment: requiring RW                            Cognition Arousal/Alertness: Awake/alert Behavior During Therapy: WFL for tasks assessed/performed Overall Cognitive Status: No family/caregiver present to determine baseline cognitive functioning                                 General Comments: Decreased insight/awareness; most likely at baseline.  Exercises      General Comments        Pertinent Vitals/Pain Pain Assessment Pain Assessment: No/denies pain Pain Intervention(s): Monitored during session    Home Living                          Prior Function            PT Goals (current goals can now be found in the care plan section) Acute Rehab PT Goals Patient Stated Goal: short term rehab then home PT Goal Formulation: With  patient/family Time For Goal Achievement: 07/09/22 Potential to Achieve Goals: Good Progress towards PT goals: Progressing toward goals    Frequency    Min 2X/week      PT Plan Current plan remains appropriate    Co-evaluation              AM-PAC PT "6 Clicks" Mobility   Outcome Measure  Help needed turning from your back to your side while in a flat bed without using bedrails?: A Little Help needed moving from lying on your back to sitting on the side of a flat bed without using bedrails?: A Little Help needed moving to and from a bed to a chair (including a wheelchair)?: A Little Help needed standing up from a chair using your arms (e.g., wheelchair or bedside chair)?: A Little Help needed to walk in hospital room?: A Lot Help needed climbing 3-5 steps with a railing? : A Lot 6 Click Score: 16    End of Session Equipment Utilized During Treatment: Gait belt Activity Tolerance: Patient tolerated treatment well Patient left: with chair alarm set;in chair;with call bell/phone within reach Nurse Communication: Mobility status PT Visit Diagnosis: Other abnormalities of gait and mobility (R26.89);Muscle weakness (generalized) (M62.81)     Time: 4967-5916 PT Time Calculation (min) (ACUTE ONLY): 37 min  Charges:  $Gait Training: 23-37 mins                     Verner Mould, [place] Office [phone]  06/29/22 4:43 PM

## 2022-06-29 NOTE — Progress Notes (Signed)
PROGRESS NOTE DE JAWORSKI  ELF:[id number] DOB: [birth year] DOA: 06/24/2022 PCP: Donnajean Lopes, MD   Brief Narrative/Hospital Course: 88 year old with history of DM2, glaucoma, HTN, HLD admitted to the hospital after having multiple falls at home. He was found to have rhabdomyolysis with CPK greater than 2000, started on IV fluids. Slowly has rhabdomyolysis resolved but did suffer from bilateral lower extremity swelling secondary to IV fluids. Echocardiogram showed preserved EF with grade 1 DD. PT/OT recommended SNF, awaiting placement      Subjective: Seen and examined this morning resting comfortably on the bedside recliner Overnight afebrile BP stable on room air Labs reviewed normal CBC.  Stable BMP Has somewhat edematous leg    Assessment and Plan: Principal Problem:   Rhabdomyolysis Active Problems:   Unspecified glaucoma   DM2 (diabetes mellitus, type 2) ([place])   Essential hypertension   Falls   HLD (hyperlipidemia) Rhabdomyolysis: Resolved with IV fluids.   Falls:No acute signs of trauma.  PT/OT recommending SNF, TOC working on placement   Demand ischemia with elevated troponin echo with preserved EF G1 DD.     Bilateral lower extremity edema:IVF was discontinued echo stable as above.  BNP was elevated.  Elevate leg supportive care.     DM2 insulin-dependent, hyperglycemia: Last A1c 8.0, plan is to resume home meds upon discharge.Appears to be on metformin, Jardiance, Lantus 30 units daily, glipizide and Ozempic.  He confirms that he takes all of these medications. Here on sliding scale and Accu-Cheks, Premeal NovoLog, increase Lantus to 16 units daily Recent Labs  Lab 06/24/22 1647 06/24/22 1705 06/28/22 1641 06/28/22 2120 06/28/22 2153 06/29/22 0746 06/29/22 1217  GLUCAP  --    < > 225* 84 128* 197* 213*  HGBA1C 8.0*  --   --   --   --   --   --    < > = values in this interval not displayed.     HTN: Home meds soft, held on admission Glaucoma:Resume  home med  DVT prophylaxis: enoxaparin (LOVENOX) injection 40 mg Start: 06/24/22 1800 Code Status:   Code Status: Full Code Family Communication: plan of care discussed with patient at bedside. Patient status is: Inpatient because of awaiting placement Level of care: Med-Surg   Dispo: The patient is from: home            Anticipated disposition: SNF, once bed available  Objective: Vitals last 24 hrs: Vitals:   06/27/22 2015 06/28/22 0447 06/28/22 2045 06/29/22 0450  BP: 118/65 130/75 133/66 124/67  Pulse: 70 73 77 65  Resp: '16 18 18 18  '$ Temp: 97.9 F (36.6 C) 97.9 F (36.6 C) 97.7 F (36.5 C) 98.3 F (36.8 C)  TempSrc: Oral Oral Oral Oral  SpO2: 94% 97% 98% 96%  Weight:      Height:       Weight change:   Physical Examination: General exam: alert awake, older than stated age HEENT:Oral mucosa moist, Ear/Nose WNL grossly Respiratory system: bilaterally CLEAR BS, no use of accessory muscle Cardiovascular system: S1 & S2 +, No JVD. Gastrointestinal system: Abdomen soft,NT,ND, BS+ Nervous System:Alert, awake, moving extremities. Extremities: LE edema +,distal peripheral pulses palpable.  Skin: No rashes,no icterus. MSK: Normal muscle bulk,tone, power  Medications reviewed:  Scheduled Meds:  aspirin  81 mg Oral QHS   brimonidine  1 drop Both Eyes Once   clopidogrel  75 mg Oral Daily   enoxaparin (LOVENOX) injection  40 mg Subcutaneous Q24H   insulin aspart  0-5 Units  Subcutaneous QHS   insulin aspart  0-9 Units Subcutaneous TID WC   insulin aspart  3 Units Subcutaneous TID WC   insulin glargine-yfgn  16 Units Subcutaneous Daily   timolol  1 drop Both Eyes Daily   Continuous Infusions:    Diet Order             Diet Carb Modified Fluid consistency: Thin; Room service appropriate? Yes  Diet effective now                            Intake/Output Summary (Last 24 hours) at 06/29/2022 1339 Last data filed at 06/29/2022 0737 Gross per 24 hour  Intake  240 ml  Output 1000 ml  Net -760 ml   Net IO Since Admission: -1,709 mL [06/29/22 1339]  Wt Readings from Last 3 Encounters:  06/24/22 90.7 kg  04/16/19 94.5 kg  09/13/18 103 kg     Unresulted Labs (From admission, onward)     Start     Ordered   06/27/22 7425  Basic metabolic panel  Daily,   R      06/26/22 0822   06/27/22 0500  Magnesium  Daily,   R      06/26/22 0822   06/26/22 0823  CBC  Every third day,   R      06/26/22 0822   06/25/22 0500  CK  Daily,   R      06/24/22 1557          Data Reviewed: I have personally reviewed following labs and imaging studies CBC: Recent Labs  Lab 06/24/22 0534 06/25/22 0704 06/26/22 0553 06/26/22 1017 06/29/22 0759  WBC 11.7* 7.4 6.2 7.2 6.5  NEUTROABS 9.2*  --   --   --   --   HGB 17.0 15.0 15.0 15.8 14.2  HCT 52.1* 47.3 46.2 48.1 42.9  MCV 91.1 92.7 93.0 91.4 91.1  PLT 224 159 160 199 956   Basic Metabolic Panel: Recent Labs  Lab 06/25/22 0704 06/26/22 0553 06/27/22 0653 06/28/22 0545 06/29/22 0534  NA 142 140 141 139 139  K 3.7 4.0 3.9 3.7 4.1  CL 109 111 109 107 107  CO2 '24 23 24 25 25  '$ GLUCOSE 107* 160* 162* 178* 142*  BUN 26* '19 21 20 21  '$ CREATININE 0.78 0.63 0.72 0.60* 0.64  CALCIUM 8.5* 8.3* 8.5* 8.3* 8.5*  MG  --  2.0 2.0 1.7 1.9  PHOS  --  2.4*  --   --   --    GFR: Estimated Creatinine Clearance: 73.8 mL/min (by C-G formula based on SCr of 0.64 mg/dL). Liver Function Tests: Recent Labs  Lab 06/24/22 0534 06/25/22 0704  AST 132* 73*  ALT 40 33  ALKPHOS 120 90  BILITOT 1.6* 0.9  PROT 6.7 5.7*  ALBUMIN 3.7 3.1*  Radiology Studies: No results found.   LOS: 5 days   Antonieta Pert, MD [facility]  06/29/2022, 1:39 PM

## 2022-06-30 LAB — BASIC METABOLIC PANEL
Anion gap: 6 (ref 5–15)
BUN: 21 mg/dL (ref 8–23)
CO2: 28 mmol/L (ref 22–32)
Calcium: 8.8 mg/dL — ABNORMAL LOW (ref 8.9–10.3)
Chloride: 106 mmol/L (ref 98–111)
Creatinine, Ser: 0.54 mg/dL — ABNORMAL LOW (ref 0.61–1.24)
GFR, Estimated: >60 mL/min (ref >60)
Glucose, Bld: 155 mg/dL — ABNORMAL HIGH (ref 70–99)
Potassium: 3.9 mmol/L (ref 3.5–5.1)
Sodium: 140 mmol/L (ref 135–145)

## 2022-06-30 LAB — CK: Total CK: 45 U/L — ABNORMAL LOW (ref 49–397)

## 2022-06-30 LAB — GLUCOSE, CAPILLARY
Glucose-Capillary: 132 mg/dL — ABNORMAL HIGH (ref 70–99)
Glucose-Capillary: 136 mg/dL — ABNORMAL HIGH (ref 70–99)
Glucose-Capillary: 188 mg/dL — ABNORMAL HIGH (ref 70–99)
Glucose-Capillary: 188 mg/dL — ABNORMAL HIGH (ref 70–99)
Glucose-Capillary: 208 mg/dL — ABNORMAL HIGH (ref 70–99)
Glucose-Capillary: 161 mg/dL — ABNORMAL HIGH (ref 70–99)

## 2022-06-30 LAB — MAGNESIUM: Magnesium: 1.9 mg/dL (ref 1.7–2.4)

## 2022-06-30 MED ORDER — ORAL CARE MOUTH RINSE: 15.0000 mL | OROMUCOSAL | Status: DC | PRN

## 2022-06-30 NOTE — Progress Notes (Signed)
Mobility Specialist - Progress Note   06/30/22 1004  Mobility  Activity Ambulated with assistance in hallway  Level of Assistance Contact guard assist, steadying assist  Assistive Device Front wheel walker  Distance Ambulated (ft) 350 ft  Activity Response Tolerated well  Mobility Referral Yes  $Mobility charge 1 Mobility   Pt received in chair and agreed to mobility, after a rest room break pt continued into the hallway where foot drop caused two instances of LOB, gait belt needed during ambulation. Pt returned to chair with all needs met and alarm on.   Roderick Pee Mobility Specialist

## 2022-06-30 NOTE — TOC Progression Note (Addendum)
Transition of Care [facility]) - Progression Note    Patient Details  Name: Derrick Wilson MRN: [id number] Date of Birth: [birth year]  Transition of Care [facility] CM/SW [place], RN Phone Number:[phone]  06/30/2022, 10:41 AM  Clinical Narrative:    CM spoke with [facility] admissions coordinator for [facility]. She is currently calling to determine status of insurance auth.  [place] not showing in navi portal after submission from [place]. CM has resubmitted Candace Cruise auth # [id number] now pending. Will continue to follow.         Expected Discharge Plan and Services                                                 Social Determinants of Health (SDOH) Interventions    Readmission Risk Interventions     No data to display

## 2022-06-30 NOTE — Progress Notes (Signed)
PROGRESS NOTE Derrick Wilson  FKC:[id number] DOB: 1934-06-27 DOA: 06/24/2022 PCP: Donnajean Lopes, MD   Brief Narrative/Hospital Course: 88 year old with history of DM2, glaucoma, HTN, HLD admitted to the hospital after having multiple falls at home. He was found to have rhabdomyolysis with CPK greater than 2000, started on IV fluids. Slowly has rhabdomyolysis resolved but did suffer from bilateral lower extremity swelling secondary to IV fluids. Echocardiogram showed preserved EF with grade 1 DD. PT/OT recommended SNF, awaiting placement    Subjective: Seen and examined resting comfortably in the bedside chair.  No new complaints. Has compression stocking on the leg,swelling better   Assessment and Plan: Principal Problem:   Rhabdomyolysis Active Problems:   Unspecified glaucoma   DM2 (diabetes mellitus, type 2) ([place])   Essential hypertension   Falls   HLD (hyperlipidemia)  Rhabdomyolysis: Resolved with IV fluids.  Falls:No acute signs of trauma.  PT/OT recommending SNF, TOC working on placement   Demand ischemia with elevated troponin echo with preserved EF G1 DD Bilateral lower extremity edema:IVF was discontinued echo stable as above.  BNP was elevated.  Elevate leg supportive care.  Continue compression stocking   DM2 insulin-dependent, hyperglycemia: Last A1c 8.0, plan is to resume home meds upon discharge.Appears to be on metformin, Jardiance, Lantus 30 units daily, glipizide and Ozempic.  He confirms that he takes all of these medications. Here on sliding scale and Accu-Cheks, Premeal NovoLog, increase Lantus to 16 units daily Recent Labs  Lab 06/24/22 1647 06/24/22 1705 06/29/22 1217 06/29/22 1621 06/29/22 2103 06/30/22 0321 06/30/22 0749  GLUCAP  --    < > 213* 174* 124* 132* 136*  HGBA1C 8.0*  --   --   --   --   --   --    < > = values in this interval not displayed.      HTN: Home meds soft, held on admission Glaucoma:Resume home med  DVT prophylaxis:  enoxaparin (LOVENOX) injection 40 mg Start: 06/24/22 1800 Code Status:   Code Status: Full Code Family Communication: plan of care discussed with patient at bedside. Patient status is: Inpatient because of awaiting placement Level of care: Med-Surg   Dispo: The patient is from: home            Anticipated disposition: SNF, once bed available  Objective: Vitals last 24 hrs: Vitals:   06/29/22 0450 06/29/22 1446 06/29/22 2102 06/30/22 0318  BP: 124/67 124/62 (!) 113/55 137/69  Pulse: 65 71 68 67  Resp: '18 16 18 18  '$ Temp: 98.3 F (36.8 C) 98.8 F (37.1 C) 98 F (36.7 C) 97.9 F (36.6 C)  TempSrc: Oral Oral Oral Oral  SpO2: 96% 93% 97% 97%  Weight:      Height:       Weight change:   Physical Examination: General exam: alert awake,older than stated age HEENT:Oral mucosa moist, Ear/Nose WNL grossly Respiratory system: Bilaterally clear BS, no use of accessory muscle Cardiovascular system: S1 & S2 +, No JVD. Gastrointestinal system: Abdomen soft,NT,ND, BS+ Nervous System: Alert, awake, moving extremities, he follows commands. Extremities: LE edema +,distal peripheral pulses palpable.  Skin: No rashes,no icterus. MSK: Normal muscle bulk,tone, power   Medications reviewed:  Scheduled Meds:  aspirin  81 mg Oral QHS   brimonidine  1 drop Both Eyes Once   clopidogrel  75 mg Oral Daily   enoxaparin (LOVENOX) injection  40 mg Subcutaneous Q24H   insulin aspart  0-5 Units Subcutaneous QHS   insulin aspart  0-9  Units Subcutaneous TID WC   insulin aspart  3 Units Subcutaneous TID WC   insulin glargine-yfgn  16 Units Subcutaneous Daily   timolol  1 drop Both Eyes Daily   Continuous Infusions:    Diet Order             Diet Carb Modified Fluid consistency: Thin; Room service appropriate? Yes  Diet effective now                  Intake/Output Summary (Last 24 hours) at 06/30/2022 1225 Last data filed at 06/30/2022 0644 Gross per 24 hour  Intake 480 ml  Output 600 ml   Net -120 ml    Net IO Since Admission: -1,829 mL [06/30/22 1225]  Wt Readings from Last 3 Encounters:  06/24/22 90.7 kg  04/16/19 94.5 kg  09/13/18 103 kg     Unresulted Labs (From admission, onward)     Start     Ordered   06/30/22 1153  Glucose, capillary  Once,   R        06/30/22 1153   06/30/22 1153  Glucose, capillary  Once,   R        06/30/22 1153   06/27/22 6213  Basic metabolic panel  Daily,   R      06/26/22 0822   06/27/22 0500  Magnesium  Daily,   R      06/26/22 0822   06/26/22 0823  CBC  Every third day,   R      06/26/22 0822   06/25/22 0500  CK  Daily,   R      06/24/22 1557          Data Reviewed: I have personally reviewed following labs and imaging studies CBC: Recent Labs  Lab 06/24/22 0534 06/25/22 0704 06/26/22 0553 06/26/22 1017 06/29/22 0759  WBC 11.7* 7.4 6.2 7.2 6.5  NEUTROABS 9.2*  --   --   --   --   HGB 17.0 15.0 15.0 15.8 14.2  HCT 52.1* 47.3 46.2 48.1 42.9  MCV 91.1 92.7 93.0 91.4 91.1  PLT 224 159 160 199 086    Basic Metabolic Panel: Recent Labs  Lab 06/26/22 0553 06/27/22 0653 06/28/22 0545 06/29/22 0534 06/30/22 0621  NA 140 141 139 139 140  K 4.0 3.9 3.7 4.1 3.9  CL 111 109 107 107 106  CO2 '23 24 25 25 28  '$ GLUCOSE 160* 162* 178* 142* 155*  BUN '19 21 20 21 21  '$ CREATININE 0.63 0.72 0.60* 0.64 0.54*  CALCIUM 8.3* 8.5* 8.3* 8.5* 8.8*  MG 2.0 2.0 1.7 1.9 1.9  PHOS 2.4*  --   --   --   --     GFR: Estimated Creatinine Clearance: 73.8 mL/min (A) (by C-G formula based on SCr of 0.54 mg/dL (L)). Liver Function Tests: Recent Labs  Lab 06/24/22 0534 06/25/22 0704  AST 132* 73*  ALT 40 33  ALKPHOS 120 90  BILITOT 1.6* 0.9  PROT 6.7 5.7*  ALBUMIN 3.7 3.1*   Radiology Studies: No results found.   LOS: 6 days   Antonieta Pert, MD [facility]  06/30/2022, 12:25 PM

## 2022-06-30 NOTE — Care Management Important Message (Signed)
Important Message  Patient Details IM Letter given Name: Derrick Wilson MRN: [id number] Date of Birth: 1934-02-12   Medicare Important Message Given:  Yes     Kerin Salen 06/30/2022, 9:46 AM

## 2022-06-30 NOTE — Discharge Summary (Incomplete)
Physician Discharge Summary  Derrick Wilson GEX:[id number] DOB: [birth year] DOA: 06/24/2022  PCP: Donnajean Lopes, MD  Admit date: 06/24/2022 Discharge date: 06/30/2022 Recommendations for Outpatient Follow-up:  Follow up with PCP in 1 weeks-call for appointment Please obtain BMP/CBC in one week  Discharge Dispo: SNF Discharge Condition: Stable Code Status:   Code Status: Full Code Diet recommendation:  Diet Order             Diet Carb Modified Fluid consistency: Thin; Room service appropriate? Yes  Diet effective now                   Brief/Interim Summary: 88 year old with history of DM2, glaucoma, HTN, HLD admitted to the hospital after having multiple falls at home. He was found to have rhabdomyolysis with CPK greater than 2000, started on IV fluids. Slowly has rhabdomyolysis resolved but did suffer from bilateral lower extremity swelling secondary to IV fluids. Echocardiogram showed preserved EF with grade 1 DD. PT/OT recommended SNF, awaiting placement    Discharge Diagnoses:  Principal Problem:   Rhabdomyolysis Active Problems:   Unspecified glaucoma   DM2 (diabetes mellitus, type 2) ([place])   Essential hypertension   Falls   HLD (hyperlipidemia)   Falls:No acute signs of trauma.  PT/OT recommending SNF, TOC working on placement   Demand ischemia with elevated troponin echo with preserved EF G1 DD.     Bilateral lower extremity edema:IVF was discontinued echo stable as above.  BNP was elevated.  Elevate leg supportive care.     DM2 insulin-dependent, hyperglycemia: Last A1c 8.0, plan is to resume home meds upon discharge.Appears to be on metformin, Jardiance, Lantus 30 units daily, glipizide and Ozempic.  He confirms that he takes all of these medications. Here on sliding scale and Accu-Cheks, Premeal NovoLog, increase Lantus to 16 units daily Recent Labs  Lab 06/24/22 1647 06/24/22 1705 06/29/22 1217 06/29/22 1621 06/29/22 2103 06/30/22 0321 06/30/22 0749   GLUCAP  --    < > 213* 174* 124* 132* 136*  HGBA1C 8.0*  --   --   --   --   --   --    < > = values in this interval not displayed.  HTN: Home meds soft, held on admission Glaucoma:Resume home med  Consults: None Subjective: ***  Discharge Exam: Vitals:   06/29/22 2102 06/30/22 0318  BP: (!) 113/55 137/69  Pulse: 68 67  Resp: 18 18  Temp: 98 F (36.7 C) 97.9 F (36.6 C)  SpO2: 97% 97%   General: Pt is alert, awake, not in acute distress Cardiovascular: RRR, S1/S2 +, no rubs, no gallops Respiratory: CTA bilaterally, no wheezing, no rhonchi Abdominal: Soft, NT, ND, bowel sounds + Extremities: no edema, no cyanosis  Discharge Instructions   Allergies as of 06/30/2022   No Known Allergies      Medication List     STOP taking these medications    losartan 100 MG tablet Commonly known as: COZAAR   Ozempic (0.25 or 0.5 MG/DOSE) 2 MG/1.5ML Sopn Generic drug: Semaglutide(0.25 or 0.'5MG'$ /DOS)       TAKE these medications    Alphagan P 0.15 % ophthalmic solution Generic drug: brimonidine Place 1 drop into both eyes at bedtime.   ASPIRIN 81 PO Take 81 mg by mouth at bedtime.   atorvastatin 20 MG tablet Commonly known as: LIPITOR Take 20 mg by mouth every evening.   clopidogrel 75 MG tablet Commonly known as: PLAVIX Take 1 tablet (75 mg total) by  mouth daily.   glipiZIDE 5 MG 24 hr tablet Commonly known as: GLUCOTROL XL Take 5 mg by mouth daily.   insulin glargine 100 UNIT/ML injection Commonly known as: Lantus Inject 0.16 mLs (16 Units total) into the skin daily. What changed: how much to take   Invokana 100 MG Tabs tablet Generic drug: canagliflozin Take 100 mg by mouth every evening.   Jardiance 25 MG Tabs tablet Generic drug: empagliflozin Take 25 mg by mouth daily.   metFORMIN 1000 MG tablet Commonly known as: GLUCOPHAGE Take 1,000 mg by mouth daily.   timolol 0.25 % ophthalmic solution Commonly known as: TIMOPTIC Place 1 drop into  both eyes daily.        Follow-up Information     Donnajean Lopes, MD Follow up in 1 week(s).   Specialty: Internal Medicine Contact information: [address] [place] [address] [phone]                No Known Allergies  The results of significant diagnostics from this hospitalization (including imaging, microbiology, ancillary and laboratory) are listed below for reference.    Microbiology: No results found for this or any previous visit (from the past 240 hour(s)).  Procedures/Studies: ECHOCARDIOGRAM COMPLETE  Result Date: 06/26/2022    ECHOCARDIOGRAM REPORT   Patient Name:   Derrick Wilson Date of Exam: 06/26/2022 Medical Rec #:  [id number]         Height:       69.0 in Accession #:    [id number]        Weight:       200.0 lb Date of Birth:  11-28-1933         BSA:          2.066 m Patient Age:    88 years          BP:           129/58 mmHg Patient Gender: M                 HR:           78 bpm. Exam Location:  Inpatient Procedure: 2D Echo, Color Doppler, Cardiac Doppler and Intracardiac            Opacification Agent Indications:    Elevated troponin  History:        Patient has prior history of Echocardiogram examinations, most                 recent 07/30/2018. Risk Factors:Diabetes, Hypertension and                 Dyslipidemia.  Sonographer:    Eartha Inch Referring Phys: [id number] [place]  Sonographer Comments: Technically difficult study due to poor echo windows and no subcostal window. Image acquisition challenging due to patient body habitus and Image acquisition challenging due to respiratory motion. IMPRESSIONS  1. Left ventricular ejection fraction, by estimation, is 60 to 65%. The left ventricle has normal function. The left ventricle has no regional wall motion abnormalities. There is mild concentric left ventricular hypertrophy. Left ventricular diastolic parameters are consistent with Grade I diastolic dysfunction (impaired relaxation).  2.  Right ventricular systolic function is normal. The right ventricular size is normal. Tricuspid regurgitation signal is inadequate for assessing PA pressure.  3. The mitral valve is grossly normal. No evidence of mitral valve regurgitation.  4. The aortic valve is tricuspid. There is mild calcification of the aortic valve. There is mild  thickening of the aortic valve. Aortic valve regurgitation is not visualized. Aortic valve sclerosis/calcification is present, without any evidence of aortic stenosis.  5. Aortic dilatation noted. There is borderline dilatation of the ascending aorta, measuring 37 mm.  6. The inferior vena cava is normal in size with greater than 50% respiratory variability, suggesting right atrial pressure of 3 mmHg. Comparison(s): No significant change from prior study. FINDINGS  Left Ventricle: Left ventricular ejection fraction, by estimation, is 60 to 65%. The left ventricle has normal function. The left ventricle has no regional wall motion abnormalities. Definity contrast agent was given IV to delineate the left ventricular  endocardial borders. The left ventricular internal cavity size was normal in size. There is mild concentric left ventricular hypertrophy. Left ventricular diastolic parameters are consistent with Grade I diastolic dysfunction (impaired relaxation). Right Ventricle: The right ventricular size is normal. No increase in right ventricular wall thickness. Right ventricular systolic function is normal. Tricuspid regurgitation signal is inadequate for assessing PA pressure. Left Atrium: Left atrial size was normal in size. Right Atrium: Right atrial size was normal in size. Pericardium: There is no evidence of pericardial effusion. Mitral Valve: The mitral valve is grossly normal. There is mild thickening of the mitral valve leaflet(s). There is mild calcification of the mitral valve leaflet(s). No evidence of mitral valve regurgitation. Tricuspid Valve: The tricuspid valve is  normal in structure. Tricuspid valve regurgitation is trivial. Aortic Valve: The aortic valve is tricuspid. There is mild calcification of the aortic valve. There is mild thickening of the aortic valve. Aortic valve regurgitation is not visualized. Aortic valve sclerosis/calcification is present, without any evidence of aortic stenosis. Pulmonic Valve: The pulmonic valve was normal in structure. Pulmonic valve regurgitation is trivial. Aorta: Aortic dilatation noted. There is borderline dilatation of the ascending aorta, measuring 37 mm. Venous: The inferior vena cava is normal in size with greater than 50% respiratory variability, suggesting right atrial pressure of 3 mmHg. IAS/Shunts: The atrial septum is grossly normal.  LEFT VENTRICLE PLAX 2D LVIDd:         3.90 cm     Diastology LVIDs:         2.40 cm     LV e' medial:    4.46 cm/s LV PW:         1.10 cm     LV E/e' medial:  15.3 LV IVS:        1.10 cm     LV e' lateral:   7.07 cm/s LVOT diam:     2.10 cm     LV E/e' lateral: 9.6 LV SV:         65 LV SV Index:   31 LVOT Area:     3.46 cm  LV Volumes (MOD) LV vol d, MOD A2C: 30.5 ml LV vol d, MOD A4C: 65.9 ml LV vol s, MOD A2C: 10.7 ml LV vol s, MOD A4C: 19.5 ml LV SV MOD A2C:     19.8 ml LV SV MOD A4C:     65.9 ml LV SV MOD BP:      30.1 ml RIGHT VENTRICLE RV S prime:     12.90 cm/s TAPSE (M-mode): 2.3 cm LEFT ATRIUM             Index        RIGHT ATRIUM           Index LA diam:        3.80 cm 1.84 cm/m   RA Area:  19.50 cm LA Vol (A2C):   41.5 ml 20.09 ml/m  RA Volume:   54.40 ml  26.33 ml/m LA Vol (A4C):   47.0 ml 22.75 ml/m LA Biplane Vol: 47.4 ml 22.94 ml/m  AORTIC VALVE LVOT Vmax:   89.40 cm/s LVOT Vmean:  70.100 cm/s LVOT VTI:    0.187 m  AORTA Ao Root diam: 3.30 cm Ao Asc diam:  3.25 cm MITRAL VALVE MV Area (PHT): 2.24 cm     SHUNTS MV Decel Time: 338 msec     Systemic VTI:  0.19 m MV E velocity: 68.10 cm/s   Systemic Diam: 2.10 cm MV A velocity: 102.00 cm/s MV E/A ratio:  0.67 Gwyndolyn Kaufman MD Electronically signed by Gwyndolyn Kaufman MD Signature Date/Time: 06/26/2022/12:59:48 PM    Final    CT Lumbar Spine Wo Contrast  Result Date: 06/24/2022 CLINICAL DATA:  Ataxia.  Multiple Falls EXAM: CT LUMBAR SPINE WITHOUT CONTRAST TECHNIQUE: Multidetector CT imaging of the lumbar spine was performed without intravenous contrast administration. Multiplanar CT image reconstructions were also generated. RADIATION DOSE REDUCTION: This exam was performed according to the departmental dose-optimization program which includes automated exposure control, adjustment of the mA and/or kV according to patient size and/or use of iterative reconstruction technique. COMPARISON:  None Available. FINDINGS: Segmentation: 5 lumbar type vertebrae. Alignment: There is trace retrolisthesis of L3 on L4. Grade 1 anterolisthesis of S1 on S2. Vertebrae: There is age indeterminate, but likely subacute to chronic appearing anterior superior endplate compression deformities at T11, T12, and L1. there are multilevel degenerative endplate changes, most significant at S1-S2 where there is sclerosis and cortical irregularity. There is no evidence of surrounding soft tissue stranding to definitively suggest the presence of discitis osteomyelitis. Paraspinal and other soft tissues: Status post cholecystectomy. Aortic and pelvic vasculature atherosclerotic calcifications. Disc levels: There are multilevel degenerative changes with disc space loss at nearly every lumbar vertebral body level. There is severe left-sided neural foraminal stenosis at L4-L5, L5-S1, and S1-S2. There is severe right-sided neural foraminal stenosis at L3-L4 L4-L5, L5-S1 and S1-S2. There are multilevel degenerative disc bulges without evidence of high-grade spinal canal stenosis. IMPRESSION: 1. Age-indeterminate, but likely subacute to chronic appearing anterior superior endplate compression deformities at T11, T12, and L1. Correlate with point tenderness.  2. Multilevel degenerative changes with disc space loss at nearly every lumbar vertebral body level. Severe left-sided neural foraminal stenosis at L4-L5, L5-S1, and S1-S2. Severe right-sided neural foraminal stenosis at L3-L4, L4-L5, L5-S1, and S1-S2. 3. No evidence of high-grade spinal canal stenosis. Aortic Atherosclerosis (ICD10-I70.0). Electronically Signed   By: Marin Roberts M.D.   On: 06/24/2022 12:22   CT Head Wo Contrast  Result Date: 06/24/2022 CLINICAL DATA:  Multiple falls.  Evaluate for bleed. EXAM: CT HEAD WITHOUT CONTRAST TECHNIQUE: Contiguous axial images were obtained from the base of the skull through the vertex without intravenous contrast. RADIATION DOSE REDUCTION: This exam was performed according to the departmental dose-optimization program which includes automated exposure control, adjustment of the mA and/or kV according to patient size and/or use of iterative reconstruction technique. COMPARISON:  Brain CT 07/29/2018 FINDINGS: Brain: Ventricles and sulci are prominent compatible with atrophy. Chronic microvascular ischemic changes. No evidence for acute cortically based infarct, intracranial hemorrhage, mass lesion or mass-effect. Vascular: No hyperdense vessel or unexpected calcification. Skull: Normal. Negative for fracture or focal lesion. Sinuses/Orbits: Paranasal sinuses are well aerated. Mastoid air cells are unremarkable. Other: None IMPRESSION: 1. No acute intracranial process. 2. Atrophy and chronic microvascular ischemic  changes. Electronically Signed   By: Lovey Newcomer M.D.   On: 06/24/2022 08:28   DG Pelvis 1-2 Views  Result Date: 06/24/2022 CLINICAL DATA:  Multiple falls.  Hip EXAM: PELVIS - 1-2 VIEW COMPARISON:  None Available. FINDINGS: Single view radiograph is available dictation. There is no evidence of pelvic fracture or diastasis. No pelvic bone lesions are seen. Severe disc space loss in the lower lumbar spine. Mild degenerative changes of the bilateral hip  joints. Trochanteric enthesopathic changes. Vascular calcifications. Pelvic phleboliths. IMPRESSION: No acute fracture or dislocation on this single view pelvis radiograph. Electronically Signed   By: Marin Roberts M.D.   On: 06/24/2022 07:56   DG Chest 2 View  Result Date: 06/24/2022 CLINICAL DATA:  Fall. EXAM: CHEST - 2 VIEW COMPARISON:  Chest radiograph 07/29/2018 FINDINGS: Stable cardiac and mediastinal contours. Low lung volumes. Patchy heterogeneous opacities within the lung bases bilaterally. No pleural effusion or pneumothorax. Thoracic spine degenerative changes. IMPRESSION: Low lung volumes with basilar atelectasis. Electronically Signed   By: Lovey Newcomer M.D.   On: 06/24/2022 07:54    Labs: BNP (last 3 results) Recent Labs    06/24/22 0534  BNP 211.9*   Basic Metabolic Panel: Recent Labs  Lab 06/26/22 0553 06/27/22 0653 06/28/22 0545 06/29/22 0534 06/30/22 0621  NA 140 141 139 139 140  K 4.0 3.9 3.7 4.1 3.9  CL 111 109 107 107 106  CO2 '23 24 25 25 28  '$ GLUCOSE 160* 162* 178* 142* 155*  BUN '19 21 20 21 21  '$ CREATININE 0.63 0.72 0.60* 0.64 0.54*  CALCIUM 8.3* 8.5* 8.3* 8.5* 8.8*  MG 2.0 2.0 1.7 1.9 1.9  PHOS 2.4*  --   --   --   --    Liver Function Tests: Recent Labs  Lab 06/24/22 0534 06/25/22 0704  AST 132* 73*  ALT 40 33  ALKPHOS 120 90  BILITOT 1.6* 0.9  PROT 6.7 5.7*  ALBUMIN 3.7 3.1*   No results for input(s): "LIPASE", "AMYLASE" in the last 168 hours. No results for input(s): "AMMONIA" in the last 168 hours. CBC: Recent Labs  Lab 06/24/22 0534 06/25/22 0704 06/26/22 0553 06/26/22 1017 06/29/22 0759  WBC 11.7* 7.4 6.2 7.2 6.5  NEUTROABS 9.2*  --   --   --   --   HGB 17.0 15.0 15.0 15.8 14.2  HCT 52.1* 47.3 46.2 48.1 42.9  MCV 91.1 92.7 93.0 91.4 91.1  PLT 224 159 160 199 165   Cardiac Enzymes: Recent Labs  Lab 06/26/22 0553 06/27/22 0653 06/28/22 0545 06/29/22 0534 06/30/22 0621  CKTOTAL 190 147 88 64 45*   BNP: Invalid input(s):  "POCBNP" CBG: Recent Labs  Lab 06/29/22 1217 06/29/22 1621 06/29/22 2103 06/30/22 0321 06/30/22 0749  GLUCAP 213* 174* 124* 132* 136*   D-Dimer No results for input(s): "DDIMER" in the last 72 hours. Hgb A1c No results for input(s): "HGBA1C" in the last 72 hours. Lipid Profile No results for input(s): "CHOL", "HDL", "LDLCALC", "TRIG", "CHOLHDL", "LDLDIRECT" in the last 72 hours. Thyroid function studies No results for input(s): "TSH", "T4TOTAL", "T3FREE", "THYROIDAB" in the last 72 hours.  Invalid input(s): "FREET3" Anemia work up No results for input(s): "VITAMINB12", "FOLATE", "FERRITIN", "TIBC", "IRON", "RETICCTPCT" in the last 72 hours. Urinalysis    Component Value Date/Time   COLORURINE YELLOW 06/24/2022 1943   APPEARANCEUR CLEAR 06/24/2022 1943   LABSPEC 1.030 06/24/2022 1943   PHURINE 5.0 06/24/2022 1943   GLUCOSEU >=500 (A) 06/24/2022 1943   HGBUR  NEGATIVE 06/24/2022 1943   BILIRUBINUR NEGATIVE 06/24/2022 [place] 06/24/2022 1943   PROTEINUR NEGATIVE 06/24/2022 1943   NITRITE NEGATIVE 06/24/2022 1943   LEUKOCYTESUR NEGATIVE 06/24/2022 1943   Sepsis Labs Recent Labs  Lab 06/25/22 0704 06/26/22 0553 06/26/22 1017 06/29/22 0759  WBC 7.4 6.2 7.2 6.5   Microbiology No results found for this or any previous visit (from the past 240 hour(s)).   Time coordinating discharge: 25 minutes  SIGNED: Antonieta Pert, MD  [facility] 06/30/2022, 12:24 PM  If 7PM-7AM, please contact night-coverage [email or web address]

## 2022-07-01 LAB — BASIC METABOLIC PANEL
Anion gap: 6 (ref 5–15)
BUN: 15 mg/dL (ref 8–23)
CO2: 28 mmol/L (ref 22–32)
Calcium: 9 mg/dL (ref 8.9–10.3)
Chloride: 104 mmol/L (ref 98–111)
Creatinine, Ser: 0.49 mg/dL — ABNORMAL LOW (ref 0.61–1.24)
GFR, Estimated: >60 mL/min (ref >60)
Glucose, Bld: 162 mg/dL — ABNORMAL HIGH (ref 70–99)
Potassium: 4.1 mmol/L (ref 3.5–5.1)
Sodium: 138 mmol/L (ref 135–145)

## 2022-07-01 LAB — GLUCOSE, CAPILLARY
Glucose-Capillary: 166 mg/dL — ABNORMAL HIGH (ref 70–99)
Glucose-Capillary: 236 mg/dL — ABNORMAL HIGH (ref 70–99)
Glucose-Capillary: 219 mg/dL — ABNORMAL HIGH (ref 70–99)
Glucose-Capillary: 191 mg/dL — ABNORMAL HIGH (ref 70–99)

## 2022-07-01 LAB — CK: Total CK: 58 U/L (ref 49–397)

## 2022-07-01 LAB — MAGNESIUM: Magnesium: 2 mg/dL (ref 1.7–2.4)

## 2022-07-01 MED ORDER — FUROSEMIDE 20 MG PO TABS
20.0000 mg | ORAL_TABLET | Freq: Once | ORAL | Status: AC
  Administered 2022-07-01: 20 mg via ORAL
  Filled 2022-07-01: qty 1

## 2022-07-01 NOTE — Progress Notes (Signed)
Mobility Specialist - Progress Note   07/01/22 1025  Mobility  Activity Transferred from chair to bed  Level of Assistance Contact guard assist, steadying assist  Assistive Device Front wheel walker  Distance Ambulated (ft) 2 ft  Activity Response Tolerated well  Mobility Referral Yes  $Mobility charge 1 Mobility   Pt declined mobility but requested to get back in bed, needed very little assistance for sit to stand transition. Pt in bed with all needs met and alarm on.   Chase Moore Mobility Specialist   

## 2022-07-01 NOTE — TOC Progression Note (Signed)
Transition of Care [facility]) - Progression Note    Patient Details  Name: Derrick Wilson MRN: [id number] Date of Birth: 1934/01/12  Transition of Care [facility]) CM/SW [place], RN Phone [phone]  07/01/2022, 12:30 PM  Clinical Narrative:    Insurance Josem Kaufmann remains pending        Expected Discharge Plan and Services                                                 Social Determinants of Health (SDOH) Interventions    Readmission Risk Interventions     No data to display

## 2022-07-01 NOTE — Progress Notes (Signed)
PROGRESS NOTE Derrick Wilson  EXB:[id number] DOB: Aug 06, 1933 DOA: 06/24/2022 PCP: Donnajean Lopes, MD   Brief Narrative/Hospital Course: 88 year old with history of DM2, glaucoma, HTN, HLD admitted to the hospital after having multiple falls at home. He was found to have rhabdomyolysis with CPK greater than 2000, started on IV fluids. Slowly has rhabdomyolysis resolved but did suffer from bilateral lower extremity swelling secondary to IV fluids. Echocardiogram showed preserved EF with grade 1 DD. PT/OT recommended SNF, awaiting placement    Subjective: Doing well no complaints.  Resting comfortably Assessment and Plan: Principal Problem:   Rhabdomyolysis Active Problems:   Unspecified glaucoma   DM2 (diabetes mellitus, type 2) ([place])   Essential hypertension   Falls   HLD (hyperlipidemia)  Rhabdomyolysis: Resolved with IV fluids.  Falls:No acute signs of trauma.  PT/OT recommending SNF, TOC working on placement   Demand ischemia with elevated troponin echo with preserved EF G1 DD Bilateral lower extremity edema:IVF was discontinued echo stable as above.  BNP was elevated.  Elevate leg supportive care.  Will add Lasix x 1 today p.o.  Continue compression stocking   DM2 insulin-dependent, hyperglycemia: Last A1c 8.0, plan is to resume home meds upon discharge.Appears to be on metformin, Jardiance, Lantus 30 units daily, glipizide and Ozempic.  He confirms that he takes all of these medications. Here on sliding scale and Accu-Cheks, Premeal NovoLog, increase Lantus to 16 units daily Recent Labs  Lab 06/24/22 1647 06/24/22 1705 06/30/22 1153 06/30/22 1609 06/30/22 2057 07/01/22 0804 07/01/22 1121  GLUCAP  --    < > 188*  188* 208* 161* 166* 236*  HGBA1C 8.0*  --   --   --   --   --   --    < > = values in this interval not displayed.      HTN: Home meds soft, held on admission Glaucoma:Resume home med  DVT prophylaxis: enoxaparin (LOVENOX) injection 40 mg Start: 06/24/22  1800 Code Status:   Code Status: Full Code Family Communication: plan of care discussed with patient at bedside. Patient status is: Inpatient because of awaiting placement Level of care: Med-Surg   Dispo: The patient is from: home            Anticipated disposition: SNF, once bed available.  He is medically stable for discharge  Objective: Vitals last 24 hrs: Vitals:   06/30/22 0318 06/30/22 1346 06/30/22 2051 07/01/22 0416  BP: 137/69 123/67 126/70 137/83  Pulse: 67 73 70 64  Resp: '18 16 18 18  '$ Temp: 97.9 F (36.6 C) 98.9 F (37.2 C) 97.7 F (36.5 C) 98.3 F (36.8 C)  TempSrc: Oral Oral Oral Oral  SpO2: 97% 93% 98% 98%  Weight:      Height:       Weight change:   Physical Examination: General exam: AA, weak,older appearing HEENT:Oral mucosa moist, Ear/Nose WNL grossly, dentition normal. Respiratory system: bilaterally diminished BS, no use of accessory muscle Cardiovascular system: S1 & S2 +, regular rate,. Gastrointestinal system: Abdomen soft, NT,ND,BS+ Nervous System:Alert, awake, moving extremities and grossly nonfocal Extremities: LE ankle edema +, lower extremities warm Skin: No rashes,no icterus. MSK: Normal muscle bulk,tone, power   Medications reviewed:  Scheduled Meds:  aspirin  81 mg Oral QHS   brimonidine  1 drop Both Eyes Once   clopidogrel  75 mg Oral Daily   enoxaparin (LOVENOX) injection  40 mg Subcutaneous Q24H   furosemide  20 mg Oral Once   insulin aspart  0-5 Units  Subcutaneous QHS   insulin aspart  0-9 Units Subcutaneous TID WC   insulin aspart  3 Units Subcutaneous TID WC   insulin glargine-yfgn  16 Units Subcutaneous Daily   timolol  1 drop Both Eyes Daily   Continuous Infusions:    Diet Order             Diet Carb Modified Fluid consistency: Thin; Room service appropriate? Yes  Diet effective now                  Intake/Output Summary (Last 24 hours) at 07/01/2022 1152 Last data filed at 07/01/2022 0808 Gross per 24 hour   Intake 240 ml  Output --  Net 240 ml    Net IO Since Admission: -1,589 mL [07/01/22 1152]  Wt Readings from Last 3 Encounters:  06/24/22 90.7 kg  04/16/19 94.5 kg  09/13/18 103 kg     Unresulted Labs (From admission, onward)     Start     Ordered   06/27/22 2440  Basic metabolic panel  Daily,   R      06/26/22 0822   06/27/22 0500  Magnesium  Daily,   R      06/26/22 0822   06/26/22 0823  CBC  Every third day,   R      06/26/22 0822   06/25/22 0500  CK  Daily,   R      06/24/22 1557          Data Reviewed: I have personally reviewed following labs and imaging studies CBC: Recent Labs  Lab 06/25/22 0704 06/26/22 0553 06/26/22 1017 06/29/22 0759  WBC 7.4 6.2 7.2 6.5  HGB 15.0 15.0 15.8 14.2  HCT 47.3 46.2 48.1 42.9  MCV 92.7 93.0 91.4 91.1  PLT 159 160 199 102    Basic Metabolic Panel: Recent Labs  Lab 06/26/22 0553 06/27/22 0653 06/28/22 0545 06/29/22 0534 06/30/22 0621 07/01/22 0745  NA 140 141 139 139 140 138  K 4.0 3.9 3.7 4.1 3.9 4.1  CL 111 109 107 107 106 104  CO2 '23 24 25 25 28 28  '$ GLUCOSE 160* 162* 178* 142* 155* 162*  BUN '19 21 20 21 21 15  '$ CREATININE 0.63 0.72 0.60* 0.64 0.54* 0.49*  CALCIUM 8.3* 8.5* 8.3* 8.5* 8.8* 9.0  MG 2.0 2.0 1.7 1.9 1.9 2.0  PHOS 2.4*  --   --   --   --   --     GFR: Estimated Creatinine Clearance: 73.8 mL/min (A) (by C-G formula based on SCr of 0.49 mg/dL (L)). Liver Function Tests: Recent Labs  Lab 06/25/22 0704  AST 73*  ALT 33  ALKPHOS 90  BILITOT 0.9  PROT 5.7*  ALBUMIN 3.1*   Radiology Studies: No results found.   LOS: 7 days   Antonieta Pert, MD [facility]  07/01/2022, 11:52 AM

## 2022-07-01 NOTE — Progress Notes (Signed)
Occupational Therapy Treatment Patient Details Name: Derrick Wilson MRN: [id number] DOB: 1933/12/09 Today's Date: 07/01/2022   History of present illness Pt is 88 yo male admitted 06/24/22 after 2 recent falls with rhabdomyolysis.  Pt has hx of DM, glaucoma, HTN, HLD, leg edema   OT comments  Patient was motivated to participate in the session. The session emphasized functional strengthening and functional transfer training, needed to facilitate progressive ADL performance. He required min guard assist for sit to stand. Upon initial sit to stand, he presented with moderate unsteadiness and a loss of balance, requiring steadying assist & a RW to correct. He has baseline L foot drop, causing unsteadiness in standing. He further required min guard to min assist for dynamic standing activities. Without further OT services, he is at risk for falls and restricted ADL participation.    Recommendations for follow up therapy are one component of a multi-disciplinary discharge planning process, led by the attending physician.  Recommendations may be updated based on patient status, additional functional criteria and insurance authorization.    Follow Up Recommendations  Skilled nursing-short term rehab (<3 hours/day)     Assistance Recommended at Discharge Frequent or constant Supervision/Assistance  Patient can return home with the following  A little help with walking and/or transfers;A little help with bathing/dressing/bathroom;Assistance with cooking/housework;Direct supervision/assist for medications management;Assist for transportation;Help with stairs or ramp for entrance;Direct supervision/assist for financial management   Equipment Recommendations  None recommended by OT       Precautions / Restrictions Precautions Precautions: Fall Restrictions Weight Bearing Restrictions: No Other Position/Activity Restrictions: chronic L foot drop       Mobility Bed Mobility      General bed  mobility comments: Pt was received seated in the bedside chair    Transfers Overall transfer level: Needs assistance Equipment used: Rolling walker (2 wheels) Transfers: Sit to/from Stand Sit to Stand: Min guard           General transfer comment: Pt presented with unsteadiness and moderate loss of balance upon initial stand, requiring steadying assist and RW to correct         ADL either performed or assessed with clinical judgement   ADL Overall ADL's : Needs assistance/impaired Eating/Feeding: Independent;Sitting   Grooming: Set up;Min guard Grooming Details (indicate cue type and reason): set-up assist seated or min guard standing at sink         Upper Body Dressing : Set up;Sitting   Lower Body Dressing: Minimal assistance;Sit to/from stand   Toilet Transfer: Minimal assistance;Ambulation                   Vision Baseline Vision/History: 1 Wears glasses            Cognition Arousal/Alertness: Awake/alert Behavior During Therapy: WFL for tasks assessed/performed Overall Cognitive Status: Within Functional Limits for tasks assessed        General Comments: pleasant, cooperative, able to follow 1-2 step commands consistently                   Pertinent Vitals/ Pain       Pain Assessment Pain Assessment: No/denies pain         Frequency  Min 2X/week        Progress Toward Goals  OT Goals(current goals can now be found in the care plan section)  Progress towards OT goals: Progressing toward goals  Acute Rehab OT Goals Patient Stated Goal: to get stronger OT Goal Formulation: With patient Time  For Goal Achievement: 07/10/22 Potential to Achieve Goals: Good  Plan Discharge plan remains appropriate       AM-PAC OT "6 Clicks" Daily Activity     Outcome Measure   Help from another person eating meals?: None Help from another person taking care of personal grooming?: A Little Help from another person toileting, which includes  using toliet, bedpan, or urinal?: A Little Help from another person bathing (including washing, rinsing, drying)?: A Little Help from another person to put on and taking off regular upper body clothing?: A Little Help from another person to put on and taking off regular lower body clothing?: A Little 6 Click Score: 19    End of Session Equipment Utilized During Treatment: Gait belt;Rolling walker (2 wheels)  OT Visit Diagnosis: Unsteadiness on feet (R26.81);Muscle weakness (generalized) (M62.81);History of falling (Z91.81)   Activity Tolerance Patient tolerated treatment well   Patient Left in chair;with call bell/phone within reach;with chair alarm set   Nurse Communication Mobility status        Time: 7741-4239 OT Time Calculation (min): 16 min  Charges: OT General Charges $OT Visit: 1 Visit OT Treatments $Therapeutic Activity: 8-22 mins    Leota Sauers, OTR/L 07/01/2022, 5:21 PM

## 2022-07-02 DIAGNOSIS — M6282 Rhabdomyolysis: Secondary | ICD-10-CM | POA: Diagnosis not present

## 2022-07-02 LAB — CBC
HCT: 47.1 % (ref 39.0–52.0)
Hemoglobin: 15.5 g/dL (ref 13.0–17.0)
MCH: 30.2 pg (ref 26.0–34.0)
MCHC: 32.9 g/dL (ref 30.0–36.0)
MCV: 91.6 fL (ref 80.0–100.0)
Platelets: 194 10*3/uL (ref 150–400)
RBC: 5.14 MIL/uL (ref 4.22–5.81)
RDW: 13.3 % (ref 11.5–15.5)
WBC: 6.6 10*3/uL (ref 4.0–10.5)
nRBC: 0 % (ref 0.0–0.2)

## 2022-07-02 LAB — BASIC METABOLIC PANEL WITH GFR
Anion gap: 7 (ref 5–15)
BUN: 24 mg/dL — ABNORMAL HIGH (ref 8–23)
CO2: 29 mmol/L (ref 22–32)
Calcium: 9 mg/dL (ref 8.9–10.3)
Chloride: 101 mmol/L (ref 98–111)
Creatinine, Ser: 0.77 mg/dL (ref 0.61–1.24)
GFR, Estimated: >60 mL/min
Glucose, Bld: 261 mg/dL — ABNORMAL HIGH (ref 70–99)
Potassium: 4 mmol/L (ref 3.5–5.1)
Sodium: 137 mmol/L (ref 135–145)

## 2022-07-02 LAB — GLUCOSE, CAPILLARY
Glucose-Capillary: 119 mg/dL — ABNORMAL HIGH (ref 70–99)
Glucose-Capillary: 121 mg/dL — ABNORMAL HIGH (ref 70–99)
Glucose-Capillary: 259 mg/dL — ABNORMAL HIGH (ref 70–99)
Glucose-Capillary: 226 mg/dL — ABNORMAL HIGH (ref 70–99)
Glucose-Capillary: 254 mg/dL — ABNORMAL HIGH (ref 70–99)
Glucose-Capillary: 104 mg/dL — ABNORMAL HIGH (ref 70–99)

## 2022-07-02 LAB — MAGNESIUM: Magnesium: 2 mg/dL (ref 1.7–2.4)

## 2022-07-02 LAB — CK: Total CK: 40 U/L — ABNORMAL LOW (ref 49–397)

## 2022-07-02 MED ORDER — FUROSEMIDE 10 MG/ML IJ SOLN
20.0000 mg | Freq: Once | INTRAMUSCULAR | Status: AC
  Administered 2022-07-02: 20 mg via INTRAVENOUS
  Filled 2022-07-02: qty 2

## 2022-07-02 NOTE — Progress Notes (Signed)
PROGRESS NOTE RUBLE PUMPHREY  KDT:[id number] DOB: July 02, 1934 DOA: 06/24/2022 PCP: Donnajean Lopes, MD   Brief Narrative/Hospital Course: 88 year old with history of DM2, glaucoma, HTN, HLD admitted to the hospital after having multiple falls at home. He was found to have rhabdomyolysis with CPK greater than 2000, started on IV fluids. Slowly has rhabdomyolysis resolved but did suffer from bilateral lower extremity swelling secondary to IV fluids. Echocardiogram showed preserved EF with grade 1 DD. PT/OT recommended SNF, awaiting placement    Subjective: Seen examined resting comfortably no complaints.  He voided well after oral Lasix he still has edematous leg.  Assessment and Plan: Principal Problem:   Rhabdomyolysis Active Problems:   Unspecified glaucoma   DM2 (diabetes mellitus, type 2) ([place])   Essential hypertension   Falls   HLD (hyperlipidemia)  Rhabdomyolysis: Resolved with IV fluids.  Falls:No acute signs of trauma.  PT/OT recommending SNF, TOC working on placement   Demand ischemia with elevated troponin echo with preserved EF G1 DD Bilateral lower extremity edema:IVF was discontinued echo stable as above.  BNP was elevated.  Elevate leg supportive care.  Will dose with IV Lasix x 1.Continue compression stocking   DM2 insulin-dependent, hyperglycemia: Last A1c 8.0, plan is to resume home meds upon discharge.Appears to be on metformin, Jardiance, Lantus 30 units daily, glipizide and Ozempic.  He confirms that he takes all of these medications.  Blood sugar currently fairly controlled with current Premeal NovoLog,  Lantus 16 units daily Recent Labs  Lab 07/01/22 1625 07/01/22 2124 07/02/22 0414 07/02/22 0738 07/02/22 1058  GLUCAP 219* 191* 119* 121* 259*   HTN: stable. Meds on hold Glaucoma:Resume home med  DVT prophylaxis: enoxaparin (LOVENOX) injection 40 mg Start: 06/24/22 1800 Code Status:   Code Status: Full Code Family Communication: plan of care discussed  with patient at bedside. Patient status is: Inpatient because of awaiting placement Level of care: Med-Surg   Dispo: The patient is from: home            Anticipated disposition: SNF, once bed available.  He is medically stable for discharge  Objective: Vitals last 24 hrs: Vitals:   07/01/22 0416 07/01/22 1346 07/01/22 2021 07/02/22 0418  BP: 137/83 121/65 121/63 129/63  Pulse: 64 73 69 (!) 56  Resp: '18 20 16 18  '$ Temp: 98.3 F (36.8 C) 98 F (36.7 C) 98.1 F (36.7 C) 98 F (36.7 C)  TempSrc: Oral Oral Oral Oral  SpO2: 98% 97% 96% 98%  Weight:      Height:       Weight change:   Physical Examination: General exam: AAox3, weak,older appearing HEENT:Oral mucosa moist, Ear/Nose WNL grossly, dentition normal. Respiratory system: bilaterally  diminished BS,no use of accessory muscle Cardiovascular system: S1 & S2 +, regular rate, JVD neg. Gastrointestinal system: Abdomen soft, NT,ND,BS+ Nervous System:Alert, awake, moving extremities and grossly nonfocal Extremities: LE ankle edema +, lower extremities warm Skin: No rashes,no icterus. MSK: Normal muscle bulk,tone, power   Medications reviewed:  Scheduled Meds:  aspirin  81 mg Oral QHS   brimonidine  1 drop Both Eyes Once   clopidogrel  75 mg Oral Daily   enoxaparin (LOVENOX) injection  40 mg Subcutaneous Q24H   insulin aspart  0-5 Units Subcutaneous QHS   insulin aspart  0-9 Units Subcutaneous TID WC   insulin aspart  3 Units Subcutaneous TID WC   insulin glargine-yfgn  16 Units Subcutaneous Daily   timolol  1 drop Both Eyes Daily   Continuous Infusions:  Diet Order             Diet Carb Modified Fluid consistency: Thin; Room service appropriate? Yes  Diet effective now                  Intake/Output Summary (Last 24 hours) at 07/02/2022 1205 Last data filed at 07/02/2022 1100 Gross per 24 hour  Intake 480 ml  Output 600 ml  Net -120 ml    Net IO Since Admission: -1,709 mL [07/02/22 1205]  Wt  Readings from Last 3 Encounters:  06/24/22 90.7 kg  04/16/19 94.5 kg  09/13/18 103 kg     Unresulted Labs (From admission, onward)     Start     Ordered   06/27/22 2035  Basic metabolic panel  Daily,   R      06/26/22 0822   06/27/22 0500  Magnesium  Daily,   R      06/26/22 0822   06/25/22 0500  CK  Daily,   R      06/24/22 1557          Data Reviewed: I have personally reviewed following labs and imaging studies CBC: Recent Labs  Lab 06/26/22 0553 06/26/22 1017 06/29/22 0759 07/02/22 1107  WBC 6.2 7.2 6.5 6.6  HGB 15.0 15.8 14.2 15.5  HCT 46.2 48.1 42.9 47.1  MCV 93.0 91.4 91.1 91.6  PLT 160 199 165 597    Basic Metabolic Panel: Recent Labs  Lab 06/26/22 0553 06/27/22 0653 06/28/22 0545 06/29/22 0534 06/30/22 0621 07/01/22 0745 07/02/22 1107  NA 140   < > 139 139 140 138 137  K 4.0   < > 3.7 4.1 3.9 4.1 4.0  CL 111   < > 107 107 106 104 101  CO2 23   < > '25 25 28 28 29  '$ GLUCOSE 160*   < > 178* 142* 155* 162* 261*  BUN 19   < > '20 21 21 15 '$ 24*  CREATININE 0.63   < > 0.60* 0.64 0.54* 0.49* 0.77  CALCIUM 8.3*   < > 8.3* 8.5* 8.8* 9.0 9.0  MG 2.0   < > 1.7 1.9 1.9 2.0 2.0  PHOS 2.4*  --   --   --   --   --   --    < > = values in this interval not displayed.    GFR: Estimated Creatinine Clearance: 73.8 mL/min (by C-G formula based on SCr of 0.77 mg/dL). Liver Function Tests: No results for input(s): "AST", "ALT", "ALKPHOS", "BILITOT", "PROT", "ALBUMIN" in the last 168 hours. Radiology Studies: No results found.   LOS: 8 days   Antonieta Pert, MD [facility]  07/02/2022, 12:05 PM

## 2022-07-02 NOTE — Progress Notes (Signed)
Physical Therapy Treatment Patient Details Name: Derrick Wilson MRN: [id number] DOB: 1933-08-26 Today's Date: 07/02/2022   History of Present Illness Pt is 88 yo male admitted 06/24/22 after 2 recent falls with rhabdomyolysis.  Pt has hx of DM, glaucoma, HTN, HLD, leg edema    PT Comments    General Comments: pleasant, cooperative, able to follow 2 - 3  step commands consistently Pt OOB in recliner.  Assisted with amb in hallway.  General transfer comment: increased time to rise and VC's on safety with turns.  General Gait Details: prior to admit, pt was amb with a Hurricane "most of the time" and had a walker "if needed".  Pt tolerated a good distance in hallway with walker but demenstrated increased gait instability when trial amb without.  Pt "high steps" L LE due to his Chronic foot drop and with increased distance/activity L LE weakens.  "That's what trips me up".  Pt also NOT performing at prior activity level.  "I usually move more than this". Pt also present with poor forward flexed posture.  Increasingly unsteady with turns and backward steps to recliner.  Delayed self correction reaction response.  HIGH FALL RISK. Pt will need ST Rehab at SNF to address mobility and functional decline prior to safely returning home.   Recommendations for follow up therapy are one component of a multi-disciplinary discharge planning process, led by the attending physician.  Recommendations may be updated based on patient status, additional functional criteria and insurance authorization.  Follow Up Recommendations  Skilled nursing-short term rehab (<3 hours/day)     Assistance Recommended at Discharge Intermittent Supervision/Assistance  Patient can return home with the following A little help with walking and/or transfers;A little help with bathing/dressing/bathroom;Assistance with cooking/housework;Help with stairs or ramp for entrance   Equipment Recommendations  Rolling walker (2 wheels)     Recommendations for Other Services       Precautions / Restrictions Precautions Precautions: Fall Precaution Comments: Chronic L foot drop (declines AFO) Restrictions Weight Bearing Restrictions: No     Mobility  Bed Mobility               General bed mobility comments: OOB in recliner    Transfers Overall transfer level: Needs assistance Equipment used: Rolling walker (2 wheels) Transfers: Sit to/from Stand Sit to Stand: Min guard           General transfer comment: increased time to rise and VC's on safety with turns    Ambulation/Gait Ambulation/Gait assistance: Min assist, Mod assist Gait Distance (Feet): 145 Feet Assistive device: Rolling walker (2 wheels), 1 person hand held assist Gait Pattern/deviations: Step-through pattern, Decreased stride length, Decreased dorsiflexion - right, Decreased dorsiflexion - left Gait velocity: decreased     General Gait Details: prior to admit, pt was amb with a Hurricane "most of the time" and had a walker "if needed".  Pt tolerated a good distance in hallway with walker but demenstrated increased gait instability when trial amb without.  Pt "high steps" L LE due to his Chronic foot drop and with increased distance/activity L LE weakens.  "That's what trips me up".  Pt also NOT performing at prior activity level.  "I usually move more than this".   Stairs             Wheelchair Mobility    Modified Rankin (Stroke Patients Only)       Balance  Cognition Arousal/Alertness: Awake/alert Behavior During Therapy: WFL for tasks assessed/performed Overall Cognitive Status: Within Functional Limits for tasks assessed                                 General Comments: pleasant, cooperative, able to follow 2 - 3  step commands consistently        Exercises      General Comments        Pertinent Vitals/Pain Pain Assessment Pain  Assessment: Faces Pain Location: R knee Pain Descriptors / Indicators: Discomfort, Aching Pain Intervention(s): Monitored during session    Home Living                          Prior Function            PT Goals (current goals can now be found in the care plan section) Progress towards PT goals: Progressing toward goals    Frequency           PT Plan Current plan remains appropriate    Co-evaluation              AM-PAC PT "6 Clicks" Mobility   Outcome Measure  Help needed turning from your back to your side while in a flat bed without using bedrails?: A Little Help needed moving from lying on your back to sitting on the side of a flat bed without using bedrails?: A Little Help needed moving to and from a bed to a chair (including a wheelchair)?: A Little Help needed standing up from a chair using your arms (e.g., wheelchair or bedside chair)?: A Little Help needed to walk in hospital room?: A Lot Help needed climbing 3-5 steps with a railing? : A Lot 6 Click Score: 16    End of Session Equipment Utilized During Treatment: Gait belt Activity Tolerance: Patient tolerated treatment well Patient left: with chair alarm set;in chair;with call bell/phone within reach Nurse Communication: Mobility status PT Visit Diagnosis: Other abnormalities of gait and mobility (R26.89);Muscle weakness (generalized) (M62.81)     Time: 6629-4765 PT Time Calculation (min) (ACUTE ONLY): 26 min  Charges:  $Gait Training: 8-22 mins $Therapeutic Activity: 8-22 mins                    Rica Koyanagi  PTA [place] Office M-F          [phone] Weekend pager [phone]

## 2022-07-03 DIAGNOSIS — M6282 Rhabdomyolysis: Secondary | ICD-10-CM | POA: Diagnosis not present

## 2022-07-03 LAB — GLUCOSE, CAPILLARY
Glucose-Capillary: 92 mg/dL (ref 70–99)
Glucose-Capillary: 127 mg/dL — ABNORMAL HIGH (ref 70–99)
Glucose-Capillary: 283 mg/dL — ABNORMAL HIGH (ref 70–99)
Glucose-Capillary: 244 mg/dL — ABNORMAL HIGH (ref 70–99)
Glucose-Capillary: 201 mg/dL — ABNORMAL HIGH (ref 70–99)

## 2022-07-03 LAB — MAGNESIUM: Magnesium: 2.2 mg/dL (ref 1.7–2.4)

## 2022-07-03 LAB — BASIC METABOLIC PANEL
Anion gap: 6 (ref 5–15)
BUN: 26 mg/dL — ABNORMAL HIGH (ref 8–23)
CO2: 32 mmol/L (ref 22–32)
Calcium: 8.8 mg/dL — ABNORMAL LOW (ref 8.9–10.3)
Chloride: 98 mmol/L (ref 98–111)
Creatinine, Ser: 0.73 mg/dL (ref 0.61–1.24)
GFR, Estimated: >60 mL/min (ref >60)
Glucose, Bld: 121 mg/dL — ABNORMAL HIGH (ref 70–99)
Potassium: 4.4 mmol/L (ref 3.5–5.1)
Sodium: 136 mmol/L (ref 135–145)

## 2022-07-03 LAB — CK: Total CK: 42 U/L — ABNORMAL LOW (ref 49–397)

## 2022-07-03 MED ORDER — FUROSEMIDE 20 MG PO TABS
20.0000 mg | ORAL_TABLET | Freq: Every day | ORAL | Status: DC
  Administered 2022-07-03 – 2022-07-07 (×5): 20 mg via ORAL
  Filled 2022-07-03 (×5): qty 1

## 2022-07-03 NOTE — TOC Progression Note (Signed)
Transition of Care [facility]) - Progression Note    Patient Details  Name: Derrick Wilson MRN: [id number] Date of Birth: [birth year]  [facility] CM/SW Contact  Lennart Pall, LCSW Phone Number: 07/03/2022, 2:19 PM  Clinical Narrative:    Insurance authorization still pending and noted a message in portal of MD peer-to-peer request.  Have alerted MD.        Expected Discharge Plan and Services                                                 Social Determinants of Health (SDOH) Interventions    Readmission Risk Interventions     No data to display

## 2022-07-03 NOTE — Progress Notes (Signed)
PROGRESS NOTE Derrick Wilson  XFG:[id number] DOB: 01/05/1934 DOA: 06/24/2022 PCP: Donnajean Lopes, MD   Brief Narrative/Hospital Course: 88 year old with history of DM2, glaucoma, HTN, HLD admitted to the hospital after having multiple falls at home. He was found to have rhabdomyolysis with CPK greater than 2000, started on IV fluids. Slowly has rhabdomyolysis resolved but did suffer from bilateral lower extremity swelling secondary to IV fluids. Echocardiogram showed preserved EF with grade 1 DD. PT/OT recommended SNF, awaiting placement    Subjective: Seen and examined. Resting comfortably on the bedside chair. Overnight patient has been afebrile.  Saturating well on room air.  Labs reviewed this morning stable BUN and creatinine and electrolytes.   Assessment and Plan: Principal Problem:   Rhabdomyolysis Active Problems:   Unspecified glaucoma   DM2 (diabetes mellitus, type 2) ([place])   Essential hypertension   Falls   HLD (hyperlipidemia)  Rhabdomyolysis: Resolved with IV fluids. Falls:No acute signs of trauma.  PT/OT recommending SNF, TOC working on placement Demand ischemia with elevated troponin echo with preserved EF G1 DD Bilateral lower extremity edema:IVF was discontinued echo stable as above.  BNP was elevated.Patient was given IV Lasix reassess and redose as needed> will keep on lasix '20mg'$  daily. Cont to elevate leg.  Continue compression stocking.   DM2 insulin-dependent, hyperglycemia: Last A1c 8.0, blood sugar currently well-controlled on NovoLog 3 units Premeal and Semglee 16 units daily, SSI at home on metformin, Jardiance, Lantus 30 units daily, glipizide and Ozempic. He confirms that he takes all of these medications> but on discharge will discontinue glipizide, cut down Levemir to current dose and continue rest of the medication Recent Labs  Lab 07/02/22 1219 07/02/22 1647 07/02/22 2130 07/03/22 0308 07/03/22 0757  GLUCAP 226* 254* 104* 92 127*   HTN:  stable. Meds on hold Glaucoma:Resume home med  DVT prophylaxis: enoxaparin (LOVENOX) injection 40 mg Start: 06/24/22 1800 Code Status:   Code Status: Full Code Family Communication: plan of care discussed with patient at bedside. Patient status is: Inpatient because of awaiting placement Level of care: Med-Surg   Dispo: The patient is from: home            Anticipated disposition: SNF, once bed available.  He is medically stable for discharge  Objective: Vitals last 24 hrs: Vitals:   07/02/22 0418 07/02/22 1328 07/02/22 1936 07/03/22 0310  BP: 129/63 118/65 122/61 118/61  Pulse: (!) 56 74 73 66  Resp: '18 20 18 16  '$ Temp: 98 F (36.7 C) 98.7 F (37.1 C) 98.6 F (37 C) 98.1 F (36.7 C)  TempSrc: Oral Oral Oral Oral  SpO2: 98% 95% 96% 98%  Weight:      Height:       Weight change:   Physical Examination: General exam: AAox3, weak,older appearing HEENT:Oral mucosa moist, Ear/Nose WNL grossly, dentition normal. Respiratory system: bilaterally clear BS, no use of accessory muscle Cardiovascular system: S1 & S2 +, regular rate, JVD neg. Gastrointestinal system: Abdomen soft,NT,ND,BS+ Nervous System:Alert, awake, moving extremities and grossly nonfocal Extremities: LE ankle edema +, lower extremities warm Skin: No rashes,no icterus. RCV:ELFYBO muscle bulk,tone, power    Medications reviewed:  Scheduled Meds:  aspirin  81 mg Oral QHS   brimonidine  1 drop Both Eyes Once   clopidogrel  75 mg Oral Daily   enoxaparin (LOVENOX) injection  40 mg Subcutaneous Q24H   insulin aspart  0-5 Units Subcutaneous QHS   insulin aspart  0-9 Units Subcutaneous TID WC   insulin aspart  3 Units Subcutaneous TID WC   insulin glargine-yfgn  16 Units Subcutaneous Daily   timolol  1 drop Both Eyes Daily   Continuous Infusions:    Diet Order             Diet Carb Modified Fluid consistency: Thin; Room service appropriate? Yes  Diet effective now                  Intake/Output Summary  (Last 24 hours) at 07/03/2022 0934 Last data filed at 07/03/2022 0700 Gross per 24 hour  Intake 480 ml  Output 750 ml  Net -270 ml    Net IO Since Admission: -1,879 mL [07/03/22 0934]  Wt Readings from Last 3 Encounters:  06/24/22 90.7 kg  04/16/19 94.5 kg  09/13/18 103 kg     Unresulted Labs (From admission, onward)     Start     Ordered   06/25/22 0500  CK  Daily,   R      06/24/22 1557          Data Reviewed: I have personally reviewed following labs and imaging studies CBC: Recent Labs  Lab 06/26/22 1017 06/29/22 0759 07/02/22 1107  WBC 7.2 6.5 6.6  HGB 15.8 14.2 15.5  HCT 48.1 42.9 47.1  MCV 91.4 91.1 91.6  PLT 199 165 741    Basic Metabolic Panel: Recent Labs  Lab 06/29/22 0534 06/30/22 0621 07/01/22 0745 07/02/22 1107 07/03/22 0729  NA 139 140 138 137 136  K 4.1 3.9 4.1 4.0 4.4  CL 107 106 104 101 98  CO2 '25 28 28 29 '$ 32  GLUCOSE 142* 155* 162* 261* 121*  BUN '21 21 15 '$ 24* 26*  CREATININE 0.64 0.54* 0.49* 0.77 0.73  CALCIUM 8.5* 8.8* 9.0 9.0 8.8*  MG 1.9 1.9 2.0 2.0 2.2    GFR: Estimated Creatinine Clearance: 73.8 mL/min (by C-G formula based on SCr of 0.73 mg/dL). Liver Function Tests: No results for input(s): "AST", "ALT", "ALKPHOS", "BILITOT", "PROT", "ALBUMIN" in the last 168 hours. Radiology Studies: No results found.   LOS: 9 days   Antonieta Pert, MD [facility]  07/03/2022, 9:34 AM

## 2022-07-04 DIAGNOSIS — M6282 Rhabdomyolysis: Secondary | ICD-10-CM | POA: Diagnosis not present

## 2022-07-04 LAB — GLUCOSE, CAPILLARY
Glucose-Capillary: 92 mg/dL (ref 70–99)
Glucose-Capillary: 121 mg/dL — ABNORMAL HIGH (ref 70–99)
Glucose-Capillary: 194 mg/dL — ABNORMAL HIGH (ref 70–99)
Glucose-Capillary: 219 mg/dL — ABNORMAL HIGH (ref 70–99)
Glucose-Capillary: 214 mg/dL — ABNORMAL HIGH (ref 70–99)

## 2022-07-04 NOTE — TOC Progression Note (Addendum)
Transition of Care [facility]) - Progression Note    Patient Details  Name: Derrick Wilson MRN: [id number] Date of Birth: [birth year]  Transition of Care [facility]) CM/SW [place], RN Phone Number:[phone]  07/04/2022, 10:38 AM  Clinical Narrative:    CM has received message from [facility] health stating that patient has been denied insurance auth due to not having enough change in function to warrant daily skilled therapy need. Appeal # [phone]. This message has been given to patient and patient states that he will appeal.   1151 Cm spoke with son Abe People and per son expedited appeal has been filed. [facility] has been made aware. TOC will continue to follow for determination.        Expected Discharge Plan and Services                                                 Social Determinants of Health (SDOH) Interventions    Readmission Risk Interventions     No data to display

## 2022-07-04 NOTE — Progress Notes (Signed)
PROGRESS NOTE Derrick Wilson  URK:[id number] DOB: 02-05-1934 DOA: 06/24/2022 PCP: Donnajean Lopes, MD   Brief Narrative/Hospital Course: 88 year old with history of DM2, glaucoma, HTN, HLD admitted to the hospital after having multiple falls at home. He was found to have rhabdomyolysis with CPK greater than 2000, started on IV fluids. Slowly has rhabdomyolysis resolved but did suffer from bilateral lower extremity swelling secondary to IV fluids. Echocardiogram showed preserved EF with grade 1 DD. PT/OT recommended SNF, awaiting placement    Subjective: Seen and examined this morning he is alert awake leg swelling overall improving.  No complaints,  He is appreciative of the care but frustrated that insurance has declined SNF   Assessment and Plan: Principal Problem:   Rhabdomyolysis Active Problems:   Unspecified glaucoma   DM2 (diabetes mellitus, type 2) ([place])   Essential hypertension   Falls   HLD (hyperlipidemia)  Rhabdomyolysis: Resolved with IV fluids. Falls:No acute signs of trauma.  PT/OT recommending SNF, TOC working on placement-filing for appeal s SNF has been denied by insurance Demand ischemia with elevated troponin echo with preserved EF G1 DD Bilateral lower extremity edema:IVF was discontinued echo stable as above.  BNP was elevated.Patient was given IV Lasix. Now on lasix '20mg'$  daily. Cont to elevate leg.  Continue compression stocking.   DM2 insulin-dependent, hyperglycemia: Last A1c 8.0, blood sugar currently well-controlled on NovoLog 3 units Premeal and Semglee 16 units daily, SSI at home on metformin, Jardiance, Lantus 30 units daily, glipizide and Ozempic. He confirms that he takes all of these medications> but on discharge will discontinue glipizide, cut down Levemir to current dose .  Monitor blood sugar as below  Recent Labs  Lab 07/03/22 1115 07/03/22 1605 07/03/22 2037 07/04/22 0305 07/04/22 0735  GLUCAP 283* 244* 201* 92 121*   HTN: stable. Meds on  hold.  Now on p.o. Lasix Glaucoma:Resume home med  DVT prophylaxis: enoxaparin (LOVENOX) injection 40 mg Start: 06/24/22 1800 Code Status:   Code Status: Full Code Family Communication: plan of care discussed with patient at bedside. Patient status is: Inpatient because of awaiting placement Level of care: Med-Surg   Dispo: The patient is from: home            Anticipated disposition: SNF, once bed available, appeal filed  Objective: Vitals last 24 hrs: Vitals:   07/03/22 0310 07/03/22 1321 07/03/22 2036 07/04/22 0307  BP: 118/61 116/61 110/60 115/64  Pulse: 66 76 75 79  Resp: '16 18 16 16  '$ Temp: 98.1 F (36.7 C) 98.8 F (37.1 C) 99 F (37.2 C) (!) 97.5 F (36.4 C)  TempSrc: Oral Oral Oral Oral  SpO2: 98% 97% 95% 96%  Weight:      Height:       Weight change:   Physical Examination: General exam: AAox3, weak,older appearing HEENT:Oral mucosa moist, Ear/Nose WNL grossly, dentition normal. Respiratory system: bilaterally clear BS, no use of accessory muscle Cardiovascular system: S1 & S2 +, regular rate,. Gastrointestinal system: Abdomen soft, +NT,ND,BS+ Nervous System:Alert, awake, moving extremities and grossly nonfocal Extremities: LE ankle edema +, lower extremities warm Skin: No rashes,no icterus. MSK: Normal muscle bulk,tone, power    Medications reviewed:  Scheduled Meds:  aspirin  81 mg Oral QHS   clopidogrel  75 mg Oral Daily   enoxaparin (LOVENOX) injection  40 mg Subcutaneous Q24H   furosemide  20 mg Oral Daily   insulin aspart  0-5 Units Subcutaneous QHS   insulin aspart  0-9 Units Subcutaneous TID WC  insulin aspart  3 Units Subcutaneous TID WC   insulin glargine-yfgn  16 Units Subcutaneous Daily   timolol  1 drop Both Eyes Daily   Continuous Infusions:    Diet Order             Diet Carb Modified Fluid consistency: Thin; Room service appropriate? Yes  Diet effective now                  Intake/Output Summary (Last 24 hours) at 07/04/2022  1200 Last data filed at 07/04/2022 6387 Gross per 24 hour  Intake --  Output 400 ml  Net -400 ml    Net IO Since Admission: -2,039 mL [07/04/22 1200]  Wt Readings from Last 3 Encounters:  06/24/22 90.7 kg  04/16/19 94.5 kg  09/13/18 103 kg     Unresulted Labs (From admission, onward)    None     Data Reviewed: I have personally reviewed following labs and imaging studies CBC: Recent Labs  Lab 06/29/22 0759 07/02/22 1107  WBC 6.5 6.6  HGB 14.2 15.5  HCT 42.9 47.1  MCV 91.1 91.6  PLT 165 564    Basic Metabolic Panel: Recent Labs  Lab 06/29/22 0534 06/30/22 0621 07/01/22 0745 07/02/22 1107 07/03/22 0729  NA 139 140 138 137 136  K 4.1 3.9 4.1 4.0 4.4  CL 107 106 104 101 98  CO2 '25 28 28 29 '$ 32  GLUCOSE 142* 155* 162* 261* 121*  BUN '21 21 15 '$ 24* 26*  CREATININE 0.64 0.54* 0.49* 0.77 0.73  CALCIUM 8.5* 8.8* 9.0 9.0 8.8*  MG 1.9 1.9 2.0 2.0 2.2    GFR: Estimated Creatinine Clearance: 73.8 mL/min (by C-G formula based on SCr of 0.73 mg/dL). Liver Function Tests: No results for input(s): "AST", "ALT", "ALKPHOS", "BILITOT", "PROT", "ALBUMIN" in the last 168 hours. Radiology Studies: No results found.   LOS: 10 days   Antonieta Pert, MD [facility]  07/04/2022, 12:00 PM

## 2022-07-04 NOTE — Progress Notes (Signed)
Physical Therapy Treatment Patient Details Name: Derrick Wilson MRN: [id number] DOB: Oct 27, 1933 Today's Date: 07/04/2022   History of Present Illness Pt is 88 yo male admitted 06/24/22 after 2 recent falls with rhabdomyolysis.  Pt has hx of DM, glaucoma, HTN, HLD, leg edema    PT Comments    Pt IS progressing medically and with his functional mobility but NOT safe to D/C back home due to gait instability, weakness and impaired balance.  Pt needs ST Rehab at SNF and shows good potential to regain his prior level of Indep.   Recommendations for follow up therapy are one component of a multi-disciplinary discharge planning process, led by the attending physician.  Recommendations may be updated based on patient status, additional functional criteria and insurance authorization.  Follow Up Recommendations  Skilled nursing-short term rehab (<3 hours/day) Can patient physically be transported by private vehicle: Yes   Assistance Recommended at Discharge Intermittent Supervision/Assistance  Patient can return home with the following A little help with walking and/or transfers;A little help with bathing/dressing/bathroom;Assistance with cooking/housework;Help with stairs or ramp for entrance   Equipment Recommendations  Rolling walker (2 wheels)    Recommendations for Other Services       Precautions / Restrictions Precautions Precautions: Fall Precaution Comments: Chronic L foot drop (declines AFO) Restrictions Weight Bearing Restrictions: No Other Position/Activity Restrictions: chronic L foot drop     Mobility  Bed Mobility               General bed mobility comments: OOB in recliner    Transfers Overall transfer level: Needs assistance Equipment used: Rolling walker (2 wheels) Transfers: Sit to/from Stand Sit to Stand: Supervision, Min guard           General transfer comment: increased time to rise and VC's on safety with turns     Ambulation/Gait Ambulation/Gait assistance: Min guard, Supervision Gait Distance (Feet): 155 Feet Assistive device: Rolling walker (2 wheels) Gait Pattern/deviations: Step-through pattern, Decreased stride length, Decreased dorsiflexion - right, Decreased dorsiflexion - left Gait velocity: decreased     General Gait Details: pt making slow progress with his mobility.  Present with L chronic footdrop in which he compensates by "hip hiking".  Pt present with forward flex posture and heavy lean on walker.   Stairs             Wheelchair Mobility    Modified Rankin (Stroke Patients Only)       Balance                                            Cognition Arousal/Alertness: Awake/alert Behavior During Therapy: WFL for tasks assessed/performed                                   General Comments: pleasant, cooperative, able to follow 2 - 3  step commands consistently.  Motivated.        Exercises      General Comments        Pertinent Vitals/Pain Pain Assessment Pain Assessment: Faces Faces Pain Scale: Hurts a little bit Pain Location: R knee Pain Descriptors / Indicators: Discomfort, Aching Pain Intervention(s): Monitored during session, Repositioned    Home Living  Prior Function            PT Goals (current goals can now be found in the care plan section) Progress towards PT goals: Progressing toward goals    Frequency    Min 2X/week      PT Plan Current plan remains appropriate    Co-evaluation              AM-PAC PT "6 Clicks" Mobility   Outcome Measure  Help needed turning from your back to your side while in a flat bed without using bedrails?: A Little Help needed moving from lying on your back to sitting on the side of a flat bed without using bedrails?: A Little Help needed moving to and from a bed to a chair (including a wheelchair)?: A Little Help needed  standing up from a chair using your arms (e.g., wheelchair or bedside chair)?: A Little Help needed to walk in hospital room?: A Little Help needed climbing 3-5 steps with a railing? : A Lot 6 Click Score: 17    End of Session Equipment Utilized During Treatment: Gait belt Activity Tolerance: Patient tolerated treatment well Patient left: with chair alarm set;in chair;with call bell/phone within reach Nurse Communication: Mobility status PT Visit Diagnosis: Other abnormalities of gait and mobility (R26.89);Muscle weakness (generalized) (M62.81)     Time: 6811-5726 PT Time Calculation (min) (ACUTE ONLY): 16 min  Charges:  $Gait Training: 8-22 mins                    Rica Koyanagi  PTA [place] [facility] M-F          [phone] Weekend pager [phone]

## 2022-07-05 DIAGNOSIS — M6282 Rhabdomyolysis: Secondary | ICD-10-CM | POA: Diagnosis not present

## 2022-07-05 LAB — GLUCOSE, CAPILLARY
Glucose-Capillary: 110 mg/dL — ABNORMAL HIGH (ref 70–99)
Glucose-Capillary: 129 mg/dL — ABNORMAL HIGH (ref 70–99)
Glucose-Capillary: 146 mg/dL — ABNORMAL HIGH (ref 70–99)
Glucose-Capillary: 166 mg/dL — ABNORMAL HIGH (ref 70–99)
Glucose-Capillary: 268 mg/dL — ABNORMAL HIGH (ref 70–99)

## 2022-07-05 NOTE — Progress Notes (Signed)
PROGRESS NOTE Derrick Wilson  BUL:[id number] DOB: 1934/04/21 DOA: 06/24/2022 PCP: Donnajean Lopes, MD   Brief Narrative/Hospital Course: 88 year old with history of DM2, glaucoma, HTN, HLD admitted to the hospital after having multiple falls at home. He was found to have rhabdomyolysis with CPK greater than 2000, started on IV fluids. Slowly has rhabdomyolysis resolved but did suffer from bilateral lower extremity swelling secondary to IV fluids. Echocardiogram showed preserved EF with grade 1 DD. PT/OT recommended SNF, awaiting placement> insurance denied, family has appealed 07/04/22   Subjective: Seen and examined this morning resting comfortably he is hopeful about appeal. No complaints.   Very much wants to go to rehab where his wife is there too  Assessment and Plan: Principal Problem:   Rhabdomyolysis Active Problems:   Unspecified glaucoma   DM2 (diabetes mellitus, type 2) ([place])   Essential hypertension   Falls   HLD (hyperlipidemia)  Rhabdomyolysis: Resolved with IV fluids. Falls:No acute signs of trauma  PT/OT recommending SNF, TOC working on placement-filed for appeal as SNF has been denied by insurance Demand ischemia with elevated troponin echo with preserved EF G1 DD.  No chest pain. Bilateral lower extremity edema:IVF was discontinued echo stable as above.  BNP was elevated.Patient was given IV Lasix. Now on lasix '20mg'$  daily cont same, cont to elevate leg.  Continue compression stocking.  Asked nurse to check today's weight.  DM2 insulin-dependent, hyperglycemia: Last A1c 8.0, blood sugar currently well-controlled on NovoLog 3 units Premeal and Semglee 16 units daily, SSI at home on metformin, Jardiance, Lantus 30 units daily, glipizide and Ozempic. He confirms that he takes all of these medications>but on discharge will discontinue glipizide,cut down Levemir to current dose.Monitor blood sugar as below  Recent Labs  Lab 07/04/22 1759 07/04/22 2108 07/05/22 0448  07/05/22 0730 07/05/22 1131  GLUCAP 219* 214* 110* 129* 146*  HTN: stable. Meds on hold.  Now on p.o. Lasix Glaucoma:Resume home med  DVT prophylaxis: enoxaparin (LOVENOX) injection 40 mg Start: 06/24/22 1800 Code Status:   Code Status: Full Code Family Communication: plan of care discussed with patient at bedside. Patient status is: Inpatient because of awaiting placement Level of care: Med-Surg   Dispo: The patient is from: home            Anticipated disposition: SNF, once bed available, appeal filed 12/18  Objective: Vitals last 24 hrs: Vitals:   07/04/22 0307 07/04/22 1357 07/04/22 2109 07/05/22 0451  BP: 115/64 115/60 118/68 128/73  Pulse: 79 93 75 79  Resp: '16 16 18 18  '$ Temp: (!) 97.5 F (36.4 C) 98.3 F (36.8 C) 98.3 F (36.8 C) 98.5 F (36.9 C)  TempSrc: Oral Oral Oral Oral  SpO2: 96% 94% 96% 95%  Weight:      Height:       Weight change:   Physical Examination: General exam: AAox3, weak,older appearing HEENT:Oral mucosa moist, Ear/Nose WNL grossly, dentition normal. Respiratory system: bilaterally clear BS, no use of accessory muscle Cardiovascular system: S1 & S2 +, regular rate, JVD neg. Gastrointestinal system: Abdomen soft,NT,ND,BS+ Nervous System:Alert, awake, moving extremities and grossly nonfocal Extremities: LE ankle edema +, lower extremities warm Skin: No rashes,no icterus. MSK: Normal muscle bulk,tone, power   Medications reviewed:  Scheduled Meds:  aspirin  81 mg Oral QHS   clopidogrel  75 mg Oral Daily   enoxaparin (LOVENOX) injection  40 mg Subcutaneous Q24H   furosemide  20 mg Oral Daily   insulin aspart  0-5 Units Subcutaneous QHS  insulin aspart  0-9 Units Subcutaneous TID WC   insulin aspart  3 Units Subcutaneous TID WC   insulin glargine-yfgn  16 Units Subcutaneous Daily   timolol  1 drop Both Eyes Daily   Continuous Infusions:    Diet Order             Diet Carb Modified Fluid consistency: Thin; Room service appropriate?  Yes  Diet effective now                  Intake/Output Summary (Last 24 hours) at 07/05/2022 1147 Last data filed at 07/05/2022 0900 Gross per 24 hour  Intake 240 ml  Output 925 ml  Net -685 ml   Net IO Since Admission: -2,724 mL [07/05/22 1147]  Wt Readings from Last 3 Encounters:  06/24/22 90.7 kg  04/16/19 94.5 kg  09/13/18 103 kg     Unresulted Labs (From admission, onward)    None     Data Reviewed: I have personally reviewed following labs and imaging studies CBC: Recent Labs  Lab 06/29/22 0759 07/02/22 1107  WBC 6.5 6.6  HGB 14.2 15.5  HCT 42.9 47.1  MCV 91.1 91.6  PLT 165 947   Basic Metabolic Panel: Recent Labs  Lab 06/29/22 0534 06/30/22 0621 07/01/22 0745 07/02/22 1107 07/03/22 0729  NA 139 140 138 137 136  K 4.1 3.9 4.1 4.0 4.4  CL 107 106 104 101 98  CO2 '25 28 28 29 '$ 32  GLUCOSE 142* 155* 162* 261* 121*  BUN '21 21 15 '$ 24* 26*  CREATININE 0.64 0.54* 0.49* 0.77 0.73  CALCIUM 8.5* 8.8* 9.0 9.0 8.8*  MG 1.9 1.9 2.0 2.0 2.2   GFR: Estimated Creatinine Clearance: 73.8 mL/min (by C-G formula based on SCr of 0.73 mg/dL). Liver Function Tests: No results for input(s): "AST", "ALT", "ALKPHOS", "BILITOT", "PROT", "ALBUMIN" in the last 168 hours. Radiology Studies: No results found.   LOS: 11 days   Antonieta Pert, MD [facility]  07/05/2022, 11:47 AM

## 2022-07-06 DIAGNOSIS — M6282 Rhabdomyolysis: Secondary | ICD-10-CM | POA: Diagnosis not present

## 2022-07-06 LAB — GLUCOSE, CAPILLARY
Glucose-Capillary: 136 mg/dL — ABNORMAL HIGH (ref 70–99)
Glucose-Capillary: 145 mg/dL — ABNORMAL HIGH (ref 70–99)
Glucose-Capillary: 210 mg/dL — ABNORMAL HIGH (ref 70–99)
Glucose-Capillary: 185 mg/dL — ABNORMAL HIGH (ref 70–99)
Glucose-Capillary: 235 mg/dL — ABNORMAL HIGH (ref 70–99)

## 2022-07-06 NOTE — Progress Notes (Signed)
Occupational Therapy Treatment Patient Details Name: Derrick Wilson MRN: [id number] DOB: 1933-11-26 Today's Date: 07/06/2022   History of present illness Pt is 88 yo male admitted 06/24/22 after 2 recent falls with rhabdomyolysis.  Pt has hx of DM, glaucoma, HTN, HLD, leg edema   OT comments  Treatment focused on ADLs, balance and safety. Patient able to transfer to edge of bed and don shoes in seated position. Min guard to stand with walker and ambulate to bathroom and perform toilet transfer. He required heavy assist on grab bar to transfer on and off of toilet. Patient able to wipe himself in seated position but unsteady with standing. Patient able to ambulate back to recliner. He has existing left drop foot - suspect he may need bracing to improve his balance. With performing a pants donning task patient able to get "clothing "over feet in seated position but once attempting to stand to pull clothing up patient had overt loss of balance requiring therapist to assist with correcting balance. Currently patient does not have the physical abilities to return home due to deficits with independence with ambulation and ADLs. He is a high fall risk. He doesn't have the physical abilities to safely manage ADLs let alone IADLs. Continue to recommend short term rehab at discharge.    Recommendations for follow up therapy are one component of a multi-disciplinary discharge planning process, led by the attending physician.  Recommendations may be updated based on patient status, additional functional criteria and insurance authorization.    Follow Up Recommendations  Skilled nursing-short term rehab (<3 hours/day)     Assistance Recommended at Discharge Frequent or constant Supervision/Assistance  Patient can return home with the following  A little help with walking and/or transfers;A little help with bathing/dressing/bathroom;Assistance with cooking/housework;Direct supervision/assist for medications  management;Assist for transportation;Help with stairs or ramp for entrance;Direct supervision/assist for financial management   Equipment Recommendations       Recommendations for Other Services      Precautions / Restrictions Precautions Precautions: Fall Precaution Comments: Chronic L foot drop (declines AFO) Restrictions Weight Bearing Restrictions: No Other Position/Activity Restrictions: chronic L foot drop       Mobility Bed Mobility                    Transfers                         Balance Overall balance assessment: Needs assistance Sitting-balance support: No upper extremity supported, Feet supported Sitting balance-Leahy Scale: Good     Standing balance support: Reliant on assistive device for balance, During functional activity Standing balance-Leahy Scale: Poor Standing balance comment: at times requiring outside assistance                           ADL either performed or assessed with clinical judgement   ADL Overall ADL's : Needs assistance/impaired                     Lower Body Dressing: Minimal assistance;Sit to/from stand Lower Body Dressing Details (indicate cue type and reason): able to don shoes in seated position. When performing donning pseudo pants) - when attempted to pull pants up over hips - he lost his balance requiring therapist to steady him. Toilet Transfer: Financial planner (2 wheels);Grab bars Toilet Transfer Details (indicate cue type and reason): Heavy reliant and grab bar to transfer on and  off toilet - min guard from therapist. [place] and Hygiene: Sit to/from stand;Moderate assistance Toileting - Clothing Manipulation Details (indicate cue type and reason): Patient able to wipe himself in seated position but needs assistance for clothing management in standing     Functional mobility during ADLs: Min guard;Rolling walker (2 wheels) General ADL  Comments: Patient min guard with walker to ambualte to the bathroom and for toilet transfer. Min assist required to steady patient when he lost his balance performing pant task.    Extremity/Trunk Assessment Upper Extremity Assessment Upper Extremity Assessment: Overall WFL for tasks assessed   Lower Extremity Assessment Lower Extremity Assessment: Defer to PT evaluation   Cervical / Trunk Assessment Cervical / Trunk Assessment: Kyphotic    Vision   Vision Assessment?: No apparent visual deficits   Perception     Praxis      Cognition Arousal/Alertness: Awake/alert Behavior During Therapy: WFL for tasks assessed/performed Overall Cognitive Status: Within Functional Limits for tasks assessed                                          Exercises      Shoulder Instructions       General Comments      Pertinent Vitals/ Pain       Pain Assessment Pain Assessment: No/denies pain  Home Living                                          Prior Functioning/Environment              Frequency  Min 2X/week        Progress Toward Goals  OT Goals(current goals can now be found in the care plan section)  Progress towards OT goals: Progressing toward goals  Acute Rehab OT Goals Patient Stated Goal: not fall again OT Goal Formulation: With patient Time For Goal Achievement: 07/10/22 Potential to Achieve Goals: Good  Plan Discharge plan remains appropriate    Co-evaluation                 AM-PAC OT "6 Clicks" Daily Activity     Outcome Measure   Help from another person eating meals?: None Help from another person taking care of personal grooming?: A Little Help from another person toileting, which includes using toliet, bedpan, or urinal?: A Little Help from another person bathing (including washing, rinsing, drying)?: A Little Help from another person to put on and taking off regular upper body clothing?: A Little Help  from another person to put on and taking off regular lower body clothing?: A Little 6 Click Score: 19    End of Session Equipment Utilized During Treatment: Gait belt;Rolling walker (2 wheels)  OT Visit Diagnosis: Unsteadiness on feet (R26.81);Muscle weakness (generalized) (M62.81);History of falling (Z91.81)   Activity Tolerance Patient tolerated treatment well   Patient Left in chair;with call bell/phone within reach;with chair alarm set   Nurse Communication Mobility status        Time: 4854-6270 OT Time Calculation (min): 26 min  Charges: OT General Charges $OT Visit: 1 Visit OT Treatments $Self Care/Home Management : 23-37 mins  Gustavo Lah, OTR/L [place]  Office [phone]   Lenward Chancellor 07/06/2022, 4:24 PM

## 2022-07-06 NOTE — Care Management Important Message (Signed)
Important Message  Patient Details IM Letter given. Name: VERGIL BURBY MRN: [id number] Date of Birth: [birth year]   Medicare Important Message Given:  Yes     Kerin Salen 07/06/2022, 9:22 AM

## 2022-07-06 NOTE — Progress Notes (Signed)
PROGRESS NOTE Derrick Wilson  ZOX:[id number] DOB: 1934-03-18 DOA: 06/24/2022 PCP: Donnajean Lopes, MD   Brief Narrative/Hospital Course: 88 year old with history of DM2, glaucoma, HTN, HLD admitted to the hospital after having multiple falls at home. He was found to have rhabdomyolysis with CPK greater than 2000, started on IV fluids. Slowly has rhabdomyolysis resolved but did suffer from bilateral lower extremity swelling secondary to IV fluids. Echocardiogram showed preserved EF with grade 1 DD. PT/OT recommended SNF, awaiting placement> insurance denied, family has appealed 07/04/22   Subjective: Seen and examined.  Resting comfortably leg edema has much improved Overnight afebrile BP stable blood sugar running in 130s to 260s  Assessment and Plan: Principal Problem:   Rhabdomyolysis Active Problems:   Unspecified glaucoma   DM2 (diabetes mellitus, type 2) (HCC)   Essential hypertension   Falls   HLD (hyperlipidemia)  Rhabdomyolysis: resolved. Off IV fluids. Falls/deconditioning debility:No acute signs of trauma  PT/OT recommending SNF, TOC working on placement-filed for appeal as SNF has been denied by insurance 07/04/22  Demand ischemia with elevated troponin echo with preserved EF G1 DD.  No chest pain. Bilateral lower extremity edema:IVF was discontinued echo stable as above.  BNP was elevated.Patient was given IV Lasix. Now on lasix '20mg'$  daily cont same, cont to elevate leg.  Continue compression stocking> asked nurse to place him on this morning.Overall weight remains stable.  Monitor RFT intermittently  DM2 insulin-dependent, hyperglycemia: Last A1c 8.0, blood sugar fairly controlled on Premeal 3 units insulin and Semglee 16 units daily, plus SSI. At home on metformin, Jardiance, Lantus 30 units daily, glipizide and Ozempic. He confirms that he takes all of these medications> since blood sugar stable on current regimen on discharge will discontinue glipizide,cut down Levemir  to current dose.Monitor blood sugar as below  Recent Labs  Lab 07/05/22 1131 07/05/22 1650 07/05/22 2117 07/06/22 0430 07/06/22 0801  GLUCAP 146* 166* 268* 136* 145*   HTN: stable. Meds on hold.  Now on p.o. Lasix Glaucoma:Resume home med  DVT prophylaxis: enoxaparin (LOVENOX) injection 40 mg Start: 06/24/22 1800 Code Status:   Code Status: Full Code Family Communication: plan of care discussed with patient at bedside. Patient status is: Inpatient because of awaiting placement Level of care: Med-Surg   Dispo: The patient is from: home            Anticipated disposition: SNF, once bed available, appeal filed 12/18  Objective: Vitals last 24 hrs: Vitals:   07/05/22 1300 07/05/22 1341 07/05/22 2019 07/06/22 0432  BP:  (!) 124/58 135/78 136/76  Pulse:  84 82 70  Resp:  (!) '22 18 18  '$ Temp:  98.9 F (37.2 C) 98.5 F (36.9 C) 97.7 F (36.5 C)  TempSrc:  Oral Oral Oral  SpO2:  94% 97% 96%  Weight: 89 kg     Height:       Weight change:   Physical Examination: General exam: AA, weak,older appearing HEENT:Oral mucosa moist, Ear/Nose WNL grossly, dentition normal. Respiratory system: bilaterally clear BS, no use of accessory muscle Cardiovascular system: S1 & S2 +, regular rate, JVD neg. Gastrointestinal system: Abdomen soft,NT,ND,BS+ Nervous System:Alert, awake, moving extremities and grossly nonfocal Extremities: LE ankle edema mild , lower extremities warm Skin: No rashes,no icterus. MSK: Normal muscle bulk,tone, power   Medications reviewed:  Scheduled Meds:  aspirin  81 mg Oral QHS   clopidogrel  75 mg Oral Daily   enoxaparin (LOVENOX) injection  40 mg Subcutaneous Q24H   furosemide  20 mg  Oral Daily   insulin aspart  0-5 Units Subcutaneous QHS   insulin aspart  0-9 Units Subcutaneous TID WC   insulin aspart  3 Units Subcutaneous TID WC   insulin glargine-yfgn  16 Units Subcutaneous Daily   timolol  1 drop Both Eyes Daily   Continuous Infusions:    Diet  Order             Diet Carb Modified Fluid consistency: Thin; Room service appropriate? Yes  Diet effective now                  Intake/Output Summary (Last 24 hours) at 07/06/2022 0903 Last data filed at 07/06/2022 4496 Gross per 24 hour  Intake --  Output 350 ml  Net -350 ml    Net IO Since Admission: -3,074 mL [07/06/22 0903]  Wt Readings from Last 3 Encounters:  07/05/22 89 kg  04/16/19 94.5 kg  09/13/18 103 kg     Unresulted Labs (From admission, onward)    None     Data Reviewed: I have personally reviewed following labs and imaging studies CBC: Recent Labs  Lab 07/02/22 1107  WBC 6.6  HGB 15.5  HCT 47.1  MCV 91.6  PLT 759    Basic Metabolic Panel: Recent Labs  Lab 06/30/22 0621 07/01/22 0745 07/02/22 1107 07/03/22 0729  NA 140 138 137 136  K 3.9 4.1 4.0 4.4  CL 106 104 101 98  CO2 '28 28 29 '$ 32  GLUCOSE 155* 162* 261* 121*  BUN 21 15 24* 26*  CREATININE 0.54* 0.49* 0.77 0.73  CALCIUM 8.8* 9.0 9.0 8.8*  MG 1.9 2.0 2.0 2.2    GFR: Estimated Creatinine Clearance: 73.1 mL/min (by C-G formula based on SCr of 0.73 mg/dL). Liver Function Tests: No results for input(s): "AST", "ALT", "ALKPHOS", "BILITOT", "PROT", "ALBUMIN" in the last 168 hours. Radiology Studies: No results found.   LOS: 12 days   Antonieta Pert, MD [facility]  07/06/2022, 9:03 AM

## 2022-07-06 NOTE — TOC Progression Note (Addendum)
Transition of Care [facility]) - Progression Note    Patient Details  Name: Derrick Wilson MRN: [id number] Date of Birth: [birth year]  Transition of Care [facility]) CM/SW [place], RN Phone Number:[phone]  07/06/2022, 8:39 AM  Clinical Narrative:    Insurance appeal is still pending Angelina ID [id number]  [place] still pending  1638 CM received message from [facility] stating that clinical documentation was needed by end of day today for decision on appeal. CM called [employer] spoke with Stacie Iran. Clinical documentation has been emailed and faxed and CM called to verify that information has been received. Information has been received.         Expected Discharge Plan and Services                                                 Social Determinants of Health (SDOH) Interventions    Readmission Risk Interventions     No data to display

## 2022-07-07 DIAGNOSIS — R262 Difficulty in walking, not elsewhere classified: Secondary | ICD-10-CM | POA: Diagnosis not present

## 2022-07-07 DIAGNOSIS — R2689 Other abnormalities of gait and mobility: Secondary | ICD-10-CM | POA: Diagnosis not present

## 2022-07-07 DIAGNOSIS — R531 Weakness: Secondary | ICD-10-CM | POA: Diagnosis not present

## 2022-07-07 DIAGNOSIS — R2243 Localized swelling, mass and lump, lower limb, bilateral: Secondary | ICD-10-CM | POA: Diagnosis not present

## 2022-07-07 DIAGNOSIS — M6282 Rhabdomyolysis: Secondary | ICD-10-CM | POA: Diagnosis not present

## 2022-07-07 DIAGNOSIS — R2232 Localized swelling, mass and lump, left upper limb: Secondary | ICD-10-CM | POA: Diagnosis not present

## 2022-07-07 DIAGNOSIS — R278 Other lack of coordination: Secondary | ICD-10-CM | POA: Diagnosis not present

## 2022-07-07 DIAGNOSIS — G459 Transient cerebral ischemic attack, unspecified: Secondary | ICD-10-CM | POA: Diagnosis not present

## 2022-07-07 DIAGNOSIS — N400 Enlarged prostate without lower urinary tract symptoms: Secondary | ICD-10-CM | POA: Diagnosis not present

## 2022-07-07 DIAGNOSIS — I10 Essential (primary) hypertension: Secondary | ICD-10-CM | POA: Diagnosis not present

## 2022-07-07 DIAGNOSIS — E119 Type 2 diabetes mellitus without complications: Secondary | ICD-10-CM | POA: Diagnosis not present

## 2022-07-07 DIAGNOSIS — E785 Hyperlipidemia, unspecified: Secondary | ICD-10-CM | POA: Diagnosis not present

## 2022-07-07 DIAGNOSIS — H409 Unspecified glaucoma: Secondary | ICD-10-CM | POA: Diagnosis not present

## 2022-07-07 DIAGNOSIS — Z9181 History of falling: Secondary | ICD-10-CM | POA: Diagnosis not present

## 2022-07-07 DIAGNOSIS — R2681 Unsteadiness on feet: Secondary | ICD-10-CM | POA: Diagnosis not present

## 2022-07-07 DIAGNOSIS — Z743 Need for continuous supervision: Secondary | ICD-10-CM | POA: Diagnosis not present

## 2022-07-07 DIAGNOSIS — M6281 Muscle weakness (generalized): Secondary | ICD-10-CM | POA: Diagnosis not present

## 2022-07-07 DIAGNOSIS — M62542 Muscle wasting and atrophy, not elsewhere classified, left hand: Secondary | ICD-10-CM | POA: Diagnosis not present

## 2022-07-07 LAB — GLUCOSE, CAPILLARY
Glucose-Capillary: 100 mg/dL — ABNORMAL HIGH (ref 70–99)
Glucose-Capillary: 182 mg/dL — ABNORMAL HIGH (ref 70–99)

## 2022-07-07 MED ORDER — FUROSEMIDE 20 MG PO TABS: 20.0000 mg | ORAL_TABLET | Freq: Every day | ORAL | 0 refills | Status: AC

## 2022-07-07 NOTE — TOC Transition Note (Addendum)
Transition of Care [facility]) - CM/SW Discharge Note   Patient Details  Name: Derrick Wilson MRN: [id number] Date of Birth: 05-15-1934  Transition of Care [facility]) CM/SW Contact:  Angelita Ingles, RN Phone Number:[phone]  07/07/2022, 11:17 AM   Clinical Narrative:    Patient is good for discharge to [facility] per  [place]. Transportation has been arranged per [facility]. Discharge packet is in patients chart.  Please call report to  [facility] [phone] Room 412 P         Patient Goals and CMS Choice          Discharge Placement                       Discharge Plan and Services                                     Social Determinants of Health (SDOH) Interventions     Readmission Risk Interventions     No data to display

## 2022-07-07 NOTE — Progress Notes (Signed)
Physical Therapy Treatment Patient Details Name: Derrick Wilson MRN: [id number] DOB: 03-Jul-1934 Today's Date: 07/07/2022   History of Present Illness Pt is 88 yo male admitted 06/24/22 after 2 recent falls with rhabdomyolysis.  Pt has hx of DM, glaucoma, HTN, HLD, leg edema    PT Comments    Patient able to ambulate in hallway though L foot did trip with mild LOB and minguard for recovery.  He had small amount of incontinence on EOB trying to use the urinal and he continues to brace his legs against EOB and chair with sit to stand.  Remains high fall risk and noted approval for SNF with plans to transition today.  Continue to feel he will benefit from STSNF level rehab prior to d/c home.    Recommendations for follow up therapy are one component of a multi-disciplinary discharge planning process, led by the attending physician.  Recommendations may be updated based on patient status, additional functional criteria and insurance authorization.  Follow Up Recommendations  Skilled nursing-short term rehab (<3 hours/day) Can patient physically be transported by private vehicle: Yes   Assistance Recommended at Discharge Intermittent Supervision/Assistance  Patient can return home with the following A little help with walking and/or transfers;A little help with bathing/dressing/bathroom;Assistance with cooking/housework;Help with stairs or ramp for entrance   Equipment Recommendations  Rolling walker (2 wheels)    Recommendations for Other Services       Precautions / Restrictions Precautions Precautions: Fall Precaution Comments: Chronic L foot drop (declines AFO)     Mobility  Bed Mobility Overal bed mobility: Needs Assistance       Supine to sit: Supervision     General bed mobility comments: increased time, Pt sitting up as PT entered the room as he needed to use the urinal    Transfers Overall transfer level: Needs assistance Equipment used: Rolling walker (2  wheels) Transfers: Sit to/from Stand, Bed to chair/wheelchair/BSC Sit to Stand: Min guard   Step pivot transfers: Min guard       General transfer comment: leaning heavily forward, using UE's then bracing back of his legs against the bed hinging to come upright; up to stand and use RW to step to [facility]    Ambulation/Gait Ambulation/Gait assistance: Min guard Gait Distance (Feet): 155 Feet Assistive device: Rolling walker (2 wheels) Gait Pattern/deviations: Step-through pattern, Decreased stride length, Decreased dorsiflexion - right, Decreased dorsiflexion - left, Steppage, Wide base of support, Trunk flexed       General Gait Details: cues for posture, one LOB with L foot catching the floor min guard for safety pt also recovered, but reports it happens and he can fall at home   Stairs             Wheelchair Mobility    Modified Rankin (Stroke Patients Only)       Balance Overall balance assessment: Needs assistance Sitting-balance support: Feet supported Sitting balance-Leahy Scale: Good     Standing balance support: Bilateral upper extremity supported, Reliant on assistive device for balance Standing balance-Leahy Scale: Poor Standing balance comment: heavily reliant on RW                            Cognition Arousal/Alertness: Awake/alert Behavior During Therapy: WFL for tasks assessed/performed Overall Cognitive Status: Within Functional Limits for tasks assessed  Exercises General Exercises - Lower Extremity Long Arc Quad: AROM, Both, Strengthening, 10 reps, Seated Hip ABduction/ADduction: AROM, Strengthening, Both, Standing, 10 reps Hip Flexion/Marching: 10 reps, Both, Standing, Strengthening Heel Raises: Strengthening, Both, Seated, 10 reps Mini-Sqauts: Strengthening, Both, 10 reps, Standing Other Exercises Other Exercises: Issued red t-band and demonstrated scapular retraction,  bicep curls, tricep extension as pt needing to use BSC    General Comments General comments (skin integrity, edema, etc.): Toileted on his own with urinal at EOB, but some initial incontinence while PT emptying urinal.      Pertinent Vitals/Pain Pain Assessment Faces Pain Scale: No hurt    Home Living                          Prior Function            PT Goals (current goals can now be found in the care plan section) Progress towards PT goals: Progressing toward goals    Frequency    Min 2X/week      PT Plan Current plan remains appropriate    Co-evaluation              AM-PAC PT "6 Clicks" Mobility   Outcome Measure  Help needed turning from your back to your side while in a flat bed without using bedrails?: A Little Help needed moving from lying on your back to sitting on the side of a flat bed without using bedrails?: A Little Help needed moving to and from a bed to a chair (including a wheelchair)?: A Little Help needed standing up from a chair using your arms (e.g., wheelchair or bedside chair)?: A Little Help needed to walk in hospital room?: A Little Help needed climbing 3-5 steps with a railing? : A Lot 6 Click Score: 17    End of Session Equipment Utilized During Treatment: Gait belt Activity Tolerance: Patient tolerated treatment well Patient left: in chair;with family/visitor present Nurse Communication: Other (comment) (on BSC) PT Visit Diagnosis: Other abnormalities of gait and mobility (R26.89);Muscle weakness (generalized) (M62.81)     Time: 1497-0263 PT Time Calculation (min) (ACUTE ONLY): 32 min  Charges:  $Gait Training: 8-22 mins $Therapeutic Exercise: 8-22 mins                     Derrick Wilson, PT Acute Rehabilitation Services Office:[phone] 07/07/2022    Derrick Wilson 07/07/2022, 1:09 PM

## 2022-07-07 NOTE — Progress Notes (Signed)
Trying calling facility to give report. There was no answer at this time. Left phone number for report

## 2022-07-07 NOTE — TOC Progression Note (Addendum)
Transition of Care [facility]) - Progression Note    Patient Details  Name: Derrick Wilson MRN: [id number] Date of Birth: 11/10/1933  Transition of Care [facility]) CM/SW [place], RN Phone Number:[phone]  07/07/2022, 11:09 AM  Clinical Narrative:    CM received message from [facility] stating that patient has been approved for SNF stay. Approval verified per [facility] portal 12/21/-12/27 Derrick Wilson ID [id number]. MD , patient , son and Derrick Wilson have been made aware of approval. Per Derrick Wilson at [facility] patient can come to facility today.  1112 D/c summary has been faxed to facility via hub.    Planned Disposition: Home    Expected Discharge Plan and Services         Expected Discharge Date: 07/07/22                                     Social Determinants of Health (SDOH) Interventions SDOH Screenings   Food Insecurity: No Food Insecurity (06/27/2022)  Housing: Low Risk  (06/27/2022)  Transportation Needs: No Transportation Needs (06/27/2022)  Utilities: Not At Risk (06/27/2022)  Tobacco Use: Low Risk  (06/24/2022)    Readmission Risk Interventions     No data to display

## 2022-07-07 NOTE — Discharge Summary (Signed)
Physician Discharge Summary  Fernandez Kenley Nuttle DQQ:[id number] DOB: 21-Oct-1933 DOA: 06/24/2022  PCP: Donnajean Lopes, MD  Admit date: 06/24/2022 Discharge date: 07/07/2022 Recommendations for Outpatient Follow-up:  Follow up with PCP in 1 weeks-call for appointment Please obtain BMP/CBC in one week  Discharge Dispo: snf Discharge Condition: Stable Code Status:   Code Status: Full Code Diet recommendation:  Diet Order             Diet Carb Modified Fluid consistency: Thin; Room service appropriate? Yes  Diet effective now                    Brief/Interim Summary: 88 year old with history of DM2, glaucoma, HTN, HLD admitted to the hospital after having multiple falls at home. He was found to have rhabdomyolysis with CPK greater than 2000, started on IV fluids. Slowly has rhabdomyolysis resolved but did suffer from bilateral lower extremity swelling secondary to IV fluids. Echocardiogram showed preserved EF with grade 1 DD. PT/OT recommended SNF, awaiting placement> insurance denied, family has appealed 07/04/22 Remains medically stable, snf appeal was successful and is being discharged to the   Discharge Diagnoses:  Principal Problem:   Rhabdomyolysis Active Problems:   Unspecified glaucoma   DM2 (diabetes mellitus, type 2) ([place])   Essential hypertension   Falls   HLD (hyperlipidemia)  Rhabdomyolysis: resolved. Off IV fluids. Falls/deconditioning debility:No acute signs of trauma  PT/OT recommending SNF, TOC working on placement-filed for appeal as SNF has been denied by insurance 07/04/22  Demand ischemia with elevated troponin echo with preserved EF G1 DD.  No chest pain. Bilateral lower extremity edema:IVF was discontinued echo stable as above.  BNP was elevated.Patient was given IV Lasix. Now on lasix '20mg'$  daily cont same, cont to elevate leg.  Continue compression stocking> asked nurse to place him on this morning.Overall weight remains stable.  Monitor RFT  intermittently  DM2 insulin-dependent, hyperglycemia: Last A1c 8.0, blood sugar fairly controlled on Premeal 3 units insulin and Semglee 16 units daily, plus SSI. At home on metformin, Jardiance, Lantus 30 units daily, glipizide and Ozempic. He confirms that he takes all of these medications> since blood sugar stable on current regimen on discharge will discontinue glipizide,cut down Levemir to current dose.Monitor blood sugar as below  Recent Labs  Lab 07/06/22 0801 07/06/22 1126 07/06/22 1702 07/06/22 2036 07/07/22 0750  GLUCAP 145* 210* 185* 235* 100*   HTN: stable. Meds on hold.  Now on p.o. Lasix Glaucoma:Resume home med  Consults: ptot Subjective: Alert awake oriented resting comfortably mild leg edema,  Discharge Exam: Vitals:   07/06/22 2039 07/07/22 0548  BP: 123/69 (!) 113/59  Pulse: 74 60  Resp: 19 18  Temp: 98.4 F (36.9 C) 97.7 F (36.5 C)  SpO2: 96% 99%   General: Pt is alert, awake, not in acute distress Cardiovascular: RRR, S1/S2 +, no rubs, no gallops Respiratory: CTA bilaterally, no wheezing, no rhonchi Abdominal: Soft, NT, ND, bowel sounds + Extremities:mild edema, no cyanosis  Discharge Instructions  Discharge Instructions     (HEART FAILURE PATIENTS) Call MD:  Anytime you have any of the following symptoms: 1) 3 pound weight gain in 24 hours or 5 pounds in 1 week 2) shortness of breath, with or without a dry hacking cough 3) swelling in the hands, feet or stomach 4) if you have to sleep on extra pillows at night in order to breathe.   Complete by: As directed    Discharge instructions   Complete by: As  directed    Please call call MD or return to ER for similar or worsening recurring problem that brought you to hospital or if any fever,nausea/vomiting,abdominal pain, uncontrolled pain, chest pain,  shortness of breath or any other alarming symptoms.  Please follow-up your doctor as instructed in a week time and call the office for  appointment.  Please avoid alcohol, smoking, or any other illicit substance and maintain healthy habits including taking your regular medications as prescribed.  You were cared for by a hospitalist during your hospital stay. If you have any questions about your discharge medications or the care you received while you were in the hospital after you are discharged, you can call the unit and ask to speak with the hospitalist on call if the hospitalist that took care of you is not available.  Once you are discharged, your primary care physician will handle any further medical issues. Please note that NO REFILLS for any discharge medications will be authorized once you are discharged, as it is imperative that you return to your primary care physician (or establish a relationship with a primary care physician if you do not have one) for your aftercare needs so that they can reassess your need for medications and monitor your lab values   Increase activity slowly   Complete by: As directed       Allergies as of 07/07/2022   No Known Allergies      Medication List     STOP taking these medications    losartan 100 MG tablet Commonly known as: COZAAR   Ozempic (0.25 or 0.5 MG/DOSE) 2 MG/1.5ML Sopn Generic drug: Semaglutide(0.25 or 0.'5MG'$ /DOS)       TAKE these medications    Alphagan P 0.15 % ophthalmic solution Generic drug: brimonidine Place 1 drop into both eyes at bedtime.   ASPIRIN 81 PO Take 81 mg by mouth at bedtime.   atorvastatin 20 MG tablet Commonly known as: LIPITOR Take 20 mg by mouth every evening.   clopidogrel 75 MG tablet Commonly known as: PLAVIX Take 1 tablet (75 mg total) by mouth daily.   furosemide 20 MG tablet Commonly known as: LASIX Take 1 tablet (20 mg total) by mouth daily for 7 days.   glipiZIDE 5 MG 24 hr tablet Commonly known as: GLUCOTROL XL Take 5 mg by mouth daily.   insulin glargine 100 UNIT/ML injection Commonly known as: Lantus Inject  0.16 mLs (16 Units total) into the skin daily. What changed: how much to take   Invokana 100 MG Tabs tablet Generic drug: canagliflozin Take 100 mg by mouth every evening.   Jardiance 25 MG Tabs tablet Generic drug: empagliflozin Take 25 mg by mouth daily.   metFORMIN 1000 MG tablet Commonly known as: GLUCOPHAGE Take 1,000 mg by mouth daily.   timolol 0.25 % ophthalmic solution Commonly known as: TIMOPTIC Place 1 drop into both eyes daily.        Follow-up Information     Donnajean Lopes, MD Follow up in 1 week(s).   Specialty: Internal Medicine Contact information: [address] [place] [address] [phone]                No Known Allergies  The results of significant diagnostics from this hospitalization (including imaging, microbiology, ancillary and laboratory) are listed below for reference.    Microbiology: No results found for this or any previous visit (from the past 240 hour(s)).  Procedures/Studies: ECHOCARDIOGRAM COMPLETE  Result Date: 06/26/2022  ECHOCARDIOGRAM REPORT   Patient Name:   Derrick Wilson Date of Exam: 06/26/2022 Medical Rec #:  [id number]         Height:       69.0 in Accession #:    [id number]        Weight:       200.0 lb Date of Birth:  December 25, 1933         BSA:          2.066 m Patient Age:    17 years          BP:           129/58 mmHg Patient Gender: M                 HR:           78 bpm. Exam Location:  Inpatient Procedure: 2D Echo, Color Doppler, Cardiac Doppler and Intracardiac            Opacification Agent Indications:    Elevated troponin  History:        Patient has prior history of Echocardiogram examinations, most                 recent 07/30/2018. Risk Factors:Diabetes, Hypertension and                 Dyslipidemia.  Sonographer:    Eartha Inch Referring Phys: [id number] [place]  Sonographer Comments: Technically difficult study due to poor echo windows and no subcostal window. Image acquisition challenging  due to patient body habitus and Image acquisition challenging due to respiratory motion. IMPRESSIONS  1. Left ventricular ejection fraction, by estimation, is 60 to 65%. The left ventricle has normal function. The left ventricle has no regional wall motion abnormalities. There is mild concentric left ventricular hypertrophy. Left ventricular diastolic parameters are consistent with Grade I diastolic dysfunction (impaired relaxation).  2. Right ventricular systolic function is normal. The right ventricular size is normal. Tricuspid regurgitation signal is inadequate for assessing PA pressure.  3. The mitral valve is grossly normal. No evidence of mitral valve regurgitation.  4. The aortic valve is tricuspid. There is mild calcification of the aortic valve. There is mild thickening of the aortic valve. Aortic valve regurgitation is not visualized. Aortic valve sclerosis/calcification is present, without any evidence of aortic stenosis.  5. Aortic dilatation noted. There is borderline dilatation of the ascending aorta, measuring 37 mm.  6. The inferior vena cava is normal in size with greater than 50% respiratory variability, suggesting right atrial pressure of 3 mmHg. Comparison(s): No significant change from prior study. FINDINGS  Left Ventricle: Left ventricular ejection fraction, by estimation, is 60 to 65%. The left ventricle has normal function. The left ventricle has no regional wall motion abnormalities. Definity contrast agent was given IV to delineate the left ventricular  endocardial borders. The left ventricular internal cavity size was normal in size. There is mild concentric left ventricular hypertrophy. Left ventricular diastolic parameters are consistent with Grade I diastolic dysfunction (impaired relaxation). Right Ventricle: The right ventricular size is normal. No increase in right ventricular wall thickness. Right ventricular systolic function is normal. Tricuspid regurgitation signal is inadequate  for assessing PA pressure. Left Atrium: Left atrial size was normal in size. Right Atrium: Right atrial size was normal in size. Pericardium: There is no evidence of pericardial effusion. Mitral Valve: The mitral valve is grossly normal. There is mild thickening of the mitral valve leaflet(s). There is mild calcification  of the mitral valve leaflet(s). No evidence of mitral valve regurgitation. Tricuspid Valve: The tricuspid valve is normal in structure. Tricuspid valve regurgitation is trivial. Aortic Valve: The aortic valve is tricuspid. There is mild calcification of the aortic valve. There is mild thickening of the aortic valve. Aortic valve regurgitation is not visualized. Aortic valve sclerosis/calcification is present, without any evidence of aortic stenosis. Pulmonic Valve: The pulmonic valve was normal in structure. Pulmonic valve regurgitation is trivial. Aorta: Aortic dilatation noted. There is borderline dilatation of the ascending aorta, measuring 37 mm. Venous: The inferior vena cava is normal in size with greater than 50% respiratory variability, suggesting right atrial pressure of 3 mmHg. IAS/Shunts: The atrial septum is grossly normal.  LEFT VENTRICLE PLAX 2D LVIDd:         3.90 cm     Diastology LVIDs:         2.40 cm     LV e' medial:    4.46 cm/s LV PW:         1.10 cm     LV E/e' medial:  15.3 LV IVS:        1.10 cm     LV e' lateral:   7.07 cm/s LVOT diam:     2.10 cm     LV E/e' lateral: 9.6 LV SV:         65 LV SV Index:   31 LVOT Area:     3.46 cm  LV Volumes (MOD) LV vol d, MOD A2C: 30.5 ml LV vol d, MOD A4C: 65.9 ml LV vol s, MOD A2C: 10.7 ml LV vol s, MOD A4C: 19.5 ml LV SV MOD A2C:     19.8 ml LV SV MOD A4C:     65.9 ml LV SV MOD BP:      30.1 ml RIGHT VENTRICLE RV S prime:     12.90 cm/s TAPSE (M-mode): 2.3 cm LEFT ATRIUM             Index        RIGHT ATRIUM           Index LA diam:        3.80 cm 1.84 cm/m   RA Area:     19.50 cm LA Vol (A2C):   41.5 ml 20.09 ml/m  RA Volume:    54.40 ml  26.33 ml/m LA Vol (A4C):   47.0 ml 22.75 ml/m LA Biplane Vol: 47.4 ml 22.94 ml/m  AORTIC VALVE LVOT Vmax:   89.40 cm/s LVOT Vmean:  70.100 cm/s LVOT VTI:    0.187 m  AORTA Ao Root diam: 3.30 cm Ao Asc diam:  3.25 cm MITRAL VALVE MV Area (PHT): 2.24 cm     SHUNTS MV Decel Time: 338 msec     Systemic VTI:  0.19 m MV E velocity: 68.10 cm/s   Systemic Diam: 2.10 cm MV A velocity: 102.00 cm/s MV E/A ratio:  0.67 Gwyndolyn Kaufman MD Electronically signed by Gwyndolyn Kaufman MD Signature Date/Time: 06/26/2022/12:59:48 PM    Final    CT Lumbar Spine Wo Contrast  Result Date: 06/24/2022 CLINICAL DATA:  Ataxia.  Multiple Falls EXAM: CT LUMBAR SPINE WITHOUT CONTRAST TECHNIQUE: Multidetector CT imaging of the lumbar spine was performed without intravenous contrast administration. Multiplanar CT image reconstructions were also generated. RADIATION DOSE REDUCTION: This exam was performed according to the departmental dose-optimization program which includes automated exposure control, adjustment of the mA and/or kV according to patient size and/or use of iterative reconstruction  technique. COMPARISON:  None Available. FINDINGS: Segmentation: 5 lumbar type vertebrae. Alignment: There is trace retrolisthesis of L3 on L4. Grade 1 anterolisthesis of S1 on S2. Vertebrae: There is age indeterminate, but likely subacute to chronic appearing anterior superior endplate compression deformities at T11, T12, and L1. there are multilevel degenerative endplate changes, most significant at S1-S2 where there is sclerosis and cortical irregularity. There is no evidence of surrounding soft tissue stranding to definitively suggest the presence of discitis osteomyelitis. Paraspinal and other soft tissues: Status post cholecystectomy. Aortic and pelvic vasculature atherosclerotic calcifications. Disc levels: There are multilevel degenerative changes with disc space loss at nearly every lumbar vertebral body level. There is severe  left-sided neural foraminal stenosis at L4-L5, L5-S1, and S1-S2. There is severe right-sided neural foraminal stenosis at L3-L4 L4-L5, L5-S1 and S1-S2. There are multilevel degenerative disc bulges without evidence of high-grade spinal canal stenosis. IMPRESSION: 1. Age-indeterminate, but likely subacute to chronic appearing anterior superior endplate compression deformities at T11, T12, and L1. Correlate with point tenderness. 2. Multilevel degenerative changes with disc space loss at nearly every lumbar vertebral body level. Severe left-sided neural foraminal stenosis at L4-L5, L5-S1, and S1-S2. Severe right-sided neural foraminal stenosis at L3-L4, L4-L5, L5-S1, and S1-S2. 3. No evidence of high-grade spinal canal stenosis. Aortic Atherosclerosis (ICD10-I70.0). Electronically Signed   By: Marin Roberts M.D.   On: 06/24/2022 12:22   CT Head Wo Contrast  Result Date: 06/24/2022 CLINICAL DATA:  Multiple falls.  Evaluate for bleed. EXAM: CT HEAD WITHOUT CONTRAST TECHNIQUE: Contiguous axial images were obtained from the base of the skull through the vertex without intravenous contrast. RADIATION DOSE REDUCTION: This exam was performed according to the departmental dose-optimization program which includes automated exposure control, adjustment of the mA and/or kV according to patient size and/or use of iterative reconstruction technique. COMPARISON:  Brain CT 07/29/2018 FINDINGS: Brain: Ventricles and sulci are prominent compatible with atrophy. Chronic microvascular ischemic changes. No evidence for acute cortically based infarct, intracranial hemorrhage, mass lesion or mass-effect. Vascular: No hyperdense vessel or unexpected calcification. Skull: Normal. Negative for fracture or focal lesion. Sinuses/Orbits: Paranasal sinuses are well aerated. Mastoid air cells are unremarkable. Other: None IMPRESSION: 1. No acute intracranial process. 2. Atrophy and chronic microvascular ischemic changes. Electronically Signed    By: Lovey Newcomer M.D.   On: 06/24/2022 08:28   DG Pelvis 1-2 Views  Result Date: 06/24/2022 CLINICAL DATA:  Multiple falls.  Hip EXAM: PELVIS - 1-2 VIEW COMPARISON:  None Available. FINDINGS: Single view radiograph is available dictation. There is no evidence of pelvic fracture or diastasis. No pelvic bone lesions are seen. Severe disc space loss in the lower lumbar spine. Mild degenerative changes of the bilateral hip joints. Trochanteric enthesopathic changes. Vascular calcifications. Pelvic phleboliths. IMPRESSION: No acute fracture or dislocation on this single view pelvis radiograph. Electronically Signed   By: Marin Roberts M.D.   On: 06/24/2022 07:56   DG Chest 2 View  Result Date: 06/24/2022 CLINICAL DATA:  Fall. EXAM: CHEST - 2 VIEW COMPARISON:  Chest radiograph 07/29/2018 FINDINGS: Stable cardiac and mediastinal contours. Low lung volumes. Patchy heterogeneous opacities within the lung bases bilaterally. No pleural effusion or pneumothorax. Thoracic spine degenerative changes. IMPRESSION: Low lung volumes with basilar atelectasis. Electronically Signed   By: Lovey Newcomer M.D.   On: 06/24/2022 07:54    Labs: BNP (last 3 results) Recent Labs    06/24/22 0534  BNP 154.0*   Basic Metabolic Panel: Recent Labs  Lab 07/01/22 0745 07/02/22  1107 07/03/22 0729  NA 138 137 136  K 4.1 4.0 4.4  CL 104 101 98  CO2 28 29 32  GLUCOSE 162* 261* 121*  BUN 15 24* 26*  CREATININE 0.49* 0.77 0.73  CALCIUM 9.0 9.0 8.8*  MG 2.0 2.0 2.2   Liver Function Tests: No results for input(s): "AST", "ALT", "ALKPHOS", "BILITOT", "PROT", "ALBUMIN" in the last 168 hours. No results for input(s): "LIPASE", "AMYLASE" in the last 168 hours. No results for input(s): "AMMONIA" in the last 168 hours. CBC: Recent Labs  Lab 07/02/22 1107  WBC 6.6  HGB 15.5  HCT 47.1  MCV 91.6  PLT 194   Cardiac Enzymes: Recent Labs  Lab 07/01/22 0745 07/02/22 1107 07/03/22 0729  CKTOTAL 58 40* 42*    BNP: Invalid input(s): "POCBNP" CBG: Recent Labs  Lab 07/06/22 0801 07/06/22 1126 07/06/22 1702 07/06/22 2036 07/07/22 0750  GLUCAP 145* 210* 185* 235* 100*   D-Dimer No results for input(s): "DDIMER" in the last 72 hours. Hgb A1c No results for input(s): "HGBA1C" in the last 72 hours. Lipid Profile No results for input(s): "CHOL", "HDL", "LDLCALC", "TRIG", "CHOLHDL", "LDLDIRECT" in the last 72 hours. Thyroid function studies No results for input(s): "TSH", "T4TOTAL", "T3FREE", "THYROIDAB" in the last 72 hours.  Invalid input(s): "FREET3" Anemia work up No results for input(s): "VITAMINB12", "FOLATE", "FERRITIN", "TIBC", "IRON", "RETICCTPCT" in the last 72 hours. Urinalysis    Component Value Date/Time   COLORURINE YELLOW 06/24/2022 1943   APPEARANCEUR CLEAR 06/24/2022 1943   LABSPEC 1.030 06/24/2022 1943   PHURINE 5.0 06/24/2022 1943   GLUCOSEU >=500 (A) 06/24/2022 1943   HGBUR NEGATIVE 06/24/2022 1943   BILIRUBINUR NEGATIVE 06/24/2022 1943   KETONESUR NEGATIVE 06/24/2022 1943   PROTEINUR NEGATIVE 06/24/2022 1943   NITRITE NEGATIVE 06/24/2022 1943   LEUKOCYTESUR NEGATIVE 06/24/2022 1943   Sepsis Labs Recent Labs  Lab 07/02/22 1107  WBC 6.6   Microbiology No results found for this or any previous visit (from the past 240 hour(s)).   Time coordinating discharge: 25 minutes  SIGNED: Antonieta Pert, MD  [facility] 07/07/2022, 10:46 AM  If 7PM-7AM, please contact night-coverage [email or web address]

## 2022-07-13 DIAGNOSIS — R2243 Localized swelling, mass and lump, lower limb, bilateral: Secondary | ICD-10-CM | POA: Diagnosis not present

## 2022-07-15 DIAGNOSIS — E785 Hyperlipidemia, unspecified: Secondary | ICD-10-CM | POA: Diagnosis not present

## 2022-07-15 DIAGNOSIS — H409 Unspecified glaucoma: Secondary | ICD-10-CM | POA: Diagnosis not present

## 2022-07-15 DIAGNOSIS — G459 Transient cerebral ischemic attack, unspecified: Secondary | ICD-10-CM | POA: Diagnosis not present

## 2022-07-15 DIAGNOSIS — E119 Type 2 diabetes mellitus without complications: Secondary | ICD-10-CM | POA: Diagnosis not present

## 2022-07-21 DIAGNOSIS — M6281 Muscle weakness (generalized): Secondary | ICD-10-CM | POA: Diagnosis not present

## 2022-07-21 DIAGNOSIS — Z9181 History of falling: Secondary | ICD-10-CM | POA: Diagnosis not present

## 2022-07-21 DIAGNOSIS — R2689 Other abnormalities of gait and mobility: Secondary | ICD-10-CM | POA: Diagnosis not present

## 2022-07-23 DIAGNOSIS — M4856XD Collapsed vertebra, not elsewhere classified, lumbar region, subsequent encounter for fracture with routine healing: Secondary | ICD-10-CM | POA: Diagnosis not present

## 2022-07-23 DIAGNOSIS — I10 Essential (primary) hypertension: Secondary | ICD-10-CM | POA: Diagnosis not present

## 2022-07-23 DIAGNOSIS — Z7982 Long term (current) use of aspirin: Secondary | ICD-10-CM | POA: Diagnosis not present

## 2022-07-23 DIAGNOSIS — I700 Atherosclerosis of aorta: Secondary | ICD-10-CM | POA: Diagnosis not present

## 2022-07-23 DIAGNOSIS — M9964 Osseous and subluxation stenosis of intervertebral foramina of sacral region: Secondary | ICD-10-CM | POA: Diagnosis not present

## 2022-07-23 DIAGNOSIS — E785 Hyperlipidemia, unspecified: Secondary | ICD-10-CM | POA: Diagnosis not present

## 2022-07-23 DIAGNOSIS — J9811 Atelectasis: Secondary | ICD-10-CM | POA: Diagnosis not present

## 2022-07-23 DIAGNOSIS — M47816 Spondylosis without myelopathy or radiculopathy, lumbar region: Secondary | ICD-10-CM | POA: Diagnosis not present

## 2022-07-23 DIAGNOSIS — Z794 Long term (current) use of insulin: Secondary | ICD-10-CM | POA: Diagnosis not present

## 2022-07-23 DIAGNOSIS — M9963 Osseous and subluxation stenosis of intervertebral foramina of lumbar region: Secondary | ICD-10-CM | POA: Diagnosis not present

## 2022-07-23 DIAGNOSIS — Z7902 Long term (current) use of antithrombotics/antiplatelets: Secondary | ICD-10-CM | POA: Diagnosis not present

## 2022-07-23 DIAGNOSIS — M4854XD Collapsed vertebra, not elsewhere classified, thoracic region, subsequent encounter for fracture with routine healing: Secondary | ICD-10-CM | POA: Diagnosis not present

## 2022-07-23 DIAGNOSIS — W19XXXD Unspecified fall, subsequent encounter: Secondary | ICD-10-CM | POA: Diagnosis not present

## 2022-07-23 DIAGNOSIS — H409 Unspecified glaucoma: Secondary | ICD-10-CM | POA: Diagnosis not present

## 2022-07-23 DIAGNOSIS — E1165 Type 2 diabetes mellitus with hyperglycemia: Secondary | ICD-10-CM | POA: Diagnosis not present

## 2022-07-23 DIAGNOSIS — Z9181 History of falling: Secondary | ICD-10-CM | POA: Diagnosis not present

## 2022-07-23 DIAGNOSIS — Z7984 Long term (current) use of oral hypoglycemic drugs: Secondary | ICD-10-CM | POA: Diagnosis not present

## 2022-07-26 DIAGNOSIS — I10 Essential (primary) hypertension: Secondary | ICD-10-CM | POA: Diagnosis not present

## 2022-07-26 DIAGNOSIS — Z794 Long term (current) use of insulin: Secondary | ICD-10-CM | POA: Diagnosis not present

## 2022-07-26 DIAGNOSIS — E785 Hyperlipidemia, unspecified: Secondary | ICD-10-CM | POA: Diagnosis not present

## 2022-07-26 DIAGNOSIS — M9963 Osseous and subluxation stenosis of intervertebral foramina of lumbar region: Secondary | ICD-10-CM | POA: Diagnosis not present

## 2022-07-26 DIAGNOSIS — Z7982 Long term (current) use of aspirin: Secondary | ICD-10-CM | POA: Diagnosis not present

## 2022-07-26 DIAGNOSIS — Z7902 Long term (current) use of antithrombotics/antiplatelets: Secondary | ICD-10-CM | POA: Diagnosis not present

## 2022-07-26 DIAGNOSIS — E1165 Type 2 diabetes mellitus with hyperglycemia: Secondary | ICD-10-CM | POA: Diagnosis not present

## 2022-07-26 DIAGNOSIS — M4856XD Collapsed vertebra, not elsewhere classified, lumbar region, subsequent encounter for fracture with routine healing: Secondary | ICD-10-CM | POA: Diagnosis not present

## 2022-07-26 DIAGNOSIS — M4854XD Collapsed vertebra, not elsewhere classified, thoracic region, subsequent encounter for fracture with routine healing: Secondary | ICD-10-CM | POA: Diagnosis not present

## 2022-07-26 DIAGNOSIS — J9811 Atelectasis: Secondary | ICD-10-CM | POA: Diagnosis not present

## 2022-07-26 DIAGNOSIS — M9964 Osseous and subluxation stenosis of intervertebral foramina of sacral region: Secondary | ICD-10-CM | POA: Diagnosis not present

## 2022-07-26 DIAGNOSIS — W19XXXD Unspecified fall, subsequent encounter: Secondary | ICD-10-CM | POA: Diagnosis not present

## 2022-07-26 DIAGNOSIS — I700 Atherosclerosis of aorta: Secondary | ICD-10-CM | POA: Diagnosis not present

## 2022-07-26 DIAGNOSIS — H409 Unspecified glaucoma: Secondary | ICD-10-CM | POA: Diagnosis not present

## 2022-07-26 DIAGNOSIS — M47816 Spondylosis without myelopathy or radiculopathy, lumbar region: Secondary | ICD-10-CM | POA: Diagnosis not present

## 2022-07-26 DIAGNOSIS — Z7984 Long term (current) use of oral hypoglycemic drugs: Secondary | ICD-10-CM | POA: Diagnosis not present

## 2022-07-27 DIAGNOSIS — H409 Unspecified glaucoma: Secondary | ICD-10-CM | POA: Diagnosis not present

## 2022-07-27 DIAGNOSIS — M9964 Osseous and subluxation stenosis of intervertebral foramina of sacral region: Secondary | ICD-10-CM | POA: Diagnosis not present

## 2022-07-27 DIAGNOSIS — M4854XD Collapsed vertebra, not elsewhere classified, thoracic region, subsequent encounter for fracture with routine healing: Secondary | ICD-10-CM | POA: Diagnosis not present

## 2022-07-27 DIAGNOSIS — M9963 Osseous and subluxation stenosis of intervertebral foramina of lumbar region: Secondary | ICD-10-CM | POA: Diagnosis not present

## 2022-07-27 DIAGNOSIS — Z7984 Long term (current) use of oral hypoglycemic drugs: Secondary | ICD-10-CM | POA: Diagnosis not present

## 2022-07-27 DIAGNOSIS — E785 Hyperlipidemia, unspecified: Secondary | ICD-10-CM | POA: Diagnosis not present

## 2022-07-27 DIAGNOSIS — I700 Atherosclerosis of aorta: Secondary | ICD-10-CM | POA: Diagnosis not present

## 2022-07-27 DIAGNOSIS — E1165 Type 2 diabetes mellitus with hyperglycemia: Secondary | ICD-10-CM | POA: Diagnosis not present

## 2022-07-27 DIAGNOSIS — Z794 Long term (current) use of insulin: Secondary | ICD-10-CM | POA: Diagnosis not present

## 2022-07-27 DIAGNOSIS — Z7902 Long term (current) use of antithrombotics/antiplatelets: Secondary | ICD-10-CM | POA: Diagnosis not present

## 2022-07-27 DIAGNOSIS — M4856XD Collapsed vertebra, not elsewhere classified, lumbar region, subsequent encounter for fracture with routine healing: Secondary | ICD-10-CM | POA: Diagnosis not present

## 2022-07-27 DIAGNOSIS — I10 Essential (primary) hypertension: Secondary | ICD-10-CM | POA: Diagnosis not present

## 2022-07-27 DIAGNOSIS — Z7982 Long term (current) use of aspirin: Secondary | ICD-10-CM | POA: Diagnosis not present

## 2022-07-27 DIAGNOSIS — J9811 Atelectasis: Secondary | ICD-10-CM | POA: Diagnosis not present

## 2022-07-27 DIAGNOSIS — M47816 Spondylosis without myelopathy or radiculopathy, lumbar region: Secondary | ICD-10-CM | POA: Diagnosis not present

## 2022-07-27 DIAGNOSIS — W19XXXD Unspecified fall, subsequent encounter: Secondary | ICD-10-CM | POA: Diagnosis not present

## 2022-07-29 DIAGNOSIS — Z7982 Long term (current) use of aspirin: Secondary | ICD-10-CM | POA: Diagnosis not present

## 2022-07-29 DIAGNOSIS — E1165 Type 2 diabetes mellitus with hyperglycemia: Secondary | ICD-10-CM | POA: Diagnosis not present

## 2022-07-29 DIAGNOSIS — Z7902 Long term (current) use of antithrombotics/antiplatelets: Secondary | ICD-10-CM | POA: Diagnosis not present

## 2022-07-29 DIAGNOSIS — Z7984 Long term (current) use of oral hypoglycemic drugs: Secondary | ICD-10-CM | POA: Diagnosis not present

## 2022-07-29 DIAGNOSIS — W19XXXD Unspecified fall, subsequent encounter: Secondary | ICD-10-CM | POA: Diagnosis not present

## 2022-07-29 DIAGNOSIS — M4856XD Collapsed vertebra, not elsewhere classified, lumbar region, subsequent encounter for fracture with routine healing: Secondary | ICD-10-CM | POA: Diagnosis not present

## 2022-07-29 DIAGNOSIS — M4854XD Collapsed vertebra, not elsewhere classified, thoracic region, subsequent encounter for fracture with routine healing: Secondary | ICD-10-CM | POA: Diagnosis not present

## 2022-07-29 DIAGNOSIS — H409 Unspecified glaucoma: Secondary | ICD-10-CM | POA: Diagnosis not present

## 2022-07-29 DIAGNOSIS — M9964 Osseous and subluxation stenosis of intervertebral foramina of sacral region: Secondary | ICD-10-CM | POA: Diagnosis not present

## 2022-07-29 DIAGNOSIS — E785 Hyperlipidemia, unspecified: Secondary | ICD-10-CM | POA: Diagnosis not present

## 2022-07-29 DIAGNOSIS — I10 Essential (primary) hypertension: Secondary | ICD-10-CM | POA: Diagnosis not present

## 2022-07-29 DIAGNOSIS — Z794 Long term (current) use of insulin: Secondary | ICD-10-CM | POA: Diagnosis not present

## 2022-07-29 DIAGNOSIS — M9963 Osseous and subluxation stenosis of intervertebral foramina of lumbar region: Secondary | ICD-10-CM | POA: Diagnosis not present

## 2022-07-29 DIAGNOSIS — I700 Atherosclerosis of aorta: Secondary | ICD-10-CM | POA: Diagnosis not present

## 2022-07-29 DIAGNOSIS — J9811 Atelectasis: Secondary | ICD-10-CM | POA: Diagnosis not present

## 2022-07-29 DIAGNOSIS — M47816 Spondylosis without myelopathy or radiculopathy, lumbar region: Secondary | ICD-10-CM | POA: Diagnosis not present

## 2022-08-03 DIAGNOSIS — Z7982 Long term (current) use of aspirin: Secondary | ICD-10-CM | POA: Diagnosis not present

## 2022-08-03 DIAGNOSIS — M47816 Spondylosis without myelopathy or radiculopathy, lumbar region: Secondary | ICD-10-CM | POA: Diagnosis not present

## 2022-08-03 DIAGNOSIS — M9963 Osseous and subluxation stenosis of intervertebral foramina of lumbar region: Secondary | ICD-10-CM | POA: Diagnosis not present

## 2022-08-03 DIAGNOSIS — M4854XD Collapsed vertebra, not elsewhere classified, thoracic region, subsequent encounter for fracture with routine healing: Secondary | ICD-10-CM | POA: Diagnosis not present

## 2022-08-03 DIAGNOSIS — W19XXXD Unspecified fall, subsequent encounter: Secondary | ICD-10-CM | POA: Diagnosis not present

## 2022-08-03 DIAGNOSIS — M9964 Osseous and subluxation stenosis of intervertebral foramina of sacral region: Secondary | ICD-10-CM | POA: Diagnosis not present

## 2022-08-03 DIAGNOSIS — J9811 Atelectasis: Secondary | ICD-10-CM | POA: Diagnosis not present

## 2022-08-03 DIAGNOSIS — Z7984 Long term (current) use of oral hypoglycemic drugs: Secondary | ICD-10-CM | POA: Diagnosis not present

## 2022-08-03 DIAGNOSIS — H409 Unspecified glaucoma: Secondary | ICD-10-CM | POA: Diagnosis not present

## 2022-08-03 DIAGNOSIS — I10 Essential (primary) hypertension: Secondary | ICD-10-CM | POA: Diagnosis not present

## 2022-08-03 DIAGNOSIS — M4856XD Collapsed vertebra, not elsewhere classified, lumbar region, subsequent encounter for fracture with routine healing: Secondary | ICD-10-CM | POA: Diagnosis not present

## 2022-08-03 DIAGNOSIS — Z7902 Long term (current) use of antithrombotics/antiplatelets: Secondary | ICD-10-CM | POA: Diagnosis not present

## 2022-08-03 DIAGNOSIS — E785 Hyperlipidemia, unspecified: Secondary | ICD-10-CM | POA: Diagnosis not present

## 2022-08-03 DIAGNOSIS — E1165 Type 2 diabetes mellitus with hyperglycemia: Secondary | ICD-10-CM | POA: Diagnosis not present

## 2022-08-03 DIAGNOSIS — Z794 Long term (current) use of insulin: Secondary | ICD-10-CM | POA: Diagnosis not present

## 2022-08-03 DIAGNOSIS — I700 Atherosclerosis of aorta: Secondary | ICD-10-CM | POA: Diagnosis not present

## 2022-08-04 DIAGNOSIS — I10 Essential (primary) hypertension: Secondary | ICD-10-CM | POA: Diagnosis not present

## 2022-08-04 DIAGNOSIS — E785 Hyperlipidemia, unspecified: Secondary | ICD-10-CM | POA: Diagnosis not present

## 2022-08-05 DIAGNOSIS — E1165 Type 2 diabetes mellitus with hyperglycemia: Secondary | ICD-10-CM | POA: Diagnosis not present

## 2022-08-05 DIAGNOSIS — Z7982 Long term (current) use of aspirin: Secondary | ICD-10-CM | POA: Diagnosis not present

## 2022-08-05 DIAGNOSIS — Z7984 Long term (current) use of oral hypoglycemic drugs: Secondary | ICD-10-CM | POA: Diagnosis not present

## 2022-08-05 DIAGNOSIS — I10 Essential (primary) hypertension: Secondary | ICD-10-CM | POA: Diagnosis not present

## 2022-08-05 DIAGNOSIS — J9811 Atelectasis: Secondary | ICD-10-CM | POA: Diagnosis not present

## 2022-08-05 DIAGNOSIS — E785 Hyperlipidemia, unspecified: Secondary | ICD-10-CM | POA: Diagnosis not present

## 2022-08-05 DIAGNOSIS — I700 Atherosclerosis of aorta: Secondary | ICD-10-CM | POA: Diagnosis not present

## 2022-08-05 DIAGNOSIS — M9964 Osseous and subluxation stenosis of intervertebral foramina of sacral region: Secondary | ICD-10-CM | POA: Diagnosis not present

## 2022-08-05 DIAGNOSIS — M4854XD Collapsed vertebra, not elsewhere classified, thoracic region, subsequent encounter for fracture with routine healing: Secondary | ICD-10-CM | POA: Diagnosis not present

## 2022-08-05 DIAGNOSIS — W19XXXD Unspecified fall, subsequent encounter: Secondary | ICD-10-CM | POA: Diagnosis not present

## 2022-08-05 DIAGNOSIS — H409 Unspecified glaucoma: Secondary | ICD-10-CM | POA: Diagnosis not present

## 2022-08-05 DIAGNOSIS — Z794 Long term (current) use of insulin: Secondary | ICD-10-CM | POA: Diagnosis not present

## 2022-08-05 DIAGNOSIS — M47816 Spondylosis without myelopathy or radiculopathy, lumbar region: Secondary | ICD-10-CM | POA: Diagnosis not present

## 2022-08-05 DIAGNOSIS — Z7902 Long term (current) use of antithrombotics/antiplatelets: Secondary | ICD-10-CM | POA: Diagnosis not present

## 2022-08-05 DIAGNOSIS — M4856XD Collapsed vertebra, not elsewhere classified, lumbar region, subsequent encounter for fracture with routine healing: Secondary | ICD-10-CM | POA: Diagnosis not present

## 2022-08-05 DIAGNOSIS — M9963 Osseous and subluxation stenosis of intervertebral foramina of lumbar region: Secondary | ICD-10-CM | POA: Diagnosis not present

## 2022-08-09 DIAGNOSIS — E1165 Type 2 diabetes mellitus with hyperglycemia: Secondary | ICD-10-CM | POA: Diagnosis not present

## 2022-08-09 DIAGNOSIS — M4856XD Collapsed vertebra, not elsewhere classified, lumbar region, subsequent encounter for fracture with routine healing: Secondary | ICD-10-CM | POA: Diagnosis not present

## 2022-08-09 DIAGNOSIS — W19XXXD Unspecified fall, subsequent encounter: Secondary | ICD-10-CM | POA: Diagnosis not present

## 2022-08-09 DIAGNOSIS — Z7902 Long term (current) use of antithrombotics/antiplatelets: Secondary | ICD-10-CM | POA: Diagnosis not present

## 2022-08-09 DIAGNOSIS — M9964 Osseous and subluxation stenosis of intervertebral foramina of sacral region: Secondary | ICD-10-CM | POA: Diagnosis not present

## 2022-08-09 DIAGNOSIS — Z794 Long term (current) use of insulin: Secondary | ICD-10-CM | POA: Diagnosis not present

## 2022-08-09 DIAGNOSIS — H409 Unspecified glaucoma: Secondary | ICD-10-CM | POA: Diagnosis not present

## 2022-08-09 DIAGNOSIS — E785 Hyperlipidemia, unspecified: Secondary | ICD-10-CM | POA: Diagnosis not present

## 2022-08-09 DIAGNOSIS — I10 Essential (primary) hypertension: Secondary | ICD-10-CM | POA: Diagnosis not present

## 2022-08-09 DIAGNOSIS — I700 Atherosclerosis of aorta: Secondary | ICD-10-CM | POA: Diagnosis not present

## 2022-08-09 DIAGNOSIS — M4854XD Collapsed vertebra, not elsewhere classified, thoracic region, subsequent encounter for fracture with routine healing: Secondary | ICD-10-CM | POA: Diagnosis not present

## 2022-08-09 DIAGNOSIS — M47816 Spondylosis without myelopathy or radiculopathy, lumbar region: Secondary | ICD-10-CM | POA: Diagnosis not present

## 2022-08-09 DIAGNOSIS — Z7984 Long term (current) use of oral hypoglycemic drugs: Secondary | ICD-10-CM | POA: Diagnosis not present

## 2022-08-09 DIAGNOSIS — Z7982 Long term (current) use of aspirin: Secondary | ICD-10-CM | POA: Diagnosis not present

## 2022-08-09 DIAGNOSIS — M9963 Osseous and subluxation stenosis of intervertebral foramina of lumbar region: Secondary | ICD-10-CM | POA: Diagnosis not present

## 2022-08-09 DIAGNOSIS — J9811 Atelectasis: Secondary | ICD-10-CM | POA: Diagnosis not present

## 2022-08-11 DIAGNOSIS — W19XXXD Unspecified fall, subsequent encounter: Secondary | ICD-10-CM | POA: Diagnosis not present

## 2022-08-11 DIAGNOSIS — Z7982 Long term (current) use of aspirin: Secondary | ICD-10-CM | POA: Diagnosis not present

## 2022-08-11 DIAGNOSIS — Z7902 Long term (current) use of antithrombotics/antiplatelets: Secondary | ICD-10-CM | POA: Diagnosis not present

## 2022-08-11 DIAGNOSIS — Z7984 Long term (current) use of oral hypoglycemic drugs: Secondary | ICD-10-CM | POA: Diagnosis not present

## 2022-08-11 DIAGNOSIS — J9811 Atelectasis: Secondary | ICD-10-CM | POA: Diagnosis not present

## 2022-08-11 DIAGNOSIS — M9963 Osseous and subluxation stenosis of intervertebral foramina of lumbar region: Secondary | ICD-10-CM | POA: Diagnosis not present

## 2022-08-11 DIAGNOSIS — E785 Hyperlipidemia, unspecified: Secondary | ICD-10-CM | POA: Diagnosis not present

## 2022-08-11 DIAGNOSIS — M47816 Spondylosis without myelopathy or radiculopathy, lumbar region: Secondary | ICD-10-CM | POA: Diagnosis not present

## 2022-08-11 DIAGNOSIS — M9964 Osseous and subluxation stenosis of intervertebral foramina of sacral region: Secondary | ICD-10-CM | POA: Diagnosis not present

## 2022-08-11 DIAGNOSIS — M4854XD Collapsed vertebra, not elsewhere classified, thoracic region, subsequent encounter for fracture with routine healing: Secondary | ICD-10-CM | POA: Diagnosis not present

## 2022-08-11 DIAGNOSIS — I10 Essential (primary) hypertension: Secondary | ICD-10-CM | POA: Diagnosis not present

## 2022-08-11 DIAGNOSIS — H409 Unspecified glaucoma: Secondary | ICD-10-CM | POA: Diagnosis not present

## 2022-08-11 DIAGNOSIS — Z794 Long term (current) use of insulin: Secondary | ICD-10-CM | POA: Diagnosis not present

## 2022-08-11 DIAGNOSIS — E1165 Type 2 diabetes mellitus with hyperglycemia: Secondary | ICD-10-CM | POA: Diagnosis not present

## 2022-08-11 DIAGNOSIS — I700 Atherosclerosis of aorta: Secondary | ICD-10-CM | POA: Diagnosis not present

## 2022-08-11 DIAGNOSIS — M4856XD Collapsed vertebra, not elsewhere classified, lumbar region, subsequent encounter for fracture with routine healing: Secondary | ICD-10-CM | POA: Diagnosis not present

## 2022-08-19 DIAGNOSIS — J9811 Atelectasis: Secondary | ICD-10-CM | POA: Diagnosis not present

## 2022-08-19 DIAGNOSIS — Z7902 Long term (current) use of antithrombotics/antiplatelets: Secondary | ICD-10-CM | POA: Diagnosis not present

## 2022-08-19 DIAGNOSIS — I10 Essential (primary) hypertension: Secondary | ICD-10-CM | POA: Diagnosis not present

## 2022-08-19 DIAGNOSIS — E785 Hyperlipidemia, unspecified: Secondary | ICD-10-CM | POA: Diagnosis not present

## 2022-08-19 DIAGNOSIS — I700 Atherosclerosis of aorta: Secondary | ICD-10-CM | POA: Diagnosis not present

## 2022-08-19 DIAGNOSIS — M9964 Osseous and subluxation stenosis of intervertebral foramina of sacral region: Secondary | ICD-10-CM | POA: Diagnosis not present

## 2022-08-19 DIAGNOSIS — M47816 Spondylosis without myelopathy or radiculopathy, lumbar region: Secondary | ICD-10-CM | POA: Diagnosis not present

## 2022-08-19 DIAGNOSIS — E1165 Type 2 diabetes mellitus with hyperglycemia: Secondary | ICD-10-CM | POA: Diagnosis not present

## 2022-08-19 DIAGNOSIS — Z7984 Long term (current) use of oral hypoglycemic drugs: Secondary | ICD-10-CM | POA: Diagnosis not present

## 2022-08-19 DIAGNOSIS — H409 Unspecified glaucoma: Secondary | ICD-10-CM | POA: Diagnosis not present

## 2022-08-19 DIAGNOSIS — Z794 Long term (current) use of insulin: Secondary | ICD-10-CM | POA: Diagnosis not present

## 2022-08-19 DIAGNOSIS — M9963 Osseous and subluxation stenosis of intervertebral foramina of lumbar region: Secondary | ICD-10-CM | POA: Diagnosis not present

## 2022-08-19 DIAGNOSIS — M4856XD Collapsed vertebra, not elsewhere classified, lumbar region, subsequent encounter for fracture with routine healing: Secondary | ICD-10-CM | POA: Diagnosis not present

## 2022-08-19 DIAGNOSIS — M4854XD Collapsed vertebra, not elsewhere classified, thoracic region, subsequent encounter for fracture with routine healing: Secondary | ICD-10-CM | POA: Diagnosis not present

## 2022-08-19 DIAGNOSIS — Z7982 Long term (current) use of aspirin: Secondary | ICD-10-CM | POA: Diagnosis not present

## 2022-08-19 DIAGNOSIS — W19XXXD Unspecified fall, subsequent encounter: Secondary | ICD-10-CM | POA: Diagnosis not present

## 2022-08-25 DIAGNOSIS — I10 Essential (primary) hypertension: Secondary | ICD-10-CM | POA: Diagnosis not present

## 2022-08-25 DIAGNOSIS — I700 Atherosclerosis of aorta: Secondary | ICD-10-CM | POA: Diagnosis not present

## 2022-08-25 DIAGNOSIS — E1165 Type 2 diabetes mellitus with hyperglycemia: Secondary | ICD-10-CM | POA: Diagnosis not present

## 2022-08-25 DIAGNOSIS — Z9181 History of falling: Secondary | ICD-10-CM | POA: Diagnosis not present

## 2022-08-25 DIAGNOSIS — E785 Hyperlipidemia, unspecified: Secondary | ICD-10-CM | POA: Diagnosis not present

## 2022-08-25 DIAGNOSIS — Z7984 Long term (current) use of oral hypoglycemic drugs: Secondary | ICD-10-CM | POA: Diagnosis not present

## 2022-08-25 DIAGNOSIS — M9964 Osseous and subluxation stenosis of intervertebral foramina of sacral region: Secondary | ICD-10-CM | POA: Diagnosis not present

## 2022-08-25 DIAGNOSIS — Z7902 Long term (current) use of antithrombotics/antiplatelets: Secondary | ICD-10-CM | POA: Diagnosis not present

## 2022-08-25 DIAGNOSIS — J9811 Atelectasis: Secondary | ICD-10-CM | POA: Diagnosis not present

## 2022-08-25 DIAGNOSIS — M47816 Spondylosis without myelopathy or radiculopathy, lumbar region: Secondary | ICD-10-CM | POA: Diagnosis not present

## 2022-08-25 DIAGNOSIS — M9963 Osseous and subluxation stenosis of intervertebral foramina of lumbar region: Secondary | ICD-10-CM | POA: Diagnosis not present

## 2022-08-25 DIAGNOSIS — M4856XD Collapsed vertebra, not elsewhere classified, lumbar region, subsequent encounter for fracture with routine healing: Secondary | ICD-10-CM | POA: Diagnosis not present

## 2022-08-25 DIAGNOSIS — Z794 Long term (current) use of insulin: Secondary | ICD-10-CM | POA: Diagnosis not present

## 2022-08-25 DIAGNOSIS — H409 Unspecified glaucoma: Secondary | ICD-10-CM | POA: Diagnosis not present

## 2022-08-25 DIAGNOSIS — M4854XD Collapsed vertebra, not elsewhere classified, thoracic region, subsequent encounter for fracture with routine healing: Secondary | ICD-10-CM | POA: Diagnosis not present

## 2022-08-25 DIAGNOSIS — Z7982 Long term (current) use of aspirin: Secondary | ICD-10-CM | POA: Diagnosis not present

## 2022-08-25 DIAGNOSIS — W19XXXD Unspecified fall, subsequent encounter: Secondary | ICD-10-CM | POA: Diagnosis not present

## 2022-09-08 DIAGNOSIS — I10 Essential (primary) hypertension: Secondary | ICD-10-CM | POA: Diagnosis not present

## 2022-09-08 DIAGNOSIS — Z7982 Long term (current) use of aspirin: Secondary | ICD-10-CM | POA: Diagnosis not present

## 2022-09-08 DIAGNOSIS — E1165 Type 2 diabetes mellitus with hyperglycemia: Secondary | ICD-10-CM | POA: Diagnosis not present

## 2022-09-08 DIAGNOSIS — E785 Hyperlipidemia, unspecified: Secondary | ICD-10-CM | POA: Diagnosis not present

## 2022-09-08 DIAGNOSIS — M9964 Osseous and subluxation stenosis of intervertebral foramina of sacral region: Secondary | ICD-10-CM | POA: Diagnosis not present

## 2022-09-08 DIAGNOSIS — M9963 Osseous and subluxation stenosis of intervertebral foramina of lumbar region: Secondary | ICD-10-CM | POA: Diagnosis not present

## 2022-09-08 DIAGNOSIS — M47816 Spondylosis without myelopathy or radiculopathy, lumbar region: Secondary | ICD-10-CM | POA: Diagnosis not present

## 2022-09-08 DIAGNOSIS — I700 Atherosclerosis of aorta: Secondary | ICD-10-CM | POA: Diagnosis not present

## 2022-09-08 DIAGNOSIS — M4854XD Collapsed vertebra, not elsewhere classified, thoracic region, subsequent encounter for fracture with routine healing: Secondary | ICD-10-CM | POA: Diagnosis not present

## 2022-09-08 DIAGNOSIS — M4856XD Collapsed vertebra, not elsewhere classified, lumbar region, subsequent encounter for fracture with routine healing: Secondary | ICD-10-CM | POA: Diagnosis not present

## 2022-09-08 DIAGNOSIS — W19XXXD Unspecified fall, subsequent encounter: Secondary | ICD-10-CM | POA: Diagnosis not present

## 2022-09-08 DIAGNOSIS — J9811 Atelectasis: Secondary | ICD-10-CM | POA: Diagnosis not present

## 2022-09-08 DIAGNOSIS — H409 Unspecified glaucoma: Secondary | ICD-10-CM | POA: Diagnosis not present

## 2022-09-08 DIAGNOSIS — Z794 Long term (current) use of insulin: Secondary | ICD-10-CM | POA: Diagnosis not present

## 2022-09-08 DIAGNOSIS — Z7984 Long term (current) use of oral hypoglycemic drugs: Secondary | ICD-10-CM | POA: Diagnosis not present

## 2022-09-08 DIAGNOSIS — Z7902 Long term (current) use of antithrombotics/antiplatelets: Secondary | ICD-10-CM | POA: Diagnosis not present

## 2022-11-15 DIAGNOSIS — S61309A Unspecified open wound of unspecified finger with damage to nail, initial encounter: Secondary | ICD-10-CM | POA: Diagnosis not present

## 2022-12-14 DIAGNOSIS — Z961 Presence of intraocular lens: Secondary | ICD-10-CM | POA: Diagnosis not present

## 2022-12-14 DIAGNOSIS — H401131 Primary open-angle glaucoma, bilateral, mild stage: Secondary | ICD-10-CM | POA: Diagnosis not present

## 2022-12-14 DIAGNOSIS — E113293 Type 2 diabetes mellitus with mild nonproliferative diabetic retinopathy without macular edema, bilateral: Secondary | ICD-10-CM | POA: Diagnosis not present

## 2023-02-06 DIAGNOSIS — S42201A Unspecified fracture of upper end of right humerus, initial encounter for closed fracture: Secondary | ICD-10-CM | POA: Diagnosis not present

## 2023-02-06 DIAGNOSIS — M1712 Unilateral primary osteoarthritis, left knee: Secondary | ICD-10-CM | POA: Diagnosis not present

## 2023-02-16 DIAGNOSIS — S42201A Unspecified fracture of upper end of right humerus, initial encounter for closed fracture: Secondary | ICD-10-CM | POA: Diagnosis not present

## 2023-02-16 DIAGNOSIS — M1712 Unilateral primary osteoarthritis, left knee: Secondary | ICD-10-CM | POA: Diagnosis not present

## 2023-02-17 DIAGNOSIS — M150 Primary generalized (osteo)arthritis: Secondary | ICD-10-CM | POA: Diagnosis not present

## 2023-02-17 DIAGNOSIS — R278 Other lack of coordination: Secondary | ICD-10-CM | POA: Diagnosis not present

## 2023-02-17 DIAGNOSIS — I739 Peripheral vascular disease, unspecified: Secondary | ICD-10-CM | POA: Diagnosis not present

## 2023-02-17 DIAGNOSIS — H44519 Absolute glaucoma, unspecified eye: Secondary | ICD-10-CM | POA: Diagnosis not present

## 2023-02-17 DIAGNOSIS — G4700 Insomnia, unspecified: Secondary | ICD-10-CM | POA: Diagnosis not present

## 2023-02-17 DIAGNOSIS — H409 Unspecified glaucoma: Secondary | ICD-10-CM | POA: Diagnosis not present

## 2023-02-17 DIAGNOSIS — E088 Diabetes mellitus due to underlying condition with unspecified complications: Secondary | ICD-10-CM | POA: Diagnosis not present

## 2023-02-17 DIAGNOSIS — I10 Essential (primary) hypertension: Secondary | ICD-10-CM | POA: Diagnosis not present

## 2023-02-17 DIAGNOSIS — E1165 Type 2 diabetes mellitus with hyperglycemia: Secondary | ICD-10-CM | POA: Diagnosis not present

## 2023-02-17 DIAGNOSIS — M6281 Muscle weakness (generalized): Secondary | ICD-10-CM | POA: Diagnosis not present

## 2023-02-17 DIAGNOSIS — L239 Allergic contact dermatitis, unspecified cause: Secondary | ICD-10-CM | POA: Diagnosis not present

## 2023-02-17 DIAGNOSIS — S42309A Unspecified fracture of shaft of humerus, unspecified arm, initial encounter for closed fracture: Secondary | ICD-10-CM | POA: Diagnosis not present

## 2023-02-17 DIAGNOSIS — S42301D Unspecified fracture of shaft of humerus, right arm, subsequent encounter for fracture with routine healing: Secondary | ICD-10-CM | POA: Diagnosis not present

## 2023-02-17 DIAGNOSIS — R41841 Cognitive communication deficit: Secondary | ICD-10-CM | POA: Diagnosis not present

## 2023-02-17 DIAGNOSIS — R262 Difficulty in walking, not elsewhere classified: Secondary | ICD-10-CM | POA: Diagnosis not present

## 2023-02-17 DIAGNOSIS — E785 Hyperlipidemia, unspecified: Secondary | ICD-10-CM | POA: Diagnosis not present

## 2023-02-17 DIAGNOSIS — E119 Type 2 diabetes mellitus without complications: Secondary | ICD-10-CM | POA: Diagnosis not present

## 2023-02-20 DIAGNOSIS — H409 Unspecified glaucoma: Secondary | ICD-10-CM | POA: Diagnosis not present

## 2023-02-20 DIAGNOSIS — G4700 Insomnia, unspecified: Secondary | ICD-10-CM | POA: Diagnosis not present

## 2023-02-20 DIAGNOSIS — E119 Type 2 diabetes mellitus without complications: Secondary | ICD-10-CM | POA: Diagnosis not present

## 2023-02-20 DIAGNOSIS — E785 Hyperlipidemia, unspecified: Secondary | ICD-10-CM | POA: Diagnosis not present

## 2023-02-21 DIAGNOSIS — S42301D Unspecified fracture of shaft of humerus, right arm, subsequent encounter for fracture with routine healing: Secondary | ICD-10-CM | POA: Diagnosis not present

## 2023-02-21 DIAGNOSIS — M150 Primary generalized (osteo)arthritis: Secondary | ICD-10-CM | POA: Diagnosis not present

## 2023-02-21 DIAGNOSIS — E119 Type 2 diabetes mellitus without complications: Secondary | ICD-10-CM | POA: Diagnosis not present

## 2023-02-21 DIAGNOSIS — I10 Essential (primary) hypertension: Secondary | ICD-10-CM | POA: Diagnosis not present

## 2023-02-24 DIAGNOSIS — E785 Hyperlipidemia, unspecified: Secondary | ICD-10-CM | POA: Diagnosis not present

## 2023-02-24 DIAGNOSIS — E1165 Type 2 diabetes mellitus with hyperglycemia: Secondary | ICD-10-CM | POA: Diagnosis not present

## 2023-02-24 DIAGNOSIS — I10 Essential (primary) hypertension: Secondary | ICD-10-CM | POA: Diagnosis not present

## 2023-02-24 DIAGNOSIS — S42309A Unspecified fracture of shaft of humerus, unspecified arm, initial encounter for closed fracture: Secondary | ICD-10-CM | POA: Diagnosis not present

## 2023-02-28 DIAGNOSIS — M150 Primary generalized (osteo)arthritis: Secondary | ICD-10-CM | POA: Diagnosis not present

## 2023-02-28 DIAGNOSIS — I10 Essential (primary) hypertension: Secondary | ICD-10-CM | POA: Diagnosis not present

## 2023-02-28 DIAGNOSIS — E119 Type 2 diabetes mellitus without complications: Secondary | ICD-10-CM | POA: Diagnosis not present

## 2023-02-28 DIAGNOSIS — G4700 Insomnia, unspecified: Secondary | ICD-10-CM | POA: Diagnosis not present

## 2023-03-02 DIAGNOSIS — R2689 Other abnormalities of gait and mobility: Secondary | ICD-10-CM | POA: Diagnosis not present

## 2023-03-02 DIAGNOSIS — Z9181 History of falling: Secondary | ICD-10-CM | POA: Diagnosis not present

## 2023-03-02 DIAGNOSIS — I10 Essential (primary) hypertension: Secondary | ICD-10-CM | POA: Diagnosis not present

## 2023-03-02 DIAGNOSIS — E119 Type 2 diabetes mellitus without complications: Secondary | ICD-10-CM | POA: Diagnosis not present

## 2023-03-02 DIAGNOSIS — I739 Peripheral vascular disease, unspecified: Secondary | ICD-10-CM | POA: Diagnosis not present

## 2023-03-02 DIAGNOSIS — M6281 Muscle weakness (generalized): Secondary | ICD-10-CM | POA: Diagnosis not present

## 2023-03-02 DIAGNOSIS — G4700 Insomnia, unspecified: Secondary | ICD-10-CM | POA: Diagnosis not present

## 2023-03-02 DIAGNOSIS — R2681 Unsteadiness on feet: Secondary | ICD-10-CM | POA: Diagnosis not present

## 2023-03-02 DIAGNOSIS — S42301D Unspecified fracture of shaft of humerus, right arm, subsequent encounter for fracture with routine healing: Secondary | ICD-10-CM | POA: Diagnosis not present

## 2023-03-02 DIAGNOSIS — H409 Unspecified glaucoma: Secondary | ICD-10-CM | POA: Diagnosis not present

## 2023-03-02 DIAGNOSIS — G459 Transient cerebral ischemic attack, unspecified: Secondary | ICD-10-CM | POA: Diagnosis not present

## 2023-03-02 DIAGNOSIS — R531 Weakness: Secondary | ICD-10-CM | POA: Diagnosis not present

## 2023-03-02 DIAGNOSIS — N400 Enlarged prostate without lower urinary tract symptoms: Secondary | ICD-10-CM | POA: Diagnosis not present

## 2023-03-02 DIAGNOSIS — R262 Difficulty in walking, not elsewhere classified: Secondary | ICD-10-CM | POA: Diagnosis not present

## 2023-03-02 DIAGNOSIS — R278 Other lack of coordination: Secondary | ICD-10-CM | POA: Diagnosis not present

## 2023-03-02 DIAGNOSIS — R296 Repeated falls: Secondary | ICD-10-CM | POA: Diagnosis not present

## 2023-03-02 DIAGNOSIS — Z7689 Persons encountering health services in other specified circumstances: Secondary | ICD-10-CM | POA: Diagnosis not present

## 2023-03-02 DIAGNOSIS — S42201A Unspecified fracture of upper end of right humerus, initial encounter for closed fracture: Secondary | ICD-10-CM | POA: Diagnosis not present

## 2023-03-02 DIAGNOSIS — E785 Hyperlipidemia, unspecified: Secondary | ICD-10-CM | POA: Diagnosis not present

## 2023-03-07 DIAGNOSIS — Z9181 History of falling: Secondary | ICD-10-CM

## 2023-03-07 DIAGNOSIS — R2689 Other abnormalities of gait and mobility: Secondary | ICD-10-CM | POA: Diagnosis not present

## 2023-03-07 DIAGNOSIS — M6281 Muscle weakness (generalized): Secondary | ICD-10-CM | POA: Diagnosis not present

## 2023-03-07 DIAGNOSIS — G4700 Insomnia, unspecified: Secondary | ICD-10-CM | POA: Diagnosis not present

## 2023-03-07 DIAGNOSIS — Z7689 Persons encountering health services in other specified circumstances: Secondary | ICD-10-CM | POA: Diagnosis not present

## 2023-03-08 DIAGNOSIS — M6281 Muscle weakness (generalized): Secondary | ICD-10-CM | POA: Diagnosis not present

## 2023-03-08 DIAGNOSIS — Z9181 History of falling: Secondary | ICD-10-CM | POA: Diagnosis not present

## 2023-03-08 DIAGNOSIS — G4700 Insomnia, unspecified: Secondary | ICD-10-CM | POA: Diagnosis not present

## 2023-03-08 DIAGNOSIS — R2689 Other abnormalities of gait and mobility: Secondary | ICD-10-CM | POA: Diagnosis not present

## 2023-03-09 DIAGNOSIS — Z7689 Persons encountering health services in other specified circumstances: Secondary | ICD-10-CM | POA: Diagnosis not present

## 2023-03-09 DIAGNOSIS — R296 Repeated falls: Secondary | ICD-10-CM | POA: Diagnosis not present

## 2023-03-09 DIAGNOSIS — R531 Weakness: Secondary | ICD-10-CM | POA: Diagnosis not present

## 2023-03-09 DIAGNOSIS — R2689 Other abnormalities of gait and mobility: Secondary | ICD-10-CM | POA: Diagnosis not present

## 2023-03-09 DIAGNOSIS — R262 Difficulty in walking, not elsewhere classified: Secondary | ICD-10-CM | POA: Diagnosis not present

## 2023-03-09 DIAGNOSIS — I10 Essential (primary) hypertension: Secondary | ICD-10-CM | POA: Diagnosis not present

## 2023-03-09 DIAGNOSIS — G4700 Insomnia, unspecified: Secondary | ICD-10-CM | POA: Diagnosis not present

## 2023-03-09 DIAGNOSIS — M6281 Muscle weakness (generalized): Secondary | ICD-10-CM | POA: Diagnosis not present

## 2023-03-10 DIAGNOSIS — Z7689 Persons encountering health services in other specified circumstances: Secondary | ICD-10-CM | POA: Diagnosis not present

## 2023-03-10 DIAGNOSIS — G4700 Insomnia, unspecified: Secondary | ICD-10-CM | POA: Diagnosis not present

## 2023-03-14 DIAGNOSIS — S42201A Unspecified fracture of upper end of right humerus, initial encounter for closed fracture: Secondary | ICD-10-CM | POA: Diagnosis not present

## 2023-03-30 DIAGNOSIS — G4700 Insomnia, unspecified: Secondary | ICD-10-CM | POA: Diagnosis not present

## 2023-03-30 DIAGNOSIS — R2689 Other abnormalities of gait and mobility: Secondary | ICD-10-CM | POA: Diagnosis not present

## 2023-03-30 DIAGNOSIS — M6281 Muscle weakness (generalized): Secondary | ICD-10-CM | POA: Diagnosis not present

## 2023-03-30 DIAGNOSIS — Z9181 History of falling: Secondary | ICD-10-CM | POA: Diagnosis not present

## 2023-03-30 DIAGNOSIS — Z7689 Persons encountering health services in other specified circumstances: Secondary | ICD-10-CM

## 2023-04-02 DIAGNOSIS — E785 Hyperlipidemia, unspecified: Secondary | ICD-10-CM | POA: Diagnosis not present

## 2023-04-02 DIAGNOSIS — I10 Essential (primary) hypertension: Secondary | ICD-10-CM | POA: Diagnosis not present

## 2023-04-02 DIAGNOSIS — Z7982 Long term (current) use of aspirin: Secondary | ICD-10-CM | POA: Diagnosis not present

## 2023-04-02 DIAGNOSIS — S42211D Unspecified displaced fracture of surgical neck of right humerus, subsequent encounter for fracture with routine healing: Secondary | ICD-10-CM | POA: Diagnosis not present

## 2023-04-02 DIAGNOSIS — Z7984 Long term (current) use of oral hypoglycemic drugs: Secondary | ICD-10-CM | POA: Diagnosis not present

## 2023-04-02 DIAGNOSIS — Z794 Long term (current) use of insulin: Secondary | ICD-10-CM | POA: Diagnosis not present

## 2023-04-02 DIAGNOSIS — Z8673 Personal history of transient ischemic attack (TIA), and cerebral infarction without residual deficits: Secondary | ICD-10-CM | POA: Diagnosis not present

## 2023-04-02 DIAGNOSIS — N400 Enlarged prostate without lower urinary tract symptoms: Secondary | ICD-10-CM | POA: Diagnosis not present

## 2023-04-02 DIAGNOSIS — Z7902 Long term (current) use of antithrombotics/antiplatelets: Secondary | ICD-10-CM | POA: Diagnosis not present

## 2023-04-02 DIAGNOSIS — E1151 Type 2 diabetes mellitus with diabetic peripheral angiopathy without gangrene: Secondary | ICD-10-CM | POA: Diagnosis not present

## 2023-04-02 DIAGNOSIS — Z9181 History of falling: Secondary | ICD-10-CM | POA: Diagnosis not present

## 2023-04-02 DIAGNOSIS — H409 Unspecified glaucoma: Secondary | ICD-10-CM | POA: Diagnosis not present

## 2023-04-02 DIAGNOSIS — G4700 Insomnia, unspecified: Secondary | ICD-10-CM | POA: Diagnosis not present

## 2023-04-02 DIAGNOSIS — W19XXXD Unspecified fall, subsequent encounter: Secondary | ICD-10-CM | POA: Diagnosis not present

## 2023-04-05 DIAGNOSIS — E1151 Type 2 diabetes mellitus with diabetic peripheral angiopathy without gangrene: Secondary | ICD-10-CM | POA: Diagnosis not present

## 2023-04-05 DIAGNOSIS — N400 Enlarged prostate without lower urinary tract symptoms: Secondary | ICD-10-CM | POA: Diagnosis not present

## 2023-04-05 DIAGNOSIS — E785 Hyperlipidemia, unspecified: Secondary | ICD-10-CM | POA: Diagnosis not present

## 2023-04-05 DIAGNOSIS — H409 Unspecified glaucoma: Secondary | ICD-10-CM | POA: Diagnosis not present

## 2023-04-05 DIAGNOSIS — Z7902 Long term (current) use of antithrombotics/antiplatelets: Secondary | ICD-10-CM | POA: Diagnosis not present

## 2023-04-05 DIAGNOSIS — Z9181 History of falling: Secondary | ICD-10-CM | POA: Diagnosis not present

## 2023-04-05 DIAGNOSIS — Z7982 Long term (current) use of aspirin: Secondary | ICD-10-CM | POA: Diagnosis not present

## 2023-04-05 DIAGNOSIS — Z8673 Personal history of transient ischemic attack (TIA), and cerebral infarction without residual deficits: Secondary | ICD-10-CM | POA: Diagnosis not present

## 2023-04-05 DIAGNOSIS — I10 Essential (primary) hypertension: Secondary | ICD-10-CM | POA: Diagnosis not present

## 2023-04-05 DIAGNOSIS — Z7984 Long term (current) use of oral hypoglycemic drugs: Secondary | ICD-10-CM | POA: Diagnosis not present

## 2023-04-05 DIAGNOSIS — Z794 Long term (current) use of insulin: Secondary | ICD-10-CM | POA: Diagnosis not present

## 2023-04-05 DIAGNOSIS — G4700 Insomnia, unspecified: Secondary | ICD-10-CM | POA: Diagnosis not present

## 2023-04-05 DIAGNOSIS — W19XXXD Unspecified fall, subsequent encounter: Secondary | ICD-10-CM | POA: Diagnosis not present

## 2023-04-05 DIAGNOSIS — S42211D Unspecified displaced fracture of surgical neck of right humerus, subsequent encounter for fracture with routine healing: Secondary | ICD-10-CM | POA: Diagnosis not present

## 2023-04-11 DIAGNOSIS — E785 Hyperlipidemia, unspecified: Secondary | ICD-10-CM | POA: Diagnosis not present

## 2023-04-11 DIAGNOSIS — W19XXXD Unspecified fall, subsequent encounter: Secondary | ICD-10-CM | POA: Diagnosis not present

## 2023-04-11 DIAGNOSIS — S42211D Unspecified displaced fracture of surgical neck of right humerus, subsequent encounter for fracture with routine healing: Secondary | ICD-10-CM | POA: Diagnosis not present

## 2023-04-11 DIAGNOSIS — I10 Essential (primary) hypertension: Secondary | ICD-10-CM | POA: Diagnosis not present

## 2023-04-11 DIAGNOSIS — N400 Enlarged prostate without lower urinary tract symptoms: Secondary | ICD-10-CM | POA: Diagnosis not present

## 2023-04-11 DIAGNOSIS — Z9181 History of falling: Secondary | ICD-10-CM | POA: Diagnosis not present

## 2023-04-11 DIAGNOSIS — E1151 Type 2 diabetes mellitus with diabetic peripheral angiopathy without gangrene: Secondary | ICD-10-CM | POA: Diagnosis not present

## 2023-04-11 DIAGNOSIS — H409 Unspecified glaucoma: Secondary | ICD-10-CM | POA: Diagnosis not present

## 2023-04-11 DIAGNOSIS — Z794 Long term (current) use of insulin: Secondary | ICD-10-CM | POA: Diagnosis not present

## 2023-04-11 DIAGNOSIS — Z7902 Long term (current) use of antithrombotics/antiplatelets: Secondary | ICD-10-CM | POA: Diagnosis not present

## 2023-04-11 DIAGNOSIS — Z7984 Long term (current) use of oral hypoglycemic drugs: Secondary | ICD-10-CM | POA: Diagnosis not present

## 2023-04-11 DIAGNOSIS — Z7982 Long term (current) use of aspirin: Secondary | ICD-10-CM | POA: Diagnosis not present

## 2023-04-11 DIAGNOSIS — G4700 Insomnia, unspecified: Secondary | ICD-10-CM | POA: Diagnosis not present

## 2023-04-11 DIAGNOSIS — Z8673 Personal history of transient ischemic attack (TIA), and cerebral infarction without residual deficits: Secondary | ICD-10-CM | POA: Diagnosis not present

## 2023-04-13 DIAGNOSIS — Z7984 Long term (current) use of oral hypoglycemic drugs: Secondary | ICD-10-CM | POA: Diagnosis not present

## 2023-04-13 DIAGNOSIS — E1151 Type 2 diabetes mellitus with diabetic peripheral angiopathy without gangrene: Secondary | ICD-10-CM | POA: Diagnosis not present

## 2023-04-13 DIAGNOSIS — Z9181 History of falling: Secondary | ICD-10-CM | POA: Diagnosis not present

## 2023-04-13 DIAGNOSIS — S42201A Unspecified fracture of upper end of right humerus, initial encounter for closed fracture: Secondary | ICD-10-CM | POA: Diagnosis not present

## 2023-04-13 DIAGNOSIS — I10 Essential (primary) hypertension: Secondary | ICD-10-CM | POA: Diagnosis not present

## 2023-04-13 DIAGNOSIS — Z7982 Long term (current) use of aspirin: Secondary | ICD-10-CM | POA: Diagnosis not present

## 2023-04-13 DIAGNOSIS — G4700 Insomnia, unspecified: Secondary | ICD-10-CM | POA: Diagnosis not present

## 2023-04-13 DIAGNOSIS — N400 Enlarged prostate without lower urinary tract symptoms: Secondary | ICD-10-CM | POA: Diagnosis not present

## 2023-04-13 DIAGNOSIS — Z7902 Long term (current) use of antithrombotics/antiplatelets: Secondary | ICD-10-CM | POA: Diagnosis not present

## 2023-04-13 DIAGNOSIS — W19XXXD Unspecified fall, subsequent encounter: Secondary | ICD-10-CM | POA: Diagnosis not present

## 2023-04-13 DIAGNOSIS — Z794 Long term (current) use of insulin: Secondary | ICD-10-CM | POA: Diagnosis not present

## 2023-04-13 DIAGNOSIS — Z8673 Personal history of transient ischemic attack (TIA), and cerebral infarction without residual deficits: Secondary | ICD-10-CM | POA: Diagnosis not present

## 2023-04-13 DIAGNOSIS — E785 Hyperlipidemia, unspecified: Secondary | ICD-10-CM | POA: Diagnosis not present

## 2023-04-13 DIAGNOSIS — S42211D Unspecified displaced fracture of surgical neck of right humerus, subsequent encounter for fracture with routine healing: Secondary | ICD-10-CM | POA: Diagnosis not present

## 2023-04-13 DIAGNOSIS — H409 Unspecified glaucoma: Secondary | ICD-10-CM | POA: Diagnosis not present

## 2023-04-17 DIAGNOSIS — I10 Essential (primary) hypertension: Secondary | ICD-10-CM | POA: Diagnosis not present

## 2023-04-17 DIAGNOSIS — S42211D Unspecified displaced fracture of surgical neck of right humerus, subsequent encounter for fracture with routine healing: Secondary | ICD-10-CM | POA: Diagnosis not present

## 2023-04-17 DIAGNOSIS — W19XXXD Unspecified fall, subsequent encounter: Secondary | ICD-10-CM | POA: Diagnosis not present

## 2023-04-17 DIAGNOSIS — G4700 Insomnia, unspecified: Secondary | ICD-10-CM | POA: Diagnosis not present

## 2023-04-17 DIAGNOSIS — Z7982 Long term (current) use of aspirin: Secondary | ICD-10-CM | POA: Diagnosis not present

## 2023-04-17 DIAGNOSIS — Z8673 Personal history of transient ischemic attack (TIA), and cerebral infarction without residual deficits: Secondary | ICD-10-CM | POA: Diagnosis not present

## 2023-04-17 DIAGNOSIS — Z7984 Long term (current) use of oral hypoglycemic drugs: Secondary | ICD-10-CM | POA: Diagnosis not present

## 2023-04-17 DIAGNOSIS — H409 Unspecified glaucoma: Secondary | ICD-10-CM | POA: Diagnosis not present

## 2023-04-17 DIAGNOSIS — N400 Enlarged prostate without lower urinary tract symptoms: Secondary | ICD-10-CM | POA: Diagnosis not present

## 2023-04-17 DIAGNOSIS — Z9181 History of falling: Secondary | ICD-10-CM | POA: Diagnosis not present

## 2023-04-17 DIAGNOSIS — Z7902 Long term (current) use of antithrombotics/antiplatelets: Secondary | ICD-10-CM | POA: Diagnosis not present

## 2023-04-17 DIAGNOSIS — E785 Hyperlipidemia, unspecified: Secondary | ICD-10-CM | POA: Diagnosis not present

## 2023-04-17 DIAGNOSIS — Z794 Long term (current) use of insulin: Secondary | ICD-10-CM | POA: Diagnosis not present

## 2023-04-17 DIAGNOSIS — E1151 Type 2 diabetes mellitus with diabetic peripheral angiopathy without gangrene: Secondary | ICD-10-CM | POA: Diagnosis not present

## 2023-04-19 DIAGNOSIS — Z7982 Long term (current) use of aspirin: Secondary | ICD-10-CM | POA: Diagnosis not present

## 2023-04-19 DIAGNOSIS — Z8673 Personal history of transient ischemic attack (TIA), and cerebral infarction without residual deficits: Secondary | ICD-10-CM | POA: Diagnosis not present

## 2023-04-19 DIAGNOSIS — H409 Unspecified glaucoma: Secondary | ICD-10-CM | POA: Diagnosis not present

## 2023-04-19 DIAGNOSIS — E1151 Type 2 diabetes mellitus with diabetic peripheral angiopathy without gangrene: Secondary | ICD-10-CM | POA: Diagnosis not present

## 2023-04-19 DIAGNOSIS — G4700 Insomnia, unspecified: Secondary | ICD-10-CM | POA: Diagnosis not present

## 2023-04-19 DIAGNOSIS — Z7984 Long term (current) use of oral hypoglycemic drugs: Secondary | ICD-10-CM | POA: Diagnosis not present

## 2023-04-19 DIAGNOSIS — W19XXXD Unspecified fall, subsequent encounter: Secondary | ICD-10-CM | POA: Diagnosis not present

## 2023-04-19 DIAGNOSIS — I10 Essential (primary) hypertension: Secondary | ICD-10-CM | POA: Diagnosis not present

## 2023-04-19 DIAGNOSIS — S42211D Unspecified displaced fracture of surgical neck of right humerus, subsequent encounter for fracture with routine healing: Secondary | ICD-10-CM | POA: Diagnosis not present

## 2023-04-19 DIAGNOSIS — N400 Enlarged prostate without lower urinary tract symptoms: Secondary | ICD-10-CM | POA: Diagnosis not present

## 2023-04-19 DIAGNOSIS — Z9181 History of falling: Secondary | ICD-10-CM | POA: Diagnosis not present

## 2023-04-19 DIAGNOSIS — Z7902 Long term (current) use of antithrombotics/antiplatelets: Secondary | ICD-10-CM | POA: Diagnosis not present

## 2023-04-19 DIAGNOSIS — Z794 Long term (current) use of insulin: Secondary | ICD-10-CM | POA: Diagnosis not present

## 2023-04-19 DIAGNOSIS — E785 Hyperlipidemia, unspecified: Secondary | ICD-10-CM | POA: Diagnosis not present

## 2023-04-20 DIAGNOSIS — N400 Enlarged prostate without lower urinary tract symptoms: Secondary | ICD-10-CM | POA: Diagnosis not present

## 2023-04-20 DIAGNOSIS — Z7902 Long term (current) use of antithrombotics/antiplatelets: Secondary | ICD-10-CM | POA: Diagnosis not present

## 2023-04-20 DIAGNOSIS — E785 Hyperlipidemia, unspecified: Secondary | ICD-10-CM | POA: Diagnosis not present

## 2023-04-20 DIAGNOSIS — Z9181 History of falling: Secondary | ICD-10-CM | POA: Diagnosis not present

## 2023-04-20 DIAGNOSIS — Z8673 Personal history of transient ischemic attack (TIA), and cerebral infarction without residual deficits: Secondary | ICD-10-CM | POA: Diagnosis not present

## 2023-04-20 DIAGNOSIS — Z7982 Long term (current) use of aspirin: Secondary | ICD-10-CM | POA: Diagnosis not present

## 2023-04-20 DIAGNOSIS — W19XXXD Unspecified fall, subsequent encounter: Secondary | ICD-10-CM | POA: Diagnosis not present

## 2023-04-20 DIAGNOSIS — E1151 Type 2 diabetes mellitus with diabetic peripheral angiopathy without gangrene: Secondary | ICD-10-CM | POA: Diagnosis not present

## 2023-04-20 DIAGNOSIS — H409 Unspecified glaucoma: Secondary | ICD-10-CM | POA: Diagnosis not present

## 2023-04-20 DIAGNOSIS — Z7984 Long term (current) use of oral hypoglycemic drugs: Secondary | ICD-10-CM | POA: Diagnosis not present

## 2023-04-20 DIAGNOSIS — G4700 Insomnia, unspecified: Secondary | ICD-10-CM | POA: Diagnosis not present

## 2023-04-20 DIAGNOSIS — I10 Essential (primary) hypertension: Secondary | ICD-10-CM | POA: Diagnosis not present

## 2023-04-20 DIAGNOSIS — S42211D Unspecified displaced fracture of surgical neck of right humerus, subsequent encounter for fracture with routine healing: Secondary | ICD-10-CM | POA: Diagnosis not present

## 2023-04-20 DIAGNOSIS — Z794 Long term (current) use of insulin: Secondary | ICD-10-CM | POA: Diagnosis not present

## 2023-04-25 DIAGNOSIS — Z7984 Long term (current) use of oral hypoglycemic drugs: Secondary | ICD-10-CM | POA: Diagnosis not present

## 2023-04-25 DIAGNOSIS — Z794 Long term (current) use of insulin: Secondary | ICD-10-CM | POA: Diagnosis not present

## 2023-04-25 DIAGNOSIS — Z9181 History of falling: Secondary | ICD-10-CM | POA: Diagnosis not present

## 2023-04-25 DIAGNOSIS — W19XXXD Unspecified fall, subsequent encounter: Secondary | ICD-10-CM | POA: Diagnosis not present

## 2023-04-25 DIAGNOSIS — Z7982 Long term (current) use of aspirin: Secondary | ICD-10-CM | POA: Diagnosis not present

## 2023-04-25 DIAGNOSIS — E785 Hyperlipidemia, unspecified: Secondary | ICD-10-CM | POA: Diagnosis not present

## 2023-04-25 DIAGNOSIS — H409 Unspecified glaucoma: Secondary | ICD-10-CM | POA: Diagnosis not present

## 2023-04-25 DIAGNOSIS — Z8673 Personal history of transient ischemic attack (TIA), and cerebral infarction without residual deficits: Secondary | ICD-10-CM | POA: Diagnosis not present

## 2023-04-25 DIAGNOSIS — S42211D Unspecified displaced fracture of surgical neck of right humerus, subsequent encounter for fracture with routine healing: Secondary | ICD-10-CM | POA: Diagnosis not present

## 2023-04-25 DIAGNOSIS — Z7902 Long term (current) use of antithrombotics/antiplatelets: Secondary | ICD-10-CM | POA: Diagnosis not present

## 2023-04-25 DIAGNOSIS — I10 Essential (primary) hypertension: Secondary | ICD-10-CM | POA: Diagnosis not present

## 2023-04-25 DIAGNOSIS — E1151 Type 2 diabetes mellitus with diabetic peripheral angiopathy without gangrene: Secondary | ICD-10-CM | POA: Diagnosis not present

## 2023-04-25 DIAGNOSIS — N400 Enlarged prostate without lower urinary tract symptoms: Secondary | ICD-10-CM | POA: Diagnosis not present

## 2023-04-25 DIAGNOSIS — G4700 Insomnia, unspecified: Secondary | ICD-10-CM | POA: Diagnosis not present

## 2023-04-26 DIAGNOSIS — E785 Hyperlipidemia, unspecified: Secondary | ICD-10-CM | POA: Diagnosis not present

## 2023-04-26 DIAGNOSIS — W19XXXD Unspecified fall, subsequent encounter: Secondary | ICD-10-CM | POA: Diagnosis not present

## 2023-04-26 DIAGNOSIS — Z9181 History of falling: Secondary | ICD-10-CM | POA: Diagnosis not present

## 2023-04-26 DIAGNOSIS — Z7984 Long term (current) use of oral hypoglycemic drugs: Secondary | ICD-10-CM | POA: Diagnosis not present

## 2023-04-26 DIAGNOSIS — Z7902 Long term (current) use of antithrombotics/antiplatelets: Secondary | ICD-10-CM | POA: Diagnosis not present

## 2023-04-26 DIAGNOSIS — H409 Unspecified glaucoma: Secondary | ICD-10-CM | POA: Diagnosis not present

## 2023-04-26 DIAGNOSIS — Z794 Long term (current) use of insulin: Secondary | ICD-10-CM | POA: Diagnosis not present

## 2023-04-26 DIAGNOSIS — E1151 Type 2 diabetes mellitus with diabetic peripheral angiopathy without gangrene: Secondary | ICD-10-CM | POA: Diagnosis not present

## 2023-04-26 DIAGNOSIS — G4700 Insomnia, unspecified: Secondary | ICD-10-CM | POA: Diagnosis not present

## 2023-04-26 DIAGNOSIS — I10 Essential (primary) hypertension: Secondary | ICD-10-CM | POA: Diagnosis not present

## 2023-04-26 DIAGNOSIS — N400 Enlarged prostate without lower urinary tract symptoms: Secondary | ICD-10-CM | POA: Diagnosis not present

## 2023-04-26 DIAGNOSIS — S42211D Unspecified displaced fracture of surgical neck of right humerus, subsequent encounter for fracture with routine healing: Secondary | ICD-10-CM | POA: Diagnosis not present

## 2023-04-26 DIAGNOSIS — Z8673 Personal history of transient ischemic attack (TIA), and cerebral infarction without residual deficits: Secondary | ICD-10-CM | POA: Diagnosis not present

## 2023-04-26 DIAGNOSIS — Z7982 Long term (current) use of aspirin: Secondary | ICD-10-CM | POA: Diagnosis not present

## 2023-05-02 DIAGNOSIS — E1151 Type 2 diabetes mellitus with diabetic peripheral angiopathy without gangrene: Secondary | ICD-10-CM | POA: Diagnosis not present

## 2023-05-02 DIAGNOSIS — Z8673 Personal history of transient ischemic attack (TIA), and cerebral infarction without residual deficits: Secondary | ICD-10-CM | POA: Diagnosis not present

## 2023-05-02 DIAGNOSIS — W19XXXD Unspecified fall, subsequent encounter: Secondary | ICD-10-CM | POA: Diagnosis not present

## 2023-05-02 DIAGNOSIS — Z9181 History of falling: Secondary | ICD-10-CM | POA: Diagnosis not present

## 2023-05-02 DIAGNOSIS — N400 Enlarged prostate without lower urinary tract symptoms: Secondary | ICD-10-CM | POA: Diagnosis not present

## 2023-05-02 DIAGNOSIS — S42211D Unspecified displaced fracture of surgical neck of right humerus, subsequent encounter for fracture with routine healing: Secondary | ICD-10-CM | POA: Diagnosis not present

## 2023-05-02 DIAGNOSIS — Z7984 Long term (current) use of oral hypoglycemic drugs: Secondary | ICD-10-CM | POA: Diagnosis not present

## 2023-05-02 DIAGNOSIS — Z794 Long term (current) use of insulin: Secondary | ICD-10-CM | POA: Diagnosis not present

## 2023-05-02 DIAGNOSIS — Z7982 Long term (current) use of aspirin: Secondary | ICD-10-CM | POA: Diagnosis not present

## 2023-05-02 DIAGNOSIS — H409 Unspecified glaucoma: Secondary | ICD-10-CM | POA: Diagnosis not present

## 2023-05-02 DIAGNOSIS — I10 Essential (primary) hypertension: Secondary | ICD-10-CM | POA: Diagnosis not present

## 2023-05-02 DIAGNOSIS — G4700 Insomnia, unspecified: Secondary | ICD-10-CM | POA: Diagnosis not present

## 2023-05-02 DIAGNOSIS — E785 Hyperlipidemia, unspecified: Secondary | ICD-10-CM | POA: Diagnosis not present

## 2023-05-02 DIAGNOSIS — Z7902 Long term (current) use of antithrombotics/antiplatelets: Secondary | ICD-10-CM | POA: Diagnosis not present

## 2023-05-04 DIAGNOSIS — W19XXXD Unspecified fall, subsequent encounter: Secondary | ICD-10-CM | POA: Diagnosis not present

## 2023-05-04 DIAGNOSIS — Z9181 History of falling: Secondary | ICD-10-CM | POA: Diagnosis not present

## 2023-05-04 DIAGNOSIS — I10 Essential (primary) hypertension: Secondary | ICD-10-CM | POA: Diagnosis not present

## 2023-05-04 DIAGNOSIS — E1151 Type 2 diabetes mellitus with diabetic peripheral angiopathy without gangrene: Secondary | ICD-10-CM | POA: Diagnosis not present

## 2023-05-04 DIAGNOSIS — Z7984 Long term (current) use of oral hypoglycemic drugs: Secondary | ICD-10-CM | POA: Diagnosis not present

## 2023-05-04 DIAGNOSIS — N400 Enlarged prostate without lower urinary tract symptoms: Secondary | ICD-10-CM | POA: Diagnosis not present

## 2023-05-04 DIAGNOSIS — E785 Hyperlipidemia, unspecified: Secondary | ICD-10-CM | POA: Diagnosis not present

## 2023-05-04 DIAGNOSIS — Z7982 Long term (current) use of aspirin: Secondary | ICD-10-CM | POA: Diagnosis not present

## 2023-05-04 DIAGNOSIS — H409 Unspecified glaucoma: Secondary | ICD-10-CM | POA: Diagnosis not present

## 2023-05-04 DIAGNOSIS — Z8673 Personal history of transient ischemic attack (TIA), and cerebral infarction without residual deficits: Secondary | ICD-10-CM | POA: Diagnosis not present

## 2023-05-04 DIAGNOSIS — Z7902 Long term (current) use of antithrombotics/antiplatelets: Secondary | ICD-10-CM | POA: Diagnosis not present

## 2023-05-04 DIAGNOSIS — Z794 Long term (current) use of insulin: Secondary | ICD-10-CM | POA: Diagnosis not present

## 2023-05-04 DIAGNOSIS — G4700 Insomnia, unspecified: Secondary | ICD-10-CM | POA: Diagnosis not present

## 2023-05-04 DIAGNOSIS — S42211D Unspecified displaced fracture of surgical neck of right humerus, subsequent encounter for fracture with routine healing: Secondary | ICD-10-CM | POA: Diagnosis not present

## 2023-05-10 DIAGNOSIS — Z7984 Long term (current) use of oral hypoglycemic drugs: Secondary | ICD-10-CM | POA: Diagnosis not present

## 2023-05-10 DIAGNOSIS — H409 Unspecified glaucoma: Secondary | ICD-10-CM | POA: Diagnosis not present

## 2023-05-10 DIAGNOSIS — Z9181 History of falling: Secondary | ICD-10-CM | POA: Diagnosis not present

## 2023-05-10 DIAGNOSIS — W19XXXD Unspecified fall, subsequent encounter: Secondary | ICD-10-CM | POA: Diagnosis not present

## 2023-05-10 DIAGNOSIS — S42211D Unspecified displaced fracture of surgical neck of right humerus, subsequent encounter for fracture with routine healing: Secondary | ICD-10-CM | POA: Diagnosis not present

## 2023-05-10 DIAGNOSIS — Z7982 Long term (current) use of aspirin: Secondary | ICD-10-CM | POA: Diagnosis not present

## 2023-05-10 DIAGNOSIS — E1151 Type 2 diabetes mellitus with diabetic peripheral angiopathy without gangrene: Secondary | ICD-10-CM | POA: Diagnosis not present

## 2023-05-10 DIAGNOSIS — N400 Enlarged prostate without lower urinary tract symptoms: Secondary | ICD-10-CM | POA: Diagnosis not present

## 2023-05-10 DIAGNOSIS — Z794 Long term (current) use of insulin: Secondary | ICD-10-CM | POA: Diagnosis not present

## 2023-05-10 DIAGNOSIS — I10 Essential (primary) hypertension: Secondary | ICD-10-CM | POA: Diagnosis not present

## 2023-05-10 DIAGNOSIS — Z7902 Long term (current) use of antithrombotics/antiplatelets: Secondary | ICD-10-CM | POA: Diagnosis not present

## 2023-05-10 DIAGNOSIS — Z8673 Personal history of transient ischemic attack (TIA), and cerebral infarction without residual deficits: Secondary | ICD-10-CM | POA: Diagnosis not present

## 2023-05-10 DIAGNOSIS — E785 Hyperlipidemia, unspecified: Secondary | ICD-10-CM | POA: Diagnosis not present

## 2023-05-10 DIAGNOSIS — G4700 Insomnia, unspecified: Secondary | ICD-10-CM | POA: Diagnosis not present

## 2023-05-12 DIAGNOSIS — N400 Enlarged prostate without lower urinary tract symptoms: Secondary | ICD-10-CM | POA: Diagnosis not present

## 2023-05-12 DIAGNOSIS — Z8673 Personal history of transient ischemic attack (TIA), and cerebral infarction without residual deficits: Secondary | ICD-10-CM | POA: Diagnosis not present

## 2023-05-12 DIAGNOSIS — I10 Essential (primary) hypertension: Secondary | ICD-10-CM | POA: Diagnosis not present

## 2023-05-12 DIAGNOSIS — H409 Unspecified glaucoma: Secondary | ICD-10-CM | POA: Diagnosis not present

## 2023-05-12 DIAGNOSIS — G4700 Insomnia, unspecified: Secondary | ICD-10-CM | POA: Diagnosis not present

## 2023-05-12 DIAGNOSIS — Z7984 Long term (current) use of oral hypoglycemic drugs: Secondary | ICD-10-CM | POA: Diagnosis not present

## 2023-05-12 DIAGNOSIS — S42211D Unspecified displaced fracture of surgical neck of right humerus, subsequent encounter for fracture with routine healing: Secondary | ICD-10-CM | POA: Diagnosis not present

## 2023-05-12 DIAGNOSIS — E785 Hyperlipidemia, unspecified: Secondary | ICD-10-CM | POA: Diagnosis not present

## 2023-05-12 DIAGNOSIS — Z7982 Long term (current) use of aspirin: Secondary | ICD-10-CM | POA: Diagnosis not present

## 2023-05-12 DIAGNOSIS — Z794 Long term (current) use of insulin: Secondary | ICD-10-CM | POA: Diagnosis not present

## 2023-05-12 DIAGNOSIS — W19XXXD Unspecified fall, subsequent encounter: Secondary | ICD-10-CM | POA: Diagnosis not present

## 2023-05-12 DIAGNOSIS — Z9181 History of falling: Secondary | ICD-10-CM | POA: Diagnosis not present

## 2023-05-12 DIAGNOSIS — E1151 Type 2 diabetes mellitus with diabetic peripheral angiopathy without gangrene: Secondary | ICD-10-CM | POA: Diagnosis not present

## 2023-05-12 DIAGNOSIS — Z7902 Long term (current) use of antithrombotics/antiplatelets: Secondary | ICD-10-CM | POA: Diagnosis not present

## 2023-05-16 DIAGNOSIS — M67911 Unspecified disorder of synovium and tendon, right shoulder: Secondary | ICD-10-CM | POA: Diagnosis not present

## 2023-05-16 DIAGNOSIS — M19011 Primary osteoarthritis, right shoulder: Secondary | ICD-10-CM | POA: Diagnosis not present

## 2023-05-17 DIAGNOSIS — Z7984 Long term (current) use of oral hypoglycemic drugs: Secondary | ICD-10-CM | POA: Diagnosis not present

## 2023-05-17 DIAGNOSIS — Z794 Long term (current) use of insulin: Secondary | ICD-10-CM | POA: Diagnosis not present

## 2023-05-17 DIAGNOSIS — Z8673 Personal history of transient ischemic attack (TIA), and cerebral infarction without residual deficits: Secondary | ICD-10-CM | POA: Diagnosis not present

## 2023-05-17 DIAGNOSIS — E785 Hyperlipidemia, unspecified: Secondary | ICD-10-CM | POA: Diagnosis not present

## 2023-05-17 DIAGNOSIS — Z7902 Long term (current) use of antithrombotics/antiplatelets: Secondary | ICD-10-CM | POA: Diagnosis not present

## 2023-05-17 DIAGNOSIS — W19XXXD Unspecified fall, subsequent encounter: Secondary | ICD-10-CM | POA: Diagnosis not present

## 2023-05-17 DIAGNOSIS — H409 Unspecified glaucoma: Secondary | ICD-10-CM | POA: Diagnosis not present

## 2023-05-17 DIAGNOSIS — N400 Enlarged prostate without lower urinary tract symptoms: Secondary | ICD-10-CM | POA: Diagnosis not present

## 2023-05-17 DIAGNOSIS — S42211D Unspecified displaced fracture of surgical neck of right humerus, subsequent encounter for fracture with routine healing: Secondary | ICD-10-CM | POA: Diagnosis not present

## 2023-05-17 DIAGNOSIS — E1151 Type 2 diabetes mellitus with diabetic peripheral angiopathy without gangrene: Secondary | ICD-10-CM | POA: Diagnosis not present

## 2023-05-17 DIAGNOSIS — G4700 Insomnia, unspecified: Secondary | ICD-10-CM | POA: Diagnosis not present

## 2023-05-17 DIAGNOSIS — I10 Essential (primary) hypertension: Secondary | ICD-10-CM | POA: Diagnosis not present

## 2023-05-17 DIAGNOSIS — Z9181 History of falling: Secondary | ICD-10-CM | POA: Diagnosis not present

## 2023-05-17 DIAGNOSIS — Z7982 Long term (current) use of aspirin: Secondary | ICD-10-CM | POA: Diagnosis not present

## 2023-06-29 DIAGNOSIS — M25511 Pain in right shoulder: Secondary | ICD-10-CM | POA: Diagnosis not present

## 2023-08-29 DIAGNOSIS — Z125 Encounter for screening for malignant neoplasm of prostate: Secondary | ICD-10-CM | POA: Diagnosis not present

## 2023-08-29 DIAGNOSIS — E11319 Type 2 diabetes mellitus with unspecified diabetic retinopathy without macular edema: Secondary | ICD-10-CM | POA: Diagnosis not present

## 2023-08-29 DIAGNOSIS — E785 Hyperlipidemia, unspecified: Secondary | ICD-10-CM | POA: Diagnosis not present

## 2023-09-05 DIAGNOSIS — Z0000 Encounter for general adult medical examination without abnormal findings: Secondary | ICD-10-CM | POA: Diagnosis not present

## 2023-09-05 DIAGNOSIS — I10 Essential (primary) hypertension: Secondary | ICD-10-CM | POA: Diagnosis not present

## 2023-09-05 DIAGNOSIS — Z23 Encounter for immunization: Secondary | ICD-10-CM | POA: Diagnosis not present

## 2023-09-05 DIAGNOSIS — E1151 Type 2 diabetes mellitus with diabetic peripheral angiopathy without gangrene: Secondary | ICD-10-CM | POA: Diagnosis not present

## 2023-11-27 DIAGNOSIS — S61309A Unspecified open wound of unspecified finger with damage to nail, initial encounter: Secondary | ICD-10-CM | POA: Diagnosis not present

## 2023-11-27 DIAGNOSIS — S61300A Unspecified open wound of right index finger with damage to nail, initial encounter: Secondary | ICD-10-CM | POA: Diagnosis not present

## 2024-05-10 DIAGNOSIS — E119 Type 2 diabetes mellitus without complications: Secondary | ICD-10-CM | POA: Diagnosis not present

## 2024-05-10 DIAGNOSIS — R296 Repeated falls: Secondary | ICD-10-CM | POA: Diagnosis not present

## 2024-05-16 DIAGNOSIS — Z7984 Long term (current) use of oral hypoglycemic drugs: Secondary | ICD-10-CM | POA: Diagnosis not present

## 2024-05-16 DIAGNOSIS — I10 Essential (primary) hypertension: Secondary | ICD-10-CM | POA: Diagnosis not present

## 2024-05-16 DIAGNOSIS — Z7902 Long term (current) use of antithrombotics/antiplatelets: Secondary | ICD-10-CM | POA: Diagnosis not present

## 2024-05-16 DIAGNOSIS — N400 Enlarged prostate without lower urinary tract symptoms: Secondary | ICD-10-CM | POA: Diagnosis not present

## 2024-05-16 DIAGNOSIS — K5900 Constipation, unspecified: Secondary | ICD-10-CM | POA: Diagnosis not present

## 2024-05-16 DIAGNOSIS — Z9089 Acquired absence of other organs: Secondary | ICD-10-CM | POA: Diagnosis not present

## 2024-05-16 DIAGNOSIS — E785 Hyperlipidemia, unspecified: Secondary | ICD-10-CM | POA: Diagnosis not present

## 2024-05-16 DIAGNOSIS — M21372 Foot drop, left foot: Secondary | ICD-10-CM | POA: Diagnosis not present

## 2024-05-16 DIAGNOSIS — R296 Repeated falls: Secondary | ICD-10-CM | POA: Diagnosis not present

## 2024-05-16 DIAGNOSIS — E119 Type 2 diabetes mellitus without complications: Secondary | ICD-10-CM | POA: Diagnosis not present

## 2024-05-16 DIAGNOSIS — M62542 Muscle wasting and atrophy, not elsewhere classified, left hand: Secondary | ICD-10-CM | POA: Diagnosis not present

## 2024-05-16 DIAGNOSIS — Z8673 Personal history of transient ischemic attack (TIA), and cerebral infarction without residual deficits: Secondary | ICD-10-CM | POA: Diagnosis not present

## 2024-05-16 DIAGNOSIS — Z9181 History of falling: Secondary | ICD-10-CM | POA: Diagnosis not present

## 2024-05-16 DIAGNOSIS — Z556 Problems related to health literacy: Secondary | ICD-10-CM | POA: Diagnosis not present

## 2024-05-16 DIAGNOSIS — Z9049 Acquired absence of other specified parts of digestive tract: Secondary | ICD-10-CM | POA: Diagnosis not present

## 2024-05-16 DIAGNOSIS — Z794 Long term (current) use of insulin: Secondary | ICD-10-CM | POA: Diagnosis not present

## 2024-05-16 DIAGNOSIS — H409 Unspecified glaucoma: Secondary | ICD-10-CM | POA: Diagnosis not present

## 2024-05-16 DIAGNOSIS — Z5982 Transportation insecurity: Secondary | ICD-10-CM | POA: Diagnosis not present

## 2024-05-17 DIAGNOSIS — N400 Enlarged prostate without lower urinary tract symptoms: Secondary | ICD-10-CM | POA: Diagnosis not present

## 2024-05-19 DIAGNOSIS — Z7902 Long term (current) use of antithrombotics/antiplatelets: Secondary | ICD-10-CM | POA: Diagnosis not present

## 2024-05-19 DIAGNOSIS — Z7984 Long term (current) use of oral hypoglycemic drugs: Secondary | ICD-10-CM | POA: Diagnosis not present

## 2024-05-19 DIAGNOSIS — Z9089 Acquired absence of other organs: Secondary | ICD-10-CM | POA: Diagnosis not present

## 2024-05-19 DIAGNOSIS — H409 Unspecified glaucoma: Secondary | ICD-10-CM | POA: Diagnosis not present

## 2024-05-19 DIAGNOSIS — Z556 Problems related to health literacy: Secondary | ICD-10-CM | POA: Diagnosis not present

## 2024-05-19 DIAGNOSIS — R296 Repeated falls: Secondary | ICD-10-CM | POA: Diagnosis not present

## 2024-05-19 DIAGNOSIS — M21372 Foot drop, left foot: Secondary | ICD-10-CM | POA: Diagnosis not present

## 2024-05-19 DIAGNOSIS — Z5982 Transportation insecurity: Secondary | ICD-10-CM | POA: Diagnosis not present

## 2024-05-19 DIAGNOSIS — Z8673 Personal history of transient ischemic attack (TIA), and cerebral infarction without residual deficits: Secondary | ICD-10-CM | POA: Diagnosis not present

## 2024-05-19 DIAGNOSIS — N400 Enlarged prostate without lower urinary tract symptoms: Secondary | ICD-10-CM | POA: Diagnosis not present

## 2024-05-19 DIAGNOSIS — Z9181 History of falling: Secondary | ICD-10-CM | POA: Diagnosis not present

## 2024-05-19 DIAGNOSIS — E119 Type 2 diabetes mellitus without complications: Secondary | ICD-10-CM | POA: Diagnosis not present

## 2024-05-19 DIAGNOSIS — Z9049 Acquired absence of other specified parts of digestive tract: Secondary | ICD-10-CM | POA: Diagnosis not present

## 2024-05-19 DIAGNOSIS — I10 Essential (primary) hypertension: Secondary | ICD-10-CM | POA: Diagnosis not present

## 2024-05-19 DIAGNOSIS — E785 Hyperlipidemia, unspecified: Secondary | ICD-10-CM | POA: Diagnosis not present

## 2024-05-19 DIAGNOSIS — K5900 Constipation, unspecified: Secondary | ICD-10-CM | POA: Diagnosis not present

## 2024-05-19 DIAGNOSIS — M62542 Muscle wasting and atrophy, not elsewhere classified, left hand: Secondary | ICD-10-CM | POA: Diagnosis not present

## 2024-05-19 DIAGNOSIS — Z794 Long term (current) use of insulin: Secondary | ICD-10-CM | POA: Diagnosis not present

## 2024-05-22 DIAGNOSIS — Z556 Problems related to health literacy: Secondary | ICD-10-CM | POA: Diagnosis not present

## 2024-05-22 DIAGNOSIS — M21372 Foot drop, left foot: Secondary | ICD-10-CM | POA: Diagnosis not present

## 2024-05-22 DIAGNOSIS — Z9181 History of falling: Secondary | ICD-10-CM | POA: Diagnosis not present

## 2024-05-22 DIAGNOSIS — Z9089 Acquired absence of other organs: Secondary | ICD-10-CM | POA: Diagnosis not present

## 2024-05-22 DIAGNOSIS — N400 Enlarged prostate without lower urinary tract symptoms: Secondary | ICD-10-CM | POA: Diagnosis not present

## 2024-05-22 DIAGNOSIS — I10 Essential (primary) hypertension: Secondary | ICD-10-CM | POA: Diagnosis not present

## 2024-05-22 DIAGNOSIS — Z7902 Long term (current) use of antithrombotics/antiplatelets: Secondary | ICD-10-CM | POA: Diagnosis not present

## 2024-05-22 DIAGNOSIS — K5900 Constipation, unspecified: Secondary | ICD-10-CM | POA: Diagnosis not present

## 2024-05-22 DIAGNOSIS — Z794 Long term (current) use of insulin: Secondary | ICD-10-CM | POA: Diagnosis not present

## 2024-05-22 DIAGNOSIS — R296 Repeated falls: Secondary | ICD-10-CM | POA: Diagnosis not present

## 2024-05-22 DIAGNOSIS — E119 Type 2 diabetes mellitus without complications: Secondary | ICD-10-CM | POA: Diagnosis not present

## 2024-05-22 DIAGNOSIS — E785 Hyperlipidemia, unspecified: Secondary | ICD-10-CM | POA: Diagnosis not present

## 2024-05-22 DIAGNOSIS — Z9049 Acquired absence of other specified parts of digestive tract: Secondary | ICD-10-CM | POA: Diagnosis not present

## 2024-05-22 DIAGNOSIS — Z8673 Personal history of transient ischemic attack (TIA), and cerebral infarction without residual deficits: Secondary | ICD-10-CM | POA: Diagnosis not present

## 2024-05-22 DIAGNOSIS — M62542 Muscle wasting and atrophy, not elsewhere classified, left hand: Secondary | ICD-10-CM | POA: Diagnosis not present

## 2024-05-22 DIAGNOSIS — Z5982 Transportation insecurity: Secondary | ICD-10-CM | POA: Diagnosis not present

## 2024-05-22 DIAGNOSIS — H409 Unspecified glaucoma: Secondary | ICD-10-CM | POA: Diagnosis not present

## 2024-05-22 DIAGNOSIS — Z7984 Long term (current) use of oral hypoglycemic drugs: Secondary | ICD-10-CM | POA: Diagnosis not present

## 2024-05-24 DIAGNOSIS — R296 Repeated falls: Secondary | ICD-10-CM | POA: Diagnosis not present

## 2024-05-24 DIAGNOSIS — E785 Hyperlipidemia, unspecified: Secondary | ICD-10-CM | POA: Diagnosis not present

## 2024-05-24 DIAGNOSIS — N400 Enlarged prostate without lower urinary tract symptoms: Secondary | ICD-10-CM | POA: Diagnosis not present

## 2024-05-24 DIAGNOSIS — Z7984 Long term (current) use of oral hypoglycemic drugs: Secondary | ICD-10-CM | POA: Diagnosis not present

## 2024-05-24 DIAGNOSIS — Z9089 Acquired absence of other organs: Secondary | ICD-10-CM | POA: Diagnosis not present

## 2024-05-24 DIAGNOSIS — Z9049 Acquired absence of other specified parts of digestive tract: Secondary | ICD-10-CM | POA: Diagnosis not present

## 2024-05-24 DIAGNOSIS — H409 Unspecified glaucoma: Secondary | ICD-10-CM | POA: Diagnosis not present

## 2024-05-24 DIAGNOSIS — Z8673 Personal history of transient ischemic attack (TIA), and cerebral infarction without residual deficits: Secondary | ICD-10-CM | POA: Diagnosis not present

## 2024-05-24 DIAGNOSIS — I10 Essential (primary) hypertension: Secondary | ICD-10-CM | POA: Diagnosis not present

## 2024-05-24 DIAGNOSIS — Z9181 History of falling: Secondary | ICD-10-CM | POA: Diagnosis not present

## 2024-05-24 DIAGNOSIS — K5900 Constipation, unspecified: Secondary | ICD-10-CM | POA: Diagnosis not present

## 2024-05-24 DIAGNOSIS — E119 Type 2 diabetes mellitus without complications: Secondary | ICD-10-CM | POA: Diagnosis not present

## 2024-05-24 DIAGNOSIS — Z5982 Transportation insecurity: Secondary | ICD-10-CM | POA: Diagnosis not present

## 2024-05-24 DIAGNOSIS — Z794 Long term (current) use of insulin: Secondary | ICD-10-CM | POA: Diagnosis not present

## 2024-05-24 DIAGNOSIS — Z556 Problems related to health literacy: Secondary | ICD-10-CM | POA: Diagnosis not present

## 2024-05-24 DIAGNOSIS — M21372 Foot drop, left foot: Secondary | ICD-10-CM | POA: Diagnosis not present

## 2024-05-24 DIAGNOSIS — Z7902 Long term (current) use of antithrombotics/antiplatelets: Secondary | ICD-10-CM | POA: Diagnosis not present

## 2024-05-24 DIAGNOSIS — M62542 Muscle wasting and atrophy, not elsewhere classified, left hand: Secondary | ICD-10-CM | POA: Diagnosis not present

## 2024-05-29 DIAGNOSIS — Z9049 Acquired absence of other specified parts of digestive tract: Secondary | ICD-10-CM | POA: Diagnosis not present

## 2024-05-29 DIAGNOSIS — Z5982 Transportation insecurity: Secondary | ICD-10-CM | POA: Diagnosis not present

## 2024-05-29 DIAGNOSIS — Z9181 History of falling: Secondary | ICD-10-CM | POA: Diagnosis not present

## 2024-05-29 DIAGNOSIS — Z7984 Long term (current) use of oral hypoglycemic drugs: Secondary | ICD-10-CM | POA: Diagnosis not present

## 2024-05-29 DIAGNOSIS — Z556 Problems related to health literacy: Secondary | ICD-10-CM | POA: Diagnosis not present

## 2024-05-29 DIAGNOSIS — N400 Enlarged prostate without lower urinary tract symptoms: Secondary | ICD-10-CM | POA: Diagnosis not present

## 2024-05-29 DIAGNOSIS — E119 Type 2 diabetes mellitus without complications: Secondary | ICD-10-CM | POA: Diagnosis not present

## 2024-05-29 DIAGNOSIS — Z9089 Acquired absence of other organs: Secondary | ICD-10-CM | POA: Diagnosis not present

## 2024-05-29 DIAGNOSIS — R296 Repeated falls: Secondary | ICD-10-CM | POA: Diagnosis not present

## 2024-05-29 DIAGNOSIS — M21372 Foot drop, left foot: Secondary | ICD-10-CM | POA: Diagnosis not present

## 2024-05-29 DIAGNOSIS — H409 Unspecified glaucoma: Secondary | ICD-10-CM | POA: Diagnosis not present

## 2024-05-29 DIAGNOSIS — E785 Hyperlipidemia, unspecified: Secondary | ICD-10-CM | POA: Diagnosis not present

## 2024-05-29 DIAGNOSIS — Z7902 Long term (current) use of antithrombotics/antiplatelets: Secondary | ICD-10-CM | POA: Diagnosis not present

## 2024-05-29 DIAGNOSIS — I10 Essential (primary) hypertension: Secondary | ICD-10-CM | POA: Diagnosis not present

## 2024-05-29 DIAGNOSIS — Z8673 Personal history of transient ischemic attack (TIA), and cerebral infarction without residual deficits: Secondary | ICD-10-CM | POA: Diagnosis not present

## 2024-05-29 DIAGNOSIS — M62542 Muscle wasting and atrophy, not elsewhere classified, left hand: Secondary | ICD-10-CM | POA: Diagnosis not present

## 2024-05-29 DIAGNOSIS — Z794 Long term (current) use of insulin: Secondary | ICD-10-CM | POA: Diagnosis not present

## 2024-05-29 DIAGNOSIS — K5900 Constipation, unspecified: Secondary | ICD-10-CM | POA: Diagnosis not present

## 2024-05-31 DIAGNOSIS — Z9089 Acquired absence of other organs: Secondary | ICD-10-CM | POA: Diagnosis not present

## 2024-05-31 DIAGNOSIS — Z794 Long term (current) use of insulin: Secondary | ICD-10-CM | POA: Diagnosis not present

## 2024-05-31 DIAGNOSIS — H409 Unspecified glaucoma: Secondary | ICD-10-CM | POA: Diagnosis not present

## 2024-05-31 DIAGNOSIS — Z7984 Long term (current) use of oral hypoglycemic drugs: Secondary | ICD-10-CM | POA: Diagnosis not present

## 2024-05-31 DIAGNOSIS — N400 Enlarged prostate without lower urinary tract symptoms: Secondary | ICD-10-CM | POA: Diagnosis not present

## 2024-05-31 DIAGNOSIS — Z5982 Transportation insecurity: Secondary | ICD-10-CM | POA: Diagnosis not present

## 2024-05-31 DIAGNOSIS — Z7902 Long term (current) use of antithrombotics/antiplatelets: Secondary | ICD-10-CM | POA: Diagnosis not present

## 2024-05-31 DIAGNOSIS — Z7189 Other specified counseling: Secondary | ICD-10-CM | POA: Diagnosis not present

## 2024-05-31 DIAGNOSIS — K5900 Constipation, unspecified: Secondary | ICD-10-CM | POA: Diagnosis not present

## 2024-05-31 DIAGNOSIS — E785 Hyperlipidemia, unspecified: Secondary | ICD-10-CM | POA: Diagnosis not present

## 2024-05-31 DIAGNOSIS — M21372 Foot drop, left foot: Secondary | ICD-10-CM | POA: Diagnosis not present

## 2024-05-31 DIAGNOSIS — Z8673 Personal history of transient ischemic attack (TIA), and cerebral infarction without residual deficits: Secondary | ICD-10-CM | POA: Diagnosis not present

## 2024-05-31 DIAGNOSIS — M62542 Muscle wasting and atrophy, not elsewhere classified, left hand: Secondary | ICD-10-CM | POA: Diagnosis not present

## 2024-05-31 DIAGNOSIS — Z9049 Acquired absence of other specified parts of digestive tract: Secondary | ICD-10-CM | POA: Diagnosis not present

## 2024-05-31 DIAGNOSIS — R296 Repeated falls: Secondary | ICD-10-CM | POA: Diagnosis not present

## 2024-05-31 DIAGNOSIS — I10 Essential (primary) hypertension: Secondary | ICD-10-CM | POA: Diagnosis not present

## 2024-05-31 DIAGNOSIS — Z9181 History of falling: Secondary | ICD-10-CM | POA: Diagnosis not present

## 2024-05-31 DIAGNOSIS — E119 Type 2 diabetes mellitus without complications: Secondary | ICD-10-CM | POA: Diagnosis not present

## 2024-05-31 DIAGNOSIS — Z556 Problems related to health literacy: Secondary | ICD-10-CM | POA: Diagnosis not present

## 2024-05-31 DIAGNOSIS — L602 Onychogryphosis: Secondary | ICD-10-CM | POA: Diagnosis not present

## 2024-06-03 DIAGNOSIS — N400 Enlarged prostate without lower urinary tract symptoms: Secondary | ICD-10-CM | POA: Diagnosis not present

## 2024-06-03 DIAGNOSIS — Z9181 History of falling: Secondary | ICD-10-CM | POA: Diagnosis not present

## 2024-06-03 DIAGNOSIS — Z5982 Transportation insecurity: Secondary | ICD-10-CM | POA: Diagnosis not present

## 2024-06-03 DIAGNOSIS — Z9089 Acquired absence of other organs: Secondary | ICD-10-CM | POA: Diagnosis not present

## 2024-06-03 DIAGNOSIS — M21372 Foot drop, left foot: Secondary | ICD-10-CM | POA: Diagnosis not present

## 2024-06-03 DIAGNOSIS — Z9049 Acquired absence of other specified parts of digestive tract: Secondary | ICD-10-CM | POA: Diagnosis not present

## 2024-06-03 DIAGNOSIS — E119 Type 2 diabetes mellitus without complications: Secondary | ICD-10-CM | POA: Diagnosis not present

## 2024-06-03 DIAGNOSIS — R296 Repeated falls: Secondary | ICD-10-CM | POA: Diagnosis not present

## 2024-06-03 DIAGNOSIS — Z7902 Long term (current) use of antithrombotics/antiplatelets: Secondary | ICD-10-CM | POA: Diagnosis not present

## 2024-06-03 DIAGNOSIS — E785 Hyperlipidemia, unspecified: Secondary | ICD-10-CM | POA: Diagnosis not present

## 2024-06-03 DIAGNOSIS — Z7984 Long term (current) use of oral hypoglycemic drugs: Secondary | ICD-10-CM | POA: Diagnosis not present

## 2024-06-03 DIAGNOSIS — H409 Unspecified glaucoma: Secondary | ICD-10-CM | POA: Diagnosis not present

## 2024-06-03 DIAGNOSIS — M62542 Muscle wasting and atrophy, not elsewhere classified, left hand: Secondary | ICD-10-CM | POA: Diagnosis not present

## 2024-06-03 DIAGNOSIS — I10 Essential (primary) hypertension: Secondary | ICD-10-CM | POA: Diagnosis not present

## 2024-06-03 DIAGNOSIS — Z8673 Personal history of transient ischemic attack (TIA), and cerebral infarction without residual deficits: Secondary | ICD-10-CM | POA: Diagnosis not present

## 2024-06-03 DIAGNOSIS — Z794 Long term (current) use of insulin: Secondary | ICD-10-CM | POA: Diagnosis not present

## 2024-06-03 DIAGNOSIS — Z556 Problems related to health literacy: Secondary | ICD-10-CM | POA: Diagnosis not present

## 2024-06-03 DIAGNOSIS — K5900 Constipation, unspecified: Secondary | ICD-10-CM | POA: Diagnosis not present

## 2024-06-04 DIAGNOSIS — Z5982 Transportation insecurity: Secondary | ICD-10-CM | POA: Diagnosis not present

## 2024-06-04 DIAGNOSIS — N400 Enlarged prostate without lower urinary tract symptoms: Secondary | ICD-10-CM | POA: Diagnosis not present

## 2024-06-04 DIAGNOSIS — K5900 Constipation, unspecified: Secondary | ICD-10-CM | POA: Diagnosis not present

## 2024-06-04 DIAGNOSIS — Z9089 Acquired absence of other organs: Secondary | ICD-10-CM | POA: Diagnosis not present

## 2024-06-04 DIAGNOSIS — Z9049 Acquired absence of other specified parts of digestive tract: Secondary | ICD-10-CM | POA: Diagnosis not present

## 2024-06-04 DIAGNOSIS — I10 Essential (primary) hypertension: Secondary | ICD-10-CM | POA: Diagnosis not present

## 2024-06-04 DIAGNOSIS — E119 Type 2 diabetes mellitus without complications: Secondary | ICD-10-CM | POA: Diagnosis not present

## 2024-06-04 DIAGNOSIS — Z7902 Long term (current) use of antithrombotics/antiplatelets: Secondary | ICD-10-CM | POA: Diagnosis not present

## 2024-06-04 DIAGNOSIS — Z556 Problems related to health literacy: Secondary | ICD-10-CM | POA: Diagnosis not present

## 2024-06-04 DIAGNOSIS — Z794 Long term (current) use of insulin: Secondary | ICD-10-CM | POA: Diagnosis not present

## 2024-06-04 DIAGNOSIS — Z9181 History of falling: Secondary | ICD-10-CM | POA: Diagnosis not present

## 2024-06-04 DIAGNOSIS — H409 Unspecified glaucoma: Secondary | ICD-10-CM | POA: Diagnosis not present

## 2024-06-04 DIAGNOSIS — Z7984 Long term (current) use of oral hypoglycemic drugs: Secondary | ICD-10-CM | POA: Diagnosis not present

## 2024-06-04 DIAGNOSIS — R296 Repeated falls: Secondary | ICD-10-CM | POA: Diagnosis not present

## 2024-06-04 DIAGNOSIS — M62542 Muscle wasting and atrophy, not elsewhere classified, left hand: Secondary | ICD-10-CM | POA: Diagnosis not present

## 2024-06-04 DIAGNOSIS — E785 Hyperlipidemia, unspecified: Secondary | ICD-10-CM | POA: Diagnosis not present

## 2024-06-04 DIAGNOSIS — M21372 Foot drop, left foot: Secondary | ICD-10-CM | POA: Diagnosis not present

## 2024-06-04 DIAGNOSIS — Z8673 Personal history of transient ischemic attack (TIA), and cerebral infarction without residual deficits: Secondary | ICD-10-CM | POA: Diagnosis not present

## 2024-06-05 DIAGNOSIS — E785 Hyperlipidemia, unspecified: Secondary | ICD-10-CM | POA: Diagnosis not present

## 2024-06-05 DIAGNOSIS — R296 Repeated falls: Secondary | ICD-10-CM | POA: Diagnosis not present

## 2024-06-05 DIAGNOSIS — I10 Essential (primary) hypertension: Secondary | ICD-10-CM | POA: Diagnosis not present

## 2024-06-05 DIAGNOSIS — M21372 Foot drop, left foot: Secondary | ICD-10-CM | POA: Diagnosis not present

## 2024-06-05 DIAGNOSIS — H409 Unspecified glaucoma: Secondary | ICD-10-CM | POA: Diagnosis not present

## 2024-06-05 DIAGNOSIS — Z8673 Personal history of transient ischemic attack (TIA), and cerebral infarction without residual deficits: Secondary | ICD-10-CM | POA: Diagnosis not present

## 2024-06-05 DIAGNOSIS — M62542 Muscle wasting and atrophy, not elsewhere classified, left hand: Secondary | ICD-10-CM | POA: Diagnosis not present

## 2024-06-05 DIAGNOSIS — Z7984 Long term (current) use of oral hypoglycemic drugs: Secondary | ICD-10-CM | POA: Diagnosis not present

## 2024-06-05 DIAGNOSIS — Z9089 Acquired absence of other organs: Secondary | ICD-10-CM | POA: Diagnosis not present

## 2024-06-05 DIAGNOSIS — E119 Type 2 diabetes mellitus without complications: Secondary | ICD-10-CM | POA: Diagnosis not present

## 2024-06-05 DIAGNOSIS — Z794 Long term (current) use of insulin: Secondary | ICD-10-CM | POA: Diagnosis not present

## 2024-06-05 DIAGNOSIS — Z5982 Transportation insecurity: Secondary | ICD-10-CM | POA: Diagnosis not present

## 2024-06-05 DIAGNOSIS — Z9181 History of falling: Secondary | ICD-10-CM | POA: Diagnosis not present

## 2024-06-05 DIAGNOSIS — Z7902 Long term (current) use of antithrombotics/antiplatelets: Secondary | ICD-10-CM | POA: Diagnosis not present

## 2024-06-05 DIAGNOSIS — N400 Enlarged prostate without lower urinary tract symptoms: Secondary | ICD-10-CM | POA: Diagnosis not present

## 2024-06-05 DIAGNOSIS — Z556 Problems related to health literacy: Secondary | ICD-10-CM | POA: Diagnosis not present

## 2024-06-05 DIAGNOSIS — K5900 Constipation, unspecified: Secondary | ICD-10-CM | POA: Diagnosis not present

## 2024-06-05 DIAGNOSIS — Z9049 Acquired absence of other specified parts of digestive tract: Secondary | ICD-10-CM | POA: Diagnosis not present

## 2024-06-26 DIAGNOSIS — E785 Hyperlipidemia, unspecified: Secondary | ICD-10-CM | POA: Diagnosis not present

## 2024-06-26 DIAGNOSIS — I10 Essential (primary) hypertension: Secondary | ICD-10-CM | POA: Diagnosis not present

## 2024-06-26 DIAGNOSIS — N400 Enlarged prostate without lower urinary tract symptoms: Secondary | ICD-10-CM | POA: Diagnosis not present

## 2024-06-26 DIAGNOSIS — E119 Type 2 diabetes mellitus without complications: Secondary | ICD-10-CM | POA: Diagnosis not present
